# Patient Record
Sex: Male | Born: 1935 | Race: White | Hispanic: No | Marital: Married | Smoking: Never smoker
Health system: Southern US, Community
[De-identification: ages and names within clinical notes are randomized; demographics above are authoritative.]

## PROBLEM LIST (undated history)

## (undated) ENCOUNTER — Emergency Department (HOSPITAL_COMMUNITY): Admission: EM | Payer: Medicare (Managed Care) | Source: Home / Self Care

## (undated) DIAGNOSIS — F0390 Unspecified dementia without behavioral disturbance: Secondary | ICD-10-CM

## (undated) DIAGNOSIS — A4902 Methicillin resistant Staphylococcus aureus infection, unspecified site: Secondary | ICD-10-CM

## (undated) HISTORY — DX: Unspecified dementia, unspecified severity, without behavioral disturbance, psychotic disturbance, mood disturbance, and anxiety: F03.90

## (undated) HISTORY — DX: Methicillin resistant Staphylococcus aureus infection, unspecified site: A49.02

## (undated) HISTORY — PX: OTHER SURGICAL HISTORY: SHX169

---

## 2016-02-26 ENCOUNTER — Emergency Department (HOSPITAL_COMMUNITY)
Admission: EM | Admit: 2016-02-26 | Discharge: 2016-02-26 | Disposition: A | Discharge status: Home / Self Care | Payer: Medicare Other | Attending: Emergency Medicine | Admitting: Emergency Medicine

## 2016-02-26 ENCOUNTER — Encounter (HOSPITAL_COMMUNITY): Payer: Self-pay | Admitting: Emergency Medicine

## 2016-02-26 ENCOUNTER — Emergency Department (HOSPITAL_COMMUNITY): Payer: Medicare Other

## 2016-02-26 DIAGNOSIS — R410 Disorientation, unspecified: Secondary | ICD-10-CM | POA: Diagnosis present

## 2016-02-26 DIAGNOSIS — F0391 Unspecified dementia with behavioral disturbance: Secondary | ICD-10-CM | POA: Insufficient documentation

## 2016-02-26 DIAGNOSIS — R4689 Other symptoms and signs involving appearance and behavior: Secondary | ICD-10-CM

## 2016-02-26 LAB — URINALYSIS, ROUTINE W REFLEX MICROSCOPIC
BILIRUBIN URINE: NEGATIVE
GLUCOSE, UA: NEGATIVE mg/dL
Hgb urine dipstick: NEGATIVE
KETONES UR: NEGATIVE mg/dL
NITRITE: NEGATIVE
PROTEIN: NEGATIVE mg/dL
Specific Gravity, Urine: 1.009 (ref 1.005–1.030)
pH: 7.5 (ref 5.0–8.0)

## 2016-02-26 LAB — URINE MICROSCOPIC-ADD ON

## 2016-02-26 NOTE — ED Notes (Signed)
Patient family members is waiting to speak with Case Worker.

## 2016-02-26 NOTE — Discharge Instructions (Signed)

## 2016-02-26 NOTE — ED Notes (Addendum)
Patient refused blood work and chest x-ray.    MD is aware

## 2016-02-26 NOTE — ED Notes (Signed)
Patient left without discharge papers.  He left with family members

## 2016-02-26 NOTE — Progress Notes (Signed)
CSW was notified by EDP that daughter is interested in placement.  CSW spoke with daughter on the phone. CSW informed her that [facility]. However, CSW will provide resources for daughter to assist with placing patient from community. CSW educated daughter over the phone and explained to her that [facility] is considered outpatient and that a list of facilities will be provided alone with their contact information and [facility] information.  Daughter expressed understanding. She states that [facility] and she will pick pt up. CSW placed community information in the chart.  Trish MageBrittney Azarian Starace, LCSWA [phone] ED CSW 02/26/2016 4:53 PM

## 2016-02-26 NOTE — ED Notes (Signed)
Pt sts " I absolutely, positively refuse to give my blood.  I just had blood done last week.  I don't know why he wants my blood."  Pt insist he is in good health and also sts he will soon "walk out of here, right out the front door soon."  RN notified.

## 2016-02-26 NOTE — ED Notes (Signed)
Daughter:  Claudean SeveranceBeth Ann Ellen  [phone]  Wife:  Alfonse RasCheryl Ann Kimberley [phone]

## 2016-02-26 NOTE — ED Notes (Signed)
Call was placed to wife and daughter for them to return call to us.  Need more medical information
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
## 2016-02-26 NOTE — ED Notes (Signed)
Per EMs patient comes from home where he lives with daughter for being more confused today than his baseline.  Patient has dementia. Patient was more aggressive with her today and would like him evaluated.

## 2016-02-26 NOTE — Progress Notes (Addendum)
Pt with dementia noted Pt informs CM he does not have a pcp Pt states he is from [place] No other family present in pt room with him Pt able to tell CM he has daughters, "beth" and Elnita MaxwellCheryl" but does not know last names  ED RN does not have location of where EMS picked pt up from  Left a list of medicare providers in pt belonging bag in room with pt for zip code [phone]
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
## 2016-02-27 LAB — URINE CULTURE: Culture: NO GROWTH

## 2016-04-16 ENCOUNTER — Encounter: Payer: Self-pay | Admitting: Neurology

## 2016-04-16 ENCOUNTER — Ambulatory Visit (INDEPENDENT_AMBULATORY_CARE_PROVIDER_SITE_OTHER): Payer: Medicare Other | Admitting: Neurology

## 2016-04-16 VITALS — BP 144/82 | HR 72 | Ht 70.0 in | Wt 171.0 lb

## 2016-04-16 DIAGNOSIS — F03918 Unspecified dementia, unspecified severity, with other behavioral disturbance: Secondary | ICD-10-CM | POA: Insufficient documentation

## 2016-04-16 DIAGNOSIS — R410 Disorientation, unspecified: Secondary | ICD-10-CM

## 2016-04-16 DIAGNOSIS — R251 Tremor, unspecified: Secondary | ICD-10-CM

## 2016-04-16 DIAGNOSIS — W19XXXA Unspecified fall, initial encounter: Secondary | ICD-10-CM

## 2016-04-16 DIAGNOSIS — E538 Deficiency of other specified B group vitamins: Secondary | ICD-10-CM

## 2016-04-16 DIAGNOSIS — F05 Delirium due to known physiological condition: Secondary | ICD-10-CM | POA: Diagnosis not present

## 2016-04-16 DIAGNOSIS — R269 Unspecified abnormalities of gait and mobility: Secondary | ICD-10-CM | POA: Diagnosis not present

## 2016-04-16 DIAGNOSIS — F0391 Unspecified dementia with behavioral disturbance: Secondary | ICD-10-CM

## 2016-04-16 DIAGNOSIS — R270 Ataxia, unspecified: Secondary | ICD-10-CM

## 2016-04-16 MED ORDER — DONEPEZIL HCL 10 MG PO TABS: 10.0000 mg | ORAL_TABLET | Freq: Every day | ORAL | 12 refills | Status: AC
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
## 2016-04-16 NOTE — Patient Instructions (Addendum)
Remember to drink plenty of fluid, eat healthy meals and do not skip any meals. Try to eat protein with a every meal and eat a healthy snack such as fruit or nuts in between meals. Try to keep a regular sleep-wake schedule and try to exercise daily, particularly in the form of walking, 20-30 minutes a day, if you can.   As far as your medications are concerned, I would like to suggest: Aricept. Start with 1/2 a tablet then increase to a whole tablet in 2 weeks if no side effects.   As far as diagnostic testing: MRI brain, labs  I would like to see you back in 4-6 months, sooner if we need to. Please call us with any interim questions, concerns, problems, updates or refill requests.   Our phone number is [phone]. We also have an after hours call service for urgent matters and there is a physician on-call for urgent questions. For any emergencies you know to call 911 or go to the nearest emergency room  Donepezil tablets What is this medicine? DONEPEZIL (doe NEP e zil) is used to treat mild to moderate dementia caused by Alzheimer's disease. This medicine may be used for other purposes; ask your health care provider or pharmacist if you have questions. What should I tell my health care provider before I take this medicine? They need to know if you have any of these conditions: -asthma or other lung disease -difficulty passing urine -head injury -heart disease -history of irregular heartbeat -liver disease -seizures (convulsions) -stomach or intestinal disease, ulcers or stomach bleeding -an unusual or allergic reaction to donepezil, other medicines, foods, dyes, or preservatives -pregnant or trying to get pregnant -breast-feeding How should I use this medicine? Take this medicine by mouth with a glass of water. Follow the directions on the prescription label. You may take this medicine with or without food. Take this medicine at regular intervals. This medicine is usually taken before  bedtime. Do not take it more often than directed. Continue to take your medicine even if you feel better. Do not stop taking except on your doctor's advice. If you are taking the 23 mg donepezil tablet, swallow it whole; do not cut, crush, or chew it. Talk to your pediatrician regarding the use of this medicine in children. Special care may be needed. Overdosage: If you think you have taken too much of this medicine contact a poison control center or emergency room at once. NOTE: This medicine is only for you. Do not share this medicine with others. What if I miss a dose? If you miss a dose, take it as soon as you can. If it is almost time for your next dose, take only that dose, do not take double or extra doses. What may interact with this medicine? Do not take this medicine with any of the following medications: -certain medicines for fungal infections like itraconazole, fluconazole, posaconazole, and voriconazole -cisapride -dextromethorphan; quinidine -dofetilide -dronedarone -pimozide -quinidine -thioridazine -ziprasidone This medicine may also interact with the following medications: -antihistamines for allergy, cough and cold -atropine -bethanechol -carbamazepine -certain medicines for bladder problems like oxybutynin, tolterodine -certain medicines for Parkinson's disease like benztropine, trihexyphenidyl -certain medicines for stomach problems like dicyclomine, hyoscyamine -certain medicines for travel sickness like scopolamine -dexamethasone -ipratropium -NSAIDs, medicines for pain and inflammation, like ibuprofen or naproxen -other medicines for Alzheimer's disease -other medicines that prolong the QT interval (cause an abnormal heart rhythm) -phenobarbital -phenytoin -rifampin, rifabutin or rifapentine This list may not describe all  possible interactions. Give your health care provider a list of all the medicines, herbs, non-prescription drugs, or dietary supplements  you use. Also tell them if you smoke, drink alcohol, or use illegal drugs. Some items may interact with your medicine. What should I watch for while using this medicine? Visit your doctor or health care professional for regular checks on your progress. Check with your doctor or health care professional if your symptoms do not get better or if they get worse. You may get drowsy or dizzy. Do not drive, use machinery, or do anything that needs mental alertness until you know how this drug affects you. What side effects may I notice from receiving this medicine? Side effects that you should report to your doctor or health care professional as soon as possible: -allergic reactions like skin rash, itching or hives, swelling of the face, lips, or tongue -changes in vision -feeling faint or lightheaded, falls -problems with balance -redness, blistering, peeling or loosening of the skin, including inside the mouth -slow heartbeat, or palpitations -stomach pain -unusual bleeding or bruising, red or purple spots on the skin -vomiting -weight loss Side effects that usually do not require medical attention (report to your doctor or health care professional if they continue or are bothersome): -diarrhea, especially when starting treatment -headache -indigestion or heartburn -loss of appetite -muscle cramps -nausea This list may not describe all possible side effects. Call your doctor for medical advice about side effects. You may report side effects to FDA at 1-800-FDA-1088. Where should I keep my medicine? Keep out of reach of children. Store at room temperature between 15 and 30 degrees C (59 and 86 degrees F). Throw away any unused medicine after the expiration date. NOTE: This sheet is a summary. It may not cover all possible information. If you have questions about this medicine, talk to your doctor, pharmacist, or health care provider.    2016, Elsevier/Gold Standard. (2014-03-17 07:51:52)

## 2016-04-18 ENCOUNTER — Telehealth: Payer: Self-pay | Admitting: *Deleted

## 2016-04-18 LAB — COMPREHENSIVE METABOLIC PANEL
ALBUMIN: 4.2 g/dL (ref 3.5–4.7)
ALT: 21 IU/L (ref 0–44)
AST: 25 IU/L (ref 0–40)
Albumin/Globulin Ratio: 1.8 (ref 1.2–2.2)
Alkaline Phosphatase: 77 IU/L (ref 39–117)
BUN / CREAT RATIO: 13 (ref 10–24)
BUN: 16 mg/dL (ref 8–27)
Bilirubin Total: 1 mg/dL (ref 0.0–1.2)
CO2: 23 mmol/L (ref 18–29)
CREATININE: 1.22 mg/dL (ref 0.76–1.27)
Calcium: 9.6 mg/dL (ref 8.6–10.2)
Chloride: 103 mmol/L (ref 96–106)
GFR calc non Af Amer: 54 mL/min/{1.73_m2} — ABNORMAL LOW (ref >59)
GFR, EST AFRICAN AMERICAN: 63 mL/min/{1.73_m2} (ref >59)
GLUCOSE: 97 mg/dL (ref 65–99)
Globulin, Total: 2.3 g/dL (ref 1.5–4.5)
Potassium: 5.4 mmol/L — ABNORMAL HIGH (ref 3.5–5.2)
Sodium: 141 mmol/L (ref 134–144)
TOTAL PROTEIN: 6.5 g/dL (ref 6.0–8.5)

## 2016-04-18 LAB — B12 AND FOLATE PANEL
Folate: 17.6 ng/mL (ref >3.0)
VITAMIN B 12: 351 pg/mL (ref 211–946)

## 2016-04-18 LAB — CBC
HEMOGLOBIN: 14.3 g/dL (ref 12.6–17.7)
Hematocrit: 41 % (ref 37.5–51.0)
MCH: 32.4 pg (ref 26.6–33.0)
MCHC: 34.9 g/dL (ref 31.5–35.7)
MCV: 93 fL (ref 79–97)
Platelets: 232 10*3/uL (ref 150–379)
RBC: 4.41 x10E6/uL (ref 4.14–5.80)
RDW: 14 % (ref 12.3–15.4)
WBC: 5.8 10*3/uL (ref 3.4–10.8)

## 2016-04-18 LAB — THYROID PANEL WITH TSH
Free Thyroxine Index: 2.1 (ref 1.2–4.9)
T3 UPTAKE RATIO: 28 % (ref 24–39)
T4 TOTAL: 7.6 ug/dL (ref 4.5–12.0)
TSH: 1.75 u[IU]/mL (ref 0.450–4.500)

## 2016-04-18 LAB — RPR: RPR Ser Ql: NONREACTIVE

## 2016-04-18 NOTE — Telephone Encounter (Signed)
-----   Message from Antonia B Ahern, MD sent at 04/18/2016  6:07 PM EDT ----- Labs unremarkable thanks 

## 2016-04-18 NOTE — Telephone Encounter (Signed)
LVM for wife, Elnita MaxwellCheryl about unremarkable labs per Dr Lucia GaskinsAhern. Gave [facility] phone number if she has further questions.

## 2016-04-26 ENCOUNTER — Emergency Department (HOSPITAL_COMMUNITY): Payer: Medicare Other

## 2016-04-26 ENCOUNTER — Emergency Department (HOSPITAL_COMMUNITY)
Admission: EM | Admit: 2016-04-26 | Discharge: 2016-04-26 | Disposition: A | Discharge status: Home / Self Care | Payer: Medicare Other | Attending: Emergency Medicine | Admitting: Emergency Medicine

## 2016-04-26 ENCOUNTER — Encounter (HOSPITAL_COMMUNITY): Payer: Self-pay | Admitting: Emergency Medicine

## 2016-04-26 DIAGNOSIS — F0390 Unspecified dementia without behavioral disturbance: Secondary | ICD-10-CM | POA: Insufficient documentation

## 2016-04-26 DIAGNOSIS — S4991XA Unspecified injury of right shoulder and upper arm, initial encounter: Secondary | ICD-10-CM | POA: Diagnosis present

## 2016-04-26 DIAGNOSIS — Y999 Unspecified external cause status: Secondary | ICD-10-CM | POA: Insufficient documentation

## 2016-04-26 DIAGNOSIS — Y92410 Unspecified street and highway as the place of occurrence of the external cause: Secondary | ICD-10-CM | POA: Diagnosis not present

## 2016-04-26 DIAGNOSIS — W19XXXA Unspecified fall, initial encounter: Secondary | ICD-10-CM

## 2016-04-26 DIAGNOSIS — Y9301 Activity, walking, marching and hiking: Secondary | ICD-10-CM | POA: Diagnosis not present

## 2016-04-26 DIAGNOSIS — S43004A Unspecified dislocation of right shoulder joint, initial encounter: Secondary | ICD-10-CM

## 2016-04-26 DIAGNOSIS — S43031A Inferior subluxation of right humerus, initial encounter: Secondary | ICD-10-CM | POA: Diagnosis not present

## 2016-04-26 DIAGNOSIS — W0110XA Fall on same level from slipping, tripping and stumbling with subsequent striking against unspecified object, initial encounter: Secondary | ICD-10-CM | POA: Diagnosis not present

## 2016-04-26 LAB — CBC WITH DIFFERENTIAL/PLATELET
BASOS PCT: 1 %
Basophils Absolute: 0 10*3/uL (ref 0.0–0.1)
EOS ABS: 0.2 10*3/uL (ref 0.0–0.7)
Eosinophils Relative: 4 %
HCT: 38.5 % — ABNORMAL LOW (ref 39.0–52.0)
HEMOGLOBIN: 13.5 g/dL (ref 13.0–17.0)
LYMPHS PCT: 34 %
Lymphs Abs: 2.2 10*3/uL (ref 0.7–4.0)
MCH: 32.4 pg (ref 26.0–34.0)
MCHC: 35.1 g/dL (ref 30.0–36.0)
MCV: 92.3 fL (ref 78.0–100.0)
Monocytes Absolute: 0.6 10*3/uL (ref 0.1–1.0)
Monocytes Relative: 10 %
NEUTROS ABS: 3.3 10*3/uL (ref 1.7–7.7)
Neutrophils Relative %: 51 %
PLATELETS: 230 10*3/uL (ref 150–400)
RBC: 4.17 MIL/uL — ABNORMAL LOW (ref 4.22–5.81)
RDW: 13.1 % (ref 11.5–15.5)
WBC: 6.4 10*3/uL (ref 4.0–10.5)

## 2016-04-26 LAB — COMPREHENSIVE METABOLIC PANEL
ALBUMIN: 3.6 g/dL (ref 3.5–5.0)
ALT: 27 U/L (ref 17–63)
ANION GAP: 8 (ref 5–15)
AST: 41 U/L (ref 15–41)
Alkaline Phosphatase: 65 U/L (ref 38–126)
BUN: 14 mg/dL (ref 6–20)
CALCIUM: 9 mg/dL (ref 8.9–10.3)
CHLORIDE: 111 mmol/L (ref 101–111)
CO2: 22 mmol/L (ref 22–32)
Creatinine, Ser: 1.31 mg/dL — ABNORMAL HIGH (ref 0.61–1.24)
GFR calc non Af Amer: 49 mL/min — ABNORMAL LOW (ref >60)
GFR, EST AFRICAN AMERICAN: 56 mL/min — AB (ref >60)
GLUCOSE: 133 mg/dL — AB (ref 65–99)
POTASSIUM: 3.8 mmol/L (ref 3.5–5.1)
SODIUM: 141 mmol/L (ref 135–145)
Total Bilirubin: 2 mg/dL — ABNORMAL HIGH (ref 0.3–1.2)
Total Protein: 5.7 g/dL — ABNORMAL LOW (ref 6.5–8.1)

## 2016-04-26 LAB — I-STAT TROPONIN, ED: Troponin i, poc: 0 ng/mL (ref 0.00–0.08)

## 2016-04-26 LAB — ETHANOL

## 2016-04-26 MED ORDER — ONDANSETRON HCL 4 MG/2ML IJ SOLN
4.0000 mg | Freq: Once | INTRAMUSCULAR | Status: AC
Start: 2016-04-26 — End: 2016-04-26
  Administered 2016-04-26: 4 mg via INTRAVENOUS
  Filled 2016-04-26: qty 2

## 2016-04-26 MED ORDER — HYDROMORPHONE HCL 1 MG/ML IJ SOLN
1.0000 mg | Freq: Once | INTRAMUSCULAR | Status: AC
  Administered 2016-04-26: 1 mg via INTRAVENOUS
  Filled 2016-04-26: qty 1

## 2016-04-26 MED ORDER — HYDROCODONE-ACETAMINOPHEN 5-325 MG PO TABS: 1.0000 | ORAL_TABLET | ORAL | 0 refills | Status: DC | PRN

## 2016-04-26 MED ORDER — PROPOFOL 10 MG/ML IV BOLUS
100.0000 mg | Freq: Once | INTRAVENOUS | Status: AC
  Administered 2016-04-26: 50 mg via INTRAVENOUS
  Filled 2016-04-26: qty 20

## 2016-04-26 MED ORDER — PROPOFOL 10 MG/ML IV BOLUS
INTRAVENOUS | Status: AC | PRN
  Administered 2016-04-26: 50 mg via INTRAVENOUS

## 2016-04-26 MED ORDER — DIAZEPAM 5 MG/ML IJ SOLN
5.0000 mg | Freq: Once | INTRAMUSCULAR | Status: DC
Start: 2016-04-26 — End: 2016-04-26
  Filled 2016-04-26: qty 2

## 2016-04-26 MED ORDER — LIDOCAINE HCL 2 % IJ SOLN
10.0000 mL | Freq: Once | INTRAMUSCULAR | Status: DC
  Filled 2016-04-26: qty 20

## 2016-04-26 NOTE — ED Triage Notes (Signed)
Pt fell outside while walking to a neighbors house per pt. Pt arrives with deformity of upper right arm and pain. Pt has sensation intact to right hand with limited movement of fingers. Doppler pulse present in right radial.

## 2016-04-26 NOTE — ED Notes (Signed)
Family at bedside leaving and would like to be contacted for any updates Elnita MaxwellCheryl [phone]

## 2016-04-26 NOTE — Discharge Instructions (Signed)
Take motrin for pain.   Apply ice to area.   Take vicodin for severe pain.   Use sling at all times for a week until you see ortho.  Call Dr. Warren DanesXu's office for appointment.   Return to ER if you have severe pain, another dislocation.

## 2016-04-26 NOTE — ED Provider Notes (Addendum)
[facility]-EMERGENCY DEPT Provider Note   CSN: [id number] Arrival date & time: 04/26/16  1211     History   Chief Complaint Chief Complaint  Patient presents with  . Fall  . Shoulder Injury    HPI Rochelle Nephew is a 82 y.o. male hx of dementia here with fall. Patient states that he was walking outside and tripped over the curb and hit the right arm and his head. He states that he Did hit his head and is complaining of right upper arm pain and forearm pain. Denies passing out. Denies any chest pain or abdominal pain. States it is otherwise healthy and only has a history of dementia. Denies any alcohol or drug use.   The history is provided by the patient.    Past Medical History:  Diagnosis Date  . Dementia   . MRSA infection     Patient Active Problem List   Diagnosis Date Noted  . Dementia with behavioral disturbance 04/16/2016    Past Surgical History:  Procedure Laterality Date  . MRSa     2006/2007  . OTHER SURGICAL HISTORY     Dicverticulitis       Home Medications    Prior to Admission medications   Medication Sig Start Date End Date Taking? Authorizing Provider  donepezil (ARICEPT) 10 MG tablet Take 1 tablet (10 mg total) by mouth at bedtime. 04/16/16   Anson Fret, MD  levocetirizine (XYZAL) 5 MG tablet Take 5 mg by mouth daily. 04/08/16   Historical Provider, MD  triamcinolone cream (KENALOG) 0.1 % Apply 1 application topically daily. 03/23/16   Historical Provider, MD    Family History Family History  Problem Relation Age of Onset  . Glaucoma Mother     Social History Social History  Substance Use Topics  . Smoking status: Never Smoker  . Smokeless tobacco: Never Used  . Alcohol use No     Allergies   Review of patient's allergies indicates no known allergies.   Review of Systems Review of Systems  Musculoskeletal:       R upper arm pain   Neurological: Positive for headaches.  All other systems reviewed and are negative.    Physical  Exam Updated Vital Signs BP 155/90   Pulse 78   Resp 18   Ht 5\' 10"  (1.778 m)   Wt 171 lb (77.6 kg)   SpO2 97%   BMI 24.54 kg/m   Physical Exam  Constitutional:  Uncomfortable   HENT:  Head: Normocephalic.  Eyes: EOM are normal. Pupils are equal, round, and reactive to light.  Neck: Normal range of motion. Neck supple.  Towel roll in place   Cardiovascular: Normal rate, regular rhythm and normal heart sounds.   Pulmonary/Chest: Effort normal and breath sounds normal. No respiratory distress. He has no wheezes. He has no rales.  Abdominal: Soft. Bowel sounds are normal.  Musculoskeletal:  R proximal humerus deformity, mild  R forearm tenderness, no obvious deformity. Able to hand grasp. 2+ pulses.   Neurological: He is alert.  Skin: Skin is warm.  Psychiatric: He has a normal mood and affect.  Nursing note and vitals reviewed.    ED Treatments / Results  Labs (all labs ordered are listed, but only abnormal results are displayed) Labs Reviewed  CBC WITH DIFFERENTIAL/PLATELET - Abnormal; Notable for the following:       Result Value   RBC 4.17 (*)    HCT 38.5 (*)    All other components within normal  limits  COMPREHENSIVE METABOLIC PANEL - Abnormal; Notable for the following:    Glucose, Bld 133 (*)    Creatinine, Ser 1.31 (*)    Total Protein 5.7 (*)    Total Bilirubin 2.0 (*)    GFR calc non Af Amer 49 (*)    GFR calc Af Amer 56 (*)    All other components within normal limits  ETHANOL  I-STAT TROPOININ, ED    EKG  EKG Interpretation  Date/Time:  Friday April 26 2016 12:15:33 EDT Ventricular Rate:  65 PR Interval:    QRS Duration: 115 QT Interval:  437 QTC Calculation: 455 R Axis:   46 Text Interpretation:  Sinus rhythm Prolonged PR interval Nonspecific intraventricular conduction delay Low voltage, precordial leads Borderline T abnormalities, anterior leads No previous ECGs available Confirmed by Candia Kingsbury  MD, Jla Reynolds ([id number]) on 04/26/2016 1:38:33 PM        Radiology Dg Chest 1 View  Result Date: 04/26/2016 CLINICAL DATA:  Fall, right humerus pain EXAM: CHEST 1 VIEW COMPARISON:  None. FINDINGS: Cardiomediastinal silhouette is unremarkable. No infiltrate or pulmonary edema. No pneumothorax. There is anterior subluxation of right humerus from glenohumeral joint. IMPRESSION: No infiltrate or pulmonary edema. Anterior subluxation of right humeral head. Electronically Signed   By: Natasha MeadLiviu  Pop M.D.   On: 04/26/2016 15:35   Dg Pelvis 1-2 Views  Result Date: 04/26/2016 CLINICAL DATA:  Fall. EXAM: PELVIS - 1-2 VIEW COMPARISON:  None. FINDINGS: There is no evidence of pelvic fracture or diastasis. No pelvic bone lesions are seen. Bilateral hip and sacroiliac joints appear normal. IMPRESSION: Normal pelvis. Electronically Signed   By: Lupita RaiderJames  Green Jr, M.D.   On: 04/26/2016 14:57   Dg Shoulder 1v Right  Result Date: 04/26/2016 CLINICAL DATA:  Status post reduction EXAM: RIGHT SHOULDER - 1 VIEW COMPARISON:  Film from earlier in the same day FINDINGS: The previously seen dislocation has been reduced. Degenerative changes of the acromioclavicular joint are noted. No definitive fracture is seen. IMPRESSION: Status post reduction of previous dislocation. Electronically Signed   By: Alcide CleverMark  Lukens M.D.   On: 04/26/2016 15:41   Dg Forearm Right  Result Date: 04/26/2016 CLINICAL DATA:  Pt c/o proximal right humerus pain and generalized right forearm pain after falling outside of his neighbor's house today; pt does not remember how he fell. No hx of prior injuries to the humerus and forearm. Best obtainable im.*comment was truncated* EXAM: RIGHT FOREARM - 2 VIEW COMPARISON:  None. FINDINGS: Ten No fracture of the radius or ulna. The elbow joint and the wrist joint appear normal on two views. IMPRESSION: No fracture or dislocation. Electronically Signed   By: Genevive BiStewart  Edmunds M.D.   On: 04/26/2016 14:56   Ct Head Wo Contrast  Result Date: 04/26/2016 CLINICAL DATA:  Fall  found down in road patient does not know anything keeps asking same questions over. Pt moved during c-spine exam, repeated images.; EXAM: CT HEAD WITHOUT CONTRAST CT CERVICAL SPINE WITHOUT CONTRAST TECHNIQUE: Multidetector CT imaging of the head and cervical spine was performed following the standard protocol without intravenous contrast. Multiplanar CT image reconstructions of the cervical spine were also generated. COMPARISON:  None. FINDINGS: CT HEAD FINDINGS Brain: No acute intracranial hemorrhage. No focal mass lesion. No CT evidence of acute infarction. No midline shift or mass effect. No hydrocephalus. Basilar cisterns are patent. There are periventricular and subcortical white matter hypodensities. Generalized cortical atrophy. Vascular: Vascular calcification of the circle Willis vessels. Skull: No fracture Sinuses/Orbits: Paranasal sinuses  and mastoid air cells are clear. Orbits are clear. Other: No extracranial trauma CT CERVICAL SPINE FINDINGS Alignment: Normal alignment of the cervical vertebral bodies. Skull base and vertebrae: No loss of vertebral body height or disc height. Normal facet articulation. Normal craniocervical junction. Soft tissues and spinal canal: No epidural or paraspinal hematoma. Disc levels: Disc osteophytic disease at C5 through C7 with endplate spurring. Upper chest: Not imaged Other: None IMPRESSION: 1. No intracranial trauma. 2. Atrophy white matter microvascular disease. 3. No cervical spine fracture. 4. Mild disc osteophytic disease. Electronically Signed   By: Genevive Bi M.D.   On: 04/26/2016 14:33   Ct Cervical Spine Wo Contrast  Result Date: 04/26/2016 CLINICAL DATA:  Fall found down in road patient does not know anything keeps asking same questions over. Pt moved during c-spine exam, repeated images.; EXAM: CT HEAD WITHOUT CONTRAST CT CERVICAL SPINE WITHOUT CONTRAST TECHNIQUE: Multidetector CT imaging of the head and cervical spine was performed following the  standard protocol without intravenous contrast. Multiplanar CT image reconstructions of the cervical spine were also generated. COMPARISON:  None. FINDINGS: CT HEAD FINDINGS Brain: No acute intracranial hemorrhage. No focal mass lesion. No CT evidence of acute infarction. No midline shift or mass effect. No hydrocephalus. Basilar cisterns are patent. There are periventricular and subcortical white matter hypodensities. Generalized cortical atrophy. Vascular: Vascular calcification of the circle Willis vessels. Skull: No fracture Sinuses/Orbits: Paranasal sinuses and mastoid air cells are clear. Orbits are clear. Other: No extracranial trauma CT CERVICAL SPINE FINDINGS Alignment: Normal alignment of the cervical vertebral bodies. Skull base and vertebrae: No loss of vertebral body height or disc height. Normal facet articulation. Normal craniocervical junction. Soft tissues and spinal canal: No epidural or paraspinal hematoma. Disc levels: Disc osteophytic disease at C5 through C7 with endplate spurring. Upper chest: Not imaged Other: None IMPRESSION: 1. No intracranial trauma. 2. Atrophy white matter microvascular disease. 3. No cervical spine fracture. 4. Mild disc osteophytic disease. Electronically Signed   By: Genevive Bi M.D.   On: 04/26/2016 14:33   Dg Humerus Right  Result Date: 04/26/2016 CLINICAL DATA:  Proximal right humerus pain.  Fall. EXAM: RIGHT HUMERUS - 2+ VIEW COMPARISON:  None. FINDINGS: There is been dislocation of the glenohumeral joint. The humeral head is inferior to the glenoid. Based on the projections provided is difficult to ascertain whether not this is an anterior posterior dislocation. A transscapular Y view may be helpful to determine location of the humeral head with respect to the glenoid. IMPRESSION: 1. Inferior dislocation of the glenohumeral joint. Electronically Signed   By: Signa Kell M.D.   On: 04/26/2016 14:57    Procedures Reduction of dislocation Date/Time:  04/26/2016 3:42 PM Performed by: Charlynne Pander Authorized by: Charlynne Pander  Consent: Verbal consent not obtained. Risks and benefits: risks, benefits and alternatives were discussed Consent given by: patient Patient understanding: patient does not state understanding of the procedure being performed Patient consent: the patient's understanding of the procedure does not match consent given Procedure consent: procedure consent does not match procedure scheduled Relevant documents: relevant documents not present or verified Test results: test results not available Required items: required blood products, implants, devices, and special equipment available Patient identity confirmed: verbally with patient Time out: Immediately prior to procedure a "time out" was called to verify the correct patient, procedure, equipment, support staff and site/side marked as required. Local anesthesia used: no  Anesthesia: Local anesthesia used: no  Sedation: Patient sedated: yes Sedatives: propofol  Vitals: Vital signs were monitored during sedation. Patient tolerance: Patient tolerated the procedure well with no immediate complications Comments: Traction and counter traction performed. No complications. Confirmed by xrays.     (including critical care time)  Medications Ordered in ED Medications  lidocaine (XYLOCAINE) 2 % (with pres) injection 200 mg (200 mg Other Not Given 04/26/16 1543)  HYDROmorphone (DILAUDID) injection 1 mg (1 mg Intravenous Given 04/26/16 1220)  ondansetron (ZOFRAN) injection 4 mg (4 mg Intravenous Given 04/26/16 1331)  propofol (DIPRIVAN) 10 mg/mL bolus/IV push 100 mg (50 mg Intravenous Given by Other 04/26/16 1542)  ondansetron (ZOFRAN) injection 4 mg (4 mg Intravenous Given 04/26/16 1521)  propofol (DIPRIVAN) 10 mg/mL bolus/IV push (50 mg Intravenous Given 04/26/16 1528)     Initial Impression / Assessment and Plan / ED Course  I have reviewed the triage vital signs and the  nursing notes.  Pertinent labs & imaging results that were available during my care of the patient were reviewed by me and considered in my medical decision making (see chart for details).  Clinical Course    Arcadio Cope is a 82 y.o. male here with fall. No syncope. Will get labs, CT head/neck, xrays.   3:50 PM  Xray showed R shoulder inferior dislocation. I reduced R shoulder under conscious sedation. Xray showed successful reduction. Shoulder immobilizer placed. Will give ortho follow up. Awake alert now. Able to hand grasp, neurovascular intact. States that has no place to go and he is visiting. Case management to give him list of shelters.    Final Clinical Impressions(s) / ED Diagnoses   Final diagnoses:  Fall  Shoulder dislocation, right, initial encounter    New Prescriptions New Prescriptions   No medications on file     Charlynne Pander, MD 04/26/16 1552    Charlynne Pander, MD 04/26/16 1558

## 2016-04-26 NOTE — ED Notes (Addendum)
Pt belongings (pants and wallet) given to daughter.

## 2016-04-26 NOTE — Progress Notes (Signed)
Orthopedic Tech Progress Note Patient Details:  Jacob Rich 04/13/1931 [id number]  Ortho Devices Type of Ortho Device: Shoulder immobilizer Ortho Device/Splint Location: RUE Ortho Device/Splint Interventions: Ordered, Application   Jacob Rich, Jacob Rich 04/26/2016, 3:41 PM

## 2016-05-03 ENCOUNTER — Emergency Department (HOSPITAL_COMMUNITY)
Admission: EM | Admit: 2016-05-03 | Discharge: 2016-05-03 | Disposition: A | Discharge status: Home / Self Care | Payer: Medicare Other | Attending: Emergency Medicine | Admitting: Emergency Medicine

## 2016-05-03 ENCOUNTER — Emergency Department (HOSPITAL_COMMUNITY): Payer: Medicare Other

## 2016-05-03 ENCOUNTER — Encounter (HOSPITAL_COMMUNITY): Payer: Self-pay | Admitting: Emergency Medicine

## 2016-05-03 DIAGNOSIS — N390 Urinary tract infection, site not specified: Secondary | ICD-10-CM | POA: Insufficient documentation

## 2016-05-03 DIAGNOSIS — F0390 Unspecified dementia without behavioral disturbance: Secondary | ICD-10-CM | POA: Diagnosis not present

## 2016-05-03 DIAGNOSIS — Z791 Long term (current) use of non-steroidal anti-inflammatories (NSAID): Secondary | ICD-10-CM | POA: Diagnosis not present

## 2016-05-03 DIAGNOSIS — R8290 Unspecified abnormal findings in urine: Secondary | ICD-10-CM | POA: Diagnosis present

## 2016-05-03 LAB — URINALYSIS, ROUTINE W REFLEX MICROSCOPIC
Glucose, UA: NEGATIVE mg/dL
Hgb urine dipstick: NEGATIVE
Ketones, ur: 15 mg/dL — AB
NITRITE: NEGATIVE
Protein, ur: NEGATIVE mg/dL
SPECIFIC GRAVITY, URINE: 1.026 (ref 1.005–1.030)
pH: 6 (ref 5.0–8.0)

## 2016-05-03 LAB — COMPREHENSIVE METABOLIC PANEL
ALBUMIN: 3.9 g/dL (ref 3.5–5.0)
ALK PHOS: 61 U/L (ref 38–126)
ALT: 31 U/L (ref 17–63)
ANION GAP: 7 (ref 5–15)
AST: 35 U/L (ref 15–41)
BUN: 25 mg/dL — AB (ref 6–20)
CALCIUM: 9.3 mg/dL (ref 8.9–10.3)
CO2: 25 mmol/L (ref 22–32)
CREATININE: 1.42 mg/dL — AB (ref 0.61–1.24)
Chloride: 109 mmol/L (ref 101–111)
GFR calc non Af Amer: 44 mL/min — ABNORMAL LOW (ref >60)
GFR, EST AFRICAN AMERICAN: 51 mL/min — AB (ref >60)
GLUCOSE: 145 mg/dL — AB (ref 65–99)
Potassium: 4.1 mmol/L (ref 3.5–5.1)
SODIUM: 141 mmol/L (ref 135–145)
TOTAL PROTEIN: 6.6 g/dL (ref 6.5–8.1)
Total Bilirubin: 2.2 mg/dL — ABNORMAL HIGH (ref 0.3–1.2)

## 2016-05-03 LAB — CBC WITH DIFFERENTIAL/PLATELET
Basophils Absolute: 0 10*3/uL (ref 0.0–0.1)
Basophils Relative: 0 %
EOS ABS: 0.2 10*3/uL (ref 0.0–0.7)
Eosinophils Relative: 3 %
HCT: 37.9 % — ABNORMAL LOW (ref 39.0–52.0)
HEMOGLOBIN: 13.4 g/dL (ref 13.0–17.0)
LYMPHS ABS: 1.2 10*3/uL (ref 0.7–4.0)
Lymphocytes Relative: 18 %
MCH: 33.1 pg (ref 26.0–34.0)
MCHC: 35.4 g/dL (ref 30.0–36.0)
MCV: 93.6 fL (ref 78.0–100.0)
Monocytes Absolute: 0.5 10*3/uL (ref 0.1–1.0)
Monocytes Relative: 8 %
NEUTROS PCT: 71 %
Neutro Abs: 4.5 10*3/uL (ref 1.7–7.7)
Platelets: 273 10*3/uL (ref 150–400)
RBC: 4.05 MIL/uL — AB (ref 4.22–5.81)
RDW: 12.9 % (ref 11.5–15.5)
WBC: 6.3 10*3/uL (ref 4.0–10.5)

## 2016-05-03 LAB — URINE MICROSCOPIC-ADD ON: RBC / HPF: NONE SEEN RBC/hpf (ref 0–5)

## 2016-05-03 MED ORDER — CEPHALEXIN 500 MG PO CAPS: 500.0000 mg | ORAL_CAPSULE | Freq: Three times a day (TID) | ORAL | 0 refills | Status: AC

## 2016-05-03 NOTE — ED Notes (Signed)
Cannot reach family member to assess reason for visit.

## 2016-05-03 NOTE — ED Notes (Signed)
MD spoke with family.  New orders given.

## 2016-05-03 NOTE — ED Notes (Signed)
Bed: [id number] Expected date:  Expected time:  Means of arrival:  Comments: Dark urine

## 2016-05-03 NOTE — ED Notes (Signed)
Pt reminded of need for urine. Pt states " I only pee twice a day and I already done that for the day."

## 2016-05-03 NOTE — ED Triage Notes (Signed)
Pt's wife called [facility] reporting dark urine since yesterday.  Pt has hx of UTIs and dementia.  Pt A&O at baseline.  Pt's wife gave him Vicodin 5/325 mg 1hr before [facility] arrived.  Pt denies N/V, pain or burning with urination, frequency, retention, or abdominal/back/pelvic pain.  Pt reports that he is worried about pain in his right hip which he isn't sure whether it was damaged 'during the accident along with my shoulder'.  Pt's wife reports that he fell and dislocated his shoulder a few weeks ago.  Pt's right arm is in a sling.

## 2016-05-03 NOTE — ED Notes (Signed)
Will obtain VS when pt ride arrives

## 2016-05-03 NOTE — ED Notes (Signed)
Pt given sandwhich 

## 2016-05-03 NOTE — ED Provider Notes (Signed)
[facility]-EMERGENCY DEPT Provider Note   CSN: [id number] Arrival date & time: 05/03/16  1317     History   Chief Complaint Chief Complaint  Patient presents with  . Dysuria    HPI Jacob Rich is a 82 y.o. male.  HPI   Patient reports not sure why he is here.  Denies pain.  No pain with urination. Reports "I'm healthy". Denies abdominal pain, chest pain, shortness of breath. Reports "i'm in perfect condition!"  Wife brought him in for concern for dark urine.   1 week ago fell and dislocated shoulder Not walking for last few days, has been since yesterday having hip pain (has been bothering him off and on for years) in bed until 1PM, laying in bed, not wanting to walk at home.  Urine appeared dark. Looked pale at the time.  Wife concerned that he has UTI  Past Medical History:  Diagnosis Date  . Dementia   . MRSA infection     Patient Active Problem List   Diagnosis Date Noted  . Dementia with behavioral disturbance 04/16/2016    Past Surgical History:  Procedure Laterality Date  . MRSa     2006/2007  . OTHER SURGICAL HISTORY     Dicverticulitis       Home Medications    Prior to Admission medications   Medication Sig Start Date End Date Taking? Authorizing Provider  cephALEXin (KEFLEX) 500 MG capsule Take 1 capsule (500 mg total) by mouth 3 (three) times daily. 05/03/16 05/10/16  Alvira MondayErin Gaelyn Tukes, MD  donepezil (ARICEPT) 10 MG tablet Take 1 tablet (10 mg total) by mouth at bedtime. 04/16/16   Anson FretAntonia B Ahern, MD  HYDROcodone-acetaminophen (NORCO/VICODIN) 5-325 MG tablet Take 1 tablet by mouth every 4 (four) hours as needed. 04/26/16   Charlynne Panderavid Hsienta Yao, MD  levocetirizine (XYZAL) 5 MG tablet Take 5 mg by mouth daily. 04/08/16   Historical Provider, MD  triamcinolone cream (KENALOG) 0.1 % Apply 1 application topically daily. 03/23/16   Historical Provider, MD    Family History Family History  Problem Relation Age of Onset  . Glaucoma Mother     Social  History Social History  Substance Use Topics  . Smoking status: Never Smoker  . Smokeless tobacco: Never Used  . Alcohol use No     Allergies   Review of patient's allergies indicates no known allergies.   Review of Systems Review of Systems  Unable to perform ROS: Dementia  Constitutional: Positive for fatigue (per wife). Negative for fever.  HENT: Negative for sore throat.   Eyes: Negative for visual disturbance.  Respiratory: Negative for shortness of breath.   Cardiovascular: Negative for chest pain.  Gastrointestinal: Negative for abdominal pain.  Genitourinary: Negative for difficulty urinating. Hematuria: dark urine per wife.  Musculoskeletal: Positive for arthralgias (per wife). Negative for neck stiffness.  Skin: Negative for rash.  Neurological: Negative for syncope and headaches.     Physical Exam Updated Vital Signs BP 129/63 (BP Location: Left Arm)   Pulse (!) 51   Temp 97.6 F (36.4 C) (Oral)   Resp 14   Ht 5\' 10"  (1.778 m)   Wt 171 lb (77.6 kg)   SpO2 100%   BMI 24.54 kg/m   Physical Exam  Constitutional: He appears well-developed and well-nourished. No distress.  talkative  HENT:  Head: Normocephalic and atraumatic.  Eyes: Conjunctivae and EOM are normal.  Neck: Normal range of motion.  Cardiovascular: Normal rate, regular rhythm, normal heart sounds and intact distal  pulses.  Exam reveals no gallop and no friction rub.   No murmur heard. Pulmonary/Chest: Effort normal and breath sounds normal. No respiratory distress. He has no wheezes. He has no rales.  Abdominal: Soft. He exhibits no distension. There is no tenderness. There is no guarding.  Musculoskeletal: He exhibits no edema.  Neurological: He is alert.  Knows name, Reports in Like an office building, not sure where, reports year is the 13th.   Skin: Skin is warm and dry. He is not diaphoretic.  Nursing note and vitals reviewed.    ED Treatments / Results  Labs (all labs ordered  are listed, but only abnormal results are displayed) Labs Reviewed  URINALYSIS, ROUTINE W REFLEX MICROSCOPIC (NOT AT [facility]) - Abnormal; Notable for the following:       Result Value   Color, Urine AMBER (*)    Bilirubin Urine SMALL (*)    Ketones, ur 15 (*)    Leukocytes, UA MODERATE (*)    All other components within normal limits  CBC WITH DIFFERENTIAL/PLATELET - Abnormal; Notable for the following:    RBC 4.05 (*)    HCT 37.9 (*)    All other components within normal limits  COMPREHENSIVE METABOLIC PANEL - Abnormal; Notable for the following:    Glucose, Bld 145 (*)    BUN 25 (*)    Creatinine, Ser 1.42 (*)    Total Bilirubin 2.2 (*)    GFR calc non Af Amer 44 (*)    GFR calc Af Amer 51 (*)    All other components within normal limits  URINE MICROSCOPIC-ADD ON - Abnormal; Notable for the following:    Squamous Epithelial / LPF 0-5 (*)    Bacteria, UA RARE (*)    Casts HYALINE CASTS (*)    All other components within normal limits    EKG  EKG Interpretation None       Radiology Dg Hip Unilat W Or Wo Pelvis 2-3 Views Right  Result Date: 05/03/2016 CLINICAL DATA:  Right hip pain EXAM: DG HIP (WITH OR WITHOUT PELVIS) 2-3V RIGHT COMPARISON:  None. FINDINGS: There is no evidence of hip fracture or dislocation. There is no evidence of arthropathy or other focal bone abnormality. Mild osteoarthritis of the sacroiliac joints. IMPRESSION: Negative. Electronically Signed   By: Elige Ko   On: 05/03/2016 16:18    Procedures Procedures (including critical care time)  Medications Ordered in ED Medications - No data to display   Initial Impression / Assessment and Plan / ED Course  I have reviewed the triage vital signs and the nursing notes.  Pertinent labs & imaging results that were available during my care of the patient were reviewed by me and considered in my medical decision making (see chart for details).  Clinical Course   82year-old male  With a history of  dementiapresents with concern fordark urine.. Wife reports that he is been fatigued at home.In the emergency department,patient appears have good and energy, is standing up and walking around the room, and have no complaints.  Her wife, patient complained of right hip pain at home, and x-raywas obtained which showed no evidence of fracture. Labs were obtained given wife description of fatigue at homeand showed no acute findings. Urinalysis does show concern for possible UTI.Will treat with keflex as an outpatient. Patient discharged in stable condition with understanding of reasons to return.   Final Clinical Impressions(s) / ED Diagnoses   Final diagnoses:  UTI (lower urinary tract infection)  New Prescriptions Discharge Medication List as of 05/03/2016  6:22 PM    START taking these medications   Details  cephALEXin (KEFLEX) 500 MG capsule Take 1 capsule (500 mg total) by mouth 3 (three) times daily., Starting Fri 05/03/2016, Until Fri 05/10/2016, Print         Alvira Monday, MD 05/04/16 1154

## 2016-05-03 NOTE — ED Notes (Signed)
Spoke with "wife".  Notified pt is d/c and needs ride home.

## 2016-05-03 NOTE — ED Notes (Signed)
Tried to obtain VS he stated " why, I'm not a pt."

## 2016-05-03 NOTE — ED Notes (Signed)
Pt given urinal and made aware of need for urine specimen 

## 2016-05-08 ENCOUNTER — Other Ambulatory Visit: Payer: Medicare Other

## 2016-05-15 ENCOUNTER — Other Ambulatory Visit: Payer: Medicare Other

## 2016-05-29 ENCOUNTER — Other Ambulatory Visit: Payer: Medicare Other

## 2016-06-06 ENCOUNTER — Ambulatory Visit (INDEPENDENT_AMBULATORY_CARE_PROVIDER_SITE_OTHER): Payer: Medicare Other | Admitting: Orthopaedic Surgery

## 2016-06-10 ENCOUNTER — Ambulatory Visit (INDEPENDENT_AMBULATORY_CARE_PROVIDER_SITE_OTHER): Payer: Medicare Other | Admitting: Orthopaedic Surgery

## 2016-06-10 DIAGNOSIS — S4291XD Fracture of right shoulder girdle, part unspecified, subsequent encounter for fracture with routine healing: Secondary | ICD-10-CM

## 2016-06-10 DIAGNOSIS — S4290XD Fracture of unspecified shoulder girdle, part unspecified, subsequent encounter for fracture with routine healing: Secondary | ICD-10-CM | POA: Insufficient documentation

## 2016-06-10 NOTE — Progress Notes (Signed)
   Office Visit Note   Patient: Jacob Rich           Date of Birth: 10/30/1930           MRN: [id number] Visit Date: 06/10/2016              Requested by: No referring provider defined for this encounter. PCP: No PCP Per Patient   Assessment & Plan: Visit Diagnoses:  1. Traumatic closed displaced fracture of right shoulder with anterior dislocation with routine healing, subsequent encounter     Plan:  - continue PT for 6 weeks - needs 3 in 1 commode - will try to treat nonop - consider MRI if not significantly better  Follow-Up Instructions: Return in about 6 weeks (around 07/22/2016) for recheck shoulder.   Orders:  Orders Placed This Encounter  Procedures  . DME Bedside commode   No orders of the defined types were placed in this encounter.     Procedures: No procedures performed   Clinical Data: No additional findings.   Subjective: Chief Complaint  Patient presents with  . Right Shoulder - Follow-up    3 wk f/u for right shoulder dislocation.  Doing PT twice a week.  Doing better per wife.  Pain is 5/10, does not radiate.    Review of Systems   Objective: Vital Signs: There were no vitals taken for this visit.  Physical Exam  Right Shoulder Exam   Comments:  Able to actively abduct 20 degrees but severely limited by pain.        Specialty Comments:  No specialty comments available.  Imaging: No results found.   PMFS History: Patient Active Problem List   Diagnosis Date Noted  . Traumatic closed displaced fracture of shoulder with anterior dislocation with routine healing 06/10/2016  . Dementia with behavioral disturbance 04/16/2016   Past Medical History:  Diagnosis Date  . Dementia   . MRSA infection     Family History  Problem Relation Age of Onset  . Glaucoma Mother     Past Surgical History:  Procedure Laterality Date  . MRSa     2006/2007  . OTHER SURGICAL HISTORY     Dicverticulitis   Social History    Occupational History  . Retired    Social History Main Topics  . Smoking status: Never Smoker  . Smokeless tobacco: Never Used  . Alcohol use No  . Drug use: No  . Sexual activity: Not on file

## 2016-06-21 ENCOUNTER — Telehealth: Payer: Self-pay | Admitting: Neurology

## 2016-06-21 NOTE — Telephone Encounter (Signed)
Kara Meadmma, she can request my last office note in it I wrote " Today's history and physical demonstrated very substantial and measurable cognitive losses consistent with dementia. Based on the substantial degree of impairment it is clear that he does not have the capacity to make informed and appropriate decisions on his healthcare and finances. I do recommend that he lives in a structured setting and has 24-hour care. It is also clear that patient does not comprehend the degree of cognitive losses or the risks that he has been in with falls and self-neglect within the home. On this basis I recommend that a formal guardian of patient's person and financial affairs be put in place,  someone who can continue to look out for the best interests of patient who is suffering from substantial cognitive impairment due to dementia. I had a long discussion with wife. We discussed placement into memory units."   Alternatively we can put this into a separate letter for her. Ask her and she really wants a letter please state he is a patient of mine and copy the above thanks.

## 2016-06-21 NOTE — Telephone Encounter (Signed)
Pt's wife called request letter that states the pt can not make decisions on his own. She is working on [employer].

## 2016-06-21 NOTE — Telephone Encounter (Signed)
Dr Lucia GaskinsAhern- you last saw patient 04/16/16. Are you ok with writing this for them?

## 2016-06-21 NOTE — Telephone Encounter (Signed)
LVM for pt wife. Advised she can request AA, MD last OV note which is very detailed. Will have to sign record release form in office to obtain copy. Advised we are open Monday-Thursday 8-5 and Friday's 8-12pm.

## 2016-06-25 NOTE — Telephone Encounter (Signed)
Called and LVM for wife. Wanted to makes sure she got my message from last week. Gave [facility] phone number. Asked her to call back.

## 2016-06-27 NOTE — Telephone Encounter (Signed)
Called wife back. Advised she cannot write letter. She can pick up office visit note as previously mentioned which has everything they requested. She has to have him sign a record release form to obtain copy. She picked up copy today and stated he had shoulder surgery. Advised to have him sign as best as he can. Use other hand if needed. He has to sign since he is the patient. She will bring back next week and get copy of office note. I advised this is our office policy to obtain records. She verbalized understanding.

## 2016-06-27 NOTE — Telephone Encounter (Signed)
Pt's wife called to advise she would rather have a letter than just the last OV. Please mail to her home address ( I checked the current address, it is correct).

## 2016-07-09 ENCOUNTER — Ambulatory Visit (INDEPENDENT_AMBULATORY_CARE_PROVIDER_SITE_OTHER): Payer: Medicare Other | Admitting: Orthopaedic Surgery

## 2016-07-09 ENCOUNTER — Encounter (INDEPENDENT_AMBULATORY_CARE_PROVIDER_SITE_OTHER): Payer: Self-pay | Admitting: Orthopaedic Surgery

## 2016-07-09 DIAGNOSIS — S4291XD Fracture of right shoulder girdle, part unspecified, subsequent encounter for fracture with routine healing: Secondary | ICD-10-CM | POA: Diagnosis not present

## 2016-07-09 NOTE — Progress Notes (Signed)
   Office Visit Note   Patient: Jacob Rich           Date of Birth: 05/23/1931           MRN: [id number] Visit Date: 07/09/2016              Requested by: No referring provider defined for this encounter. PCP: No PCP Per Patient   Assessment & Plan: Visit Diagnoses: No diagnosis found.  Plan: I discussed with him today that Mr. Biller's shoulder likely has a rotator cuff tear given his age and the shoulder dislocation however he is not a surgical candidate given his severe dementia and low likelihood that the surgery would be successful. I recommend continue physical therapy to help compensate for his rotator cuff tears. I will see him back as needed. Questions encouraged and answered today.  Follow-Up Instructions: Return if symptoms worsen or fail to improve.   Orders:  No orders of the defined types were placed in this encounter.  No orders of the defined types were placed in this encounter.     Procedures: No procedures performed   Clinical Data: No additional findings.   Subjective: Chief Complaint  Patient presents with  . Right Shoulder - Pain    Patient follows up today for his shoulder dislocation and pain. He continues to have pain. Patient does have severe dementia. He is here today with his wife who is providing the details. He has trouble elevating his arm above 35.    Review of Systems   Objective: Vital Signs: There were no vitals taken for this visit.  Physical Exam  Ortho Exam exam of the right shoulder shows a rotator cuff deficient shoulder. However his strength and active range of motion have improved since the previous visit.   Specialty Comments:  No specialty comments available.  Imaging: No results found.   PMFS History: Patient Active Problem List   Diagnosis Date Noted  . Traumatic closed displaced fracture of shoulder with anterior dislocation with routine healing 06/10/2016  . Dementia with behavioral disturbance 04/16/2016     Past Medical History:  Diagnosis Date  . Dementia   . MRSA infection     Family History  Problem Relation Age of Onset  . Glaucoma Mother     Past Surgical History:  Procedure Laterality Date  . MRSa     2006/2007  . OTHER SURGICAL HISTORY     Dicverticulitis   Social History   Occupational History  . Retired    Social History Main Topics  . Smoking status: Never Smoker  . Smokeless tobacco: Never Used  . Alcohol use No  . Drug use: No  . Sexual activity: Not on file

## 2016-07-10 ENCOUNTER — Telehealth (INDEPENDENT_AMBULATORY_CARE_PROVIDER_SITE_OTHER): Payer: Self-pay

## 2016-07-10 NOTE — Telephone Encounter (Signed)
[facility] He needed  Dictation stating for patient to continue physical therapy. Faxed to [facility] to [phone]

## 2016-07-22 ENCOUNTER — Ambulatory Visit (INDEPENDENT_AMBULATORY_CARE_PROVIDER_SITE_OTHER): Payer: Medicare Other | Admitting: Orthopaedic Surgery

## 2016-09-17 ENCOUNTER — Ambulatory Visit: Payer: Medicare Other | Admitting: Adult Health

## 2017-06-17 ENCOUNTER — Encounter (HOSPITAL_COMMUNITY): Payer: Self-pay

## 2017-06-17 ENCOUNTER — Observation Stay (HOSPITAL_COMMUNITY)
Admission: EM | Admit: 2017-06-17 | Discharge: 2017-06-17 | Disposition: A | Discharge status: Home / Self Care | Payer: Medicare (Managed Care) | Attending: Internal Medicine | Admitting: Internal Medicine

## 2017-06-17 DIAGNOSIS — F0391 Unspecified dementia with behavioral disturbance: Secondary | ICD-10-CM | POA: Insufficient documentation

## 2017-06-17 DIAGNOSIS — F03918 Unspecified dementia, unspecified severity, with other behavioral disturbance: Secondary | ICD-10-CM | POA: Diagnosis present

## 2017-06-17 DIAGNOSIS — Z79899 Other long term (current) drug therapy: Secondary | ICD-10-CM | POA: Insufficient documentation

## 2017-06-17 DIAGNOSIS — F0281 Dementia in other diseases classified elsewhere with behavioral disturbance: Secondary | ICD-10-CM | POA: Diagnosis not present

## 2017-06-17 DIAGNOSIS — N390 Urinary tract infection, site not specified: Principal | ICD-10-CM | POA: Insufficient documentation

## 2017-06-17 DIAGNOSIS — G309 Alzheimer's disease, unspecified: Secondary | ICD-10-CM | POA: Diagnosis not present

## 2017-06-17 DIAGNOSIS — Z8614 Personal history of Methicillin resistant Staphylococcus aureus infection: Secondary | ICD-10-CM | POA: Insufficient documentation

## 2017-06-17 LAB — COMPREHENSIVE METABOLIC PANEL
ALBUMIN: 3.9 g/dL (ref 3.5–5.0)
ALK PHOS: 85 U/L (ref 38–126)
ALT: 17 U/L (ref 17–63)
ANION GAP: 8 (ref 5–15)
AST: 25 U/L (ref 15–41)
BUN: 18 mg/dL (ref 6–20)
CHLORIDE: 107 mmol/L (ref 101–111)
CO2: 23 mmol/L (ref 22–32)
Calcium: 9 mg/dL (ref 8.9–10.3)
Creatinine, Ser: 1.31 mg/dL — ABNORMAL HIGH (ref 0.61–1.24)
GFR calc Af Amer: 56 mL/min — ABNORMAL LOW (ref >60)
GFR calc non Af Amer: 48 mL/min — ABNORMAL LOW (ref >60)
Glucose, Bld: 112 mg/dL — ABNORMAL HIGH (ref 65–99)
POTASSIUM: 3.7 mmol/L (ref 3.5–5.1)
SODIUM: 138 mmol/L (ref 135–145)
Total Bilirubin: 1.6 mg/dL — ABNORMAL HIGH (ref 0.3–1.2)
Total Protein: 6.7 g/dL (ref 6.5–8.1)

## 2017-06-17 LAB — URINALYSIS, ROUTINE W REFLEX MICROSCOPIC
BILIRUBIN URINE: NEGATIVE
Glucose, UA: NEGATIVE mg/dL
KETONES UR: NEGATIVE mg/dL
Nitrite: NEGATIVE
PROTEIN: NEGATIVE mg/dL
SPECIFIC GRAVITY, URINE: 1.008 (ref 1.005–1.030)
SQUAMOUS EPITHELIAL / LPF: NONE SEEN
pH: 7 (ref 5.0–8.0)

## 2017-06-17 LAB — CBC WITH DIFFERENTIAL/PLATELET
Basophils Absolute: 0 10*3/uL (ref 0.0–0.1)
Basophils Relative: 0 %
EOS ABS: 0.1 10*3/uL (ref 0.0–0.7)
Eosinophils Relative: 1 %
HEMATOCRIT: 38 % — AB (ref 39.0–52.0)
HEMOGLOBIN: 13.4 g/dL (ref 13.0–17.0)
LYMPHS ABS: 1.1 10*3/uL (ref 0.7–4.0)
LYMPHS PCT: 9 %
MCH: 32.9 pg (ref 26.0–34.0)
MCHC: 35.3 g/dL (ref 30.0–36.0)
MCV: 93.4 fL (ref 78.0–100.0)
MONOS PCT: 6 %
Monocytes Absolute: 0.8 10*3/uL (ref 0.1–1.0)
NEUTROS ABS: 10.6 10*3/uL — AB (ref 1.7–7.7)
NEUTROS PCT: 84 %
PLATELETS: 202 10*3/uL (ref 150–400)
RBC: 4.07 MIL/uL — ABNORMAL LOW (ref 4.22–5.81)
RDW: 12.6 % (ref 11.5–15.5)
WBC: 12.6 10*3/uL — AB (ref 4.0–10.5)

## 2017-06-17 LAB — PROTIME-INR
INR: 1
Prothrombin Time: 13.1 seconds (ref 11.4–15.2)

## 2017-06-17 LAB — I-STAT CG4 LACTIC ACID, ED: Lactic Acid, Venous: 1.15 mmol/L (ref 0.5–1.9)

## 2017-06-17 MED ORDER — ONDANSETRON 4 MG PO TBDP: 4.0000 mg | ORAL_TABLET | Freq: Three times a day (TID) | ORAL | 0 refills | Status: DC | PRN

## 2017-06-17 MED ORDER — SODIUM CHLORIDE 0.9 % IV BOLUS (SEPSIS)
1000.0000 mL | Freq: Once | INTRAVENOUS | Status: AC
  Administered 2017-06-17: 1000 mL via INTRAVENOUS

## 2017-06-17 MED ORDER — CEPHALEXIN 500 MG PO CAPS: 500.0000 mg | ORAL_CAPSULE | Freq: Four times a day (QID) | ORAL | 0 refills | Status: AC

## 2017-06-17 MED ORDER — ACETAMINOPHEN 325 MG PO TABS
650.0000 mg | ORAL_TABLET | Freq: Once | ORAL | Status: AC
  Administered 2017-06-17: 650 mg via ORAL
  Filled 2017-06-17: qty 2

## 2017-06-17 MED ORDER — CEFTRIAXONE SODIUM 1 G IJ SOLR
1.0000 g | Freq: Once | INTRAMUSCULAR | Status: AC
  Administered 2017-06-17: 1 g via INTRAVENOUS
  Filled 2017-06-17: qty 10

## 2017-06-17 NOTE — Consult Note (Signed)
Consult Note   Jacob Rich ZOX:[id number] DOB: September 16, 1930 DOA: 06/17/2017  PCP: Parke Simmers Consultants:  [facility] Patient coming from:  Home - lives with wife and daughter; Jackey Loge: Wife, [phone]  Chief Complaint: urinary frequency  HPI: Jacob Rich is a 83 y.o. male with medical history significant of dementia presenting with urinary incontinence and frequency.  His wife reports that he gets an annual UTI and has not had one in 13 months.  This AM, he went outside and was unable to walk back inside.  His wife is concerned that it was getting worse as the day went on.  He could barely walk in the house.  She told him to go to bed and then she called the ambulance.  Not complaining of symptoms.  No more confusion than usual.  +urinary incontinence.   ED Course: Confusion, urinary incontinence.  Leukocytosis with fever to 103 by EMS (none here).    Review of Systems:  Unable to assess  Ambulatory Status:  Ambulates without assistance  Past Medical History:  Diagnosis Date  . Dementia   . MRSA infection     Past Surgical History:  Procedure Laterality Date  . MRSa     2006/2007  . OTHER SURGICAL HISTORY     Dicverticulitis    Social History   Social History  . Marital status: Married    Spouse name: [name]  . Number of children: 4  . Years of education: 12   Occupational History  . Retired    Social History Main Topics  . Smoking status: Never Smoker  . Smokeless tobacco: Never Used  . Alcohol use Yes     Comment: rare  . Drug use: No  . Sexual activity: Not on file   Other Topics Concern  . Not on file   Social History Narrative   Lives with wife and daughter   Caffeine use: Drinks 2-3 glasses tea per day    No Known Allergies  Family History  Problem Relation Age of Onset  . Glaucoma Mother     Prior to Admission medications   Medication Sig Start Date End Date Taking? Authorizing Provider  donepezil (ARICEPT) 10 MG tablet Take 1 tablet  (10 mg total) by mouth at bedtime. 04/16/16   Anson Fret, MD  HYDROcodone-acetaminophen (NORCO/VICODIN) 5-325 MG tablet Take 1 tablet by mouth every 4 (four) hours as needed. Patient not taking: Reported on 07/09/2016 04/26/16   Charlynne Pander, MD  levocetirizine (XYZAL) 5 MG tablet Take 5 mg by mouth daily. 04/08/16   [provider]  memantine (NAMENDA) 5 MG tablet Take 5 mg by mouth daily. 05/21/17   [provider]  triamcinolone cream (KENALOG) 0.1 % Apply 1 application topically daily. 03/23/16   [provider]    Physical Exam: Vitals:   06/17/17 1812 06/17/17 1905 06/17/17 2010 06/17/17 2223  BP: 134/63 136/84 121/67 (!) 150/63  Pulse: 66 71 77 80  Resp: 18 18 18 16   Temp: 100 F (37.8 C) 97.8 F (36.6 C) 98.5 F (36.9 C) 97.9 F (36.6 C)  TempSrc: Rectal Oral Oral Oral  SpO2: 95% 100% 100% 100%     General: Appears calm and comfortable and is NAD; he is clearly demented and is agitated by pulse ox Eyes:  PERRL, EOMI, normal lids, iris ENT:  grossly normal hearing, lips & tongue, mmm Neck:  no LAD, masses or thyromegaly Cardiovascular:  RRR, no m/r/g. No LE edema.  Respiratory:   CTA bilaterally  with no wheezes/rales/rhonchi.  Normal respiratory effort. Abdomen:  soft, NT, ND, NABS Skin:  no rash or induration seen on limited exam Musculoskeletal:  grossly normal tone BUE/BLE, good ROM, no bony abnormality Psychiatric:  grossly normal mood and affect, speech fluent and appropriate but with dementia, AOx2 Neurologic:  CN 2-12 grossly intact, moves all extremities in coordinated fashion, sensation intact    Radiological Exams on Admission: No results found.  EKG: not done   Labs on Admission: I have personally reviewed the available labs and imaging studies at the time of the admission.  Pertinent labs:   Glucose 112 BUN 18/Creatinine 1.31/GFR 48 - improved Lactate 1.15 WBC 12.6 UA: small Hgb, large LE, few bacteria, TNTC  WBC   Assessment/Plan Principal Problem:   UTI (urinary tract infection) Active Problems:   Dementia with behavioral disturbance   UTI -Patient presenting with possible AMS, although difficult to tell with his dementia -Urinary symptoms and positive UA, likely UTI -He does not currently have 2+ SIRS criteria and his lactate is normal, so he does not appear to have sepsis at this time -He is able to take PO antibiotics and it seems reasonable to at least try with outpatient management first  Dementia -Patient is on Aricept and Namenda -Patients with dementia do not tend to do as well while hospitalized due to agitation and confusion -If the patient does not require hospitalization, it would be more prudent to at least try outpatient therapy first -Additionally, the patient's wife reports that they cannot afford out of pocket costs for SNF care and so placement is not a consideration at this time  Based on overall mild severity of illness and other concerns as listed above, patient is considered more appropriate for outpatient treatment at this time.  He wife was in agreement with this plan.    Jonah BlueJennifer Analiza Cowger MD [facility]  If note is complete, please contact covering daytime or nighttime physician. [email or web address] Password [email or web address]  06/18/2017, 12:24 AM

## 2017-06-17 NOTE — ED Provider Notes (Signed)
[facility]-EMERGENCY DEPT Provider Note   CSN: [id number] Arrival date & time: 06/17/17  1737     History   Chief Complaint Chief Complaint  Patient presents with  . Urinary Frequency    HPI Jacob Rich is a 83 y.o. male.  HPI   83 year old male sent here by his wife for evaluation of fever and altered mental status.  Patient has history of severe dementia and is unable to provide history.  He denies complaints at this time.  According to report from EMS, the patient has been increasingly confused throughout the day.  He was found with a small amount of urine next to him by his wife, confused.  He is not normally incontinent.  Patient was found to have a fever of 103 and was given 1 g of Tylenol by EMS.  On my assessment, the patient is without complaints.  Level 5 caveat invoked as remainder of history, ROS, and physical exam limited due to patient's dementia.   Past Medical History:  Diagnosis Date  . Dementia   . MRSA infection     Patient Active Problem List   Diagnosis Date Noted  . UTI (urinary tract infection) 06/17/2017  . Traumatic closed displaced fracture of shoulder with anterior dislocation with routine healing 06/10/2016  . Dementia with behavioral disturbance 04/16/2016    Past Surgical History:  Procedure Laterality Date  . MRSa     2006/2007  . OTHER SURGICAL HISTORY     Dicverticulitis       Home Medications    Prior to Admission medications   Medication Sig Start Date End Date Taking? Authorizing Provider  cephALEXin (KEFLEX) 500 MG capsule Take 1 capsule (500 mg total) by mouth 4 (four) times daily. 06/17/17 06/27/17  Shaune Pollack, MD  donepezil (ARICEPT) 10 MG tablet Take 1 tablet (10 mg total) by mouth at bedtime. 04/16/16   Anson Fret, MD  HYDROcodone-acetaminophen (NORCO/VICODIN) 5-325 MG tablet Take 1 tablet by mouth every 4 (four) hours as needed. Patient not taking: Reported on 07/09/2016 04/26/16   Charlynne Pander, MD  levocetirizine (XYZAL) 5 MG tablet Take 5 mg by mouth daily. 04/08/16   [provider]  memantine (NAMENDA) 5 MG tablet Take 5 mg by mouth daily. 05/21/17   [provider]  ondansetron (ZOFRAN ODT) 4 MG disintegrating tablet Take 1 tablet (4 mg total) by mouth every 8 (eight) hours as needed for nausea or vomiting. 06/17/17   Shaune Pollack, MD  triamcinolone cream (KENALOG) 0.1 % Apply 1 application topically daily. 03/23/16   [provider]    Family History Family History  Problem Relation Age of Onset  . Glaucoma Mother     Social History Social History  Substance Use Topics  . Smoking status: Never Smoker  . Smokeless tobacco: Never Used  . Alcohol use Yes     Comment: rare     Allergies   Patient has no known allergies.   Review of Systems Review of Systems  Unable to perform ROS: Dementia     Physical Exam Updated Vital Signs BP (!) 150/63 (BP Location: Left Arm)   Pulse 80   Temp 97.9 F (36.6 C) (Oral)   Resp 16   SpO2 100%   Physical Exam  Constitutional: He appears well-developed and well-nourished. No distress.  HENT:  Head: Normocephalic and atraumatic.  Eyes: Conjunctivae are normal.  Neck: Neck supple.  Cardiovascular: Normal rate, regular rhythm and normal heart sounds.  Exam reveals no friction rub.   No murmur heard. Pulmonary/Chest: Effort normal and breath sounds normal. No respiratory distress. He has no wheezes. He has no rales.  Abdominal: Soft. Bowel sounds are normal. There is no tenderness. There is no guarding.  No abd TTP. No CVAT bilaterally  Musculoskeletal: He exhibits no edema.  Neurological: He is alert. He exhibits normal muscle tone.  Oriented to person only.  Moves all extremities with 5 out of 5 strength.  Endorses normal sensation to light touch in bilateral upper and lower extremities.  Skin: Skin is warm. Capillary refill takes less than 2 seconds.  Psychiatric: He has a  normal mood and affect.  Nursing note and vitals reviewed.    ED Treatments / Results  Labs (all labs ordered are listed, but only abnormal results are displayed) Labs Reviewed  URINALYSIS, ROUTINE W REFLEX MICROSCOPIC - Abnormal; Notable for the following:       Result Value   Hgb urine dipstick SMALL (*)    Leukocytes, UA LARGE (*)    Bacteria, UA FEW (*)    All other components within normal limits  COMPREHENSIVE METABOLIC PANEL - Abnormal; Notable for the following:    Glucose, Bld 112 (*)    Creatinine, Ser 1.31 (*)    Total Bilirubin 1.6 (*)    GFR calc non Af Amer 48 (*)    GFR calc Af Amer 56 (*)    All other components within normal limits  CBC WITH DIFFERENTIAL/PLATELET - Abnormal; Notable for the following:    WBC 12.6 (*)    RBC 4.07 (*)    HCT 38.0 (*)    Neutro Abs 10.6 (*)    All other components within normal limits  CULTURE, BLOOD (ROUTINE X 2)  CULTURE, BLOOD (ROUTINE X 2)  URINE CULTURE  PROTIME-INR  I-STAT CG4 LACTIC ACID, ED    EKG  EKG Interpretation None       Radiology No results found.  Procedures Procedures (including critical care time)  Medications Ordered in ED Medications  sodium chloride 0.9 % bolus 1,000 mL (0 mLs Intravenous Stopped 06/17/17 2008)  cefTRIAXone (ROCEPHIN) 1 g in dextrose 5 % 50 mL IVPB (0 g Intravenous Stopped 06/17/17 1955)  acetaminophen (TYLENOL) tablet 650 mg (650 mg Oral Given 06/17/17 2123)     Initial Impression / Assessment and Plan / ED Course  I have reviewed the triage vital signs and the nursing notes.  Pertinent labs & imaging results that were available during my care of the patient were reviewed by me and considered in my medical decision making (see chart for details).     83 yo M here with confusion, urinary incontinence in setting of likely UTI. Lives with wife at home. Labs show leukocytosis, pt with fever to 103 by EMS (100F after 1g tylenol here). Concern for febrile UTI. Pt given  fluids, Rocephin, and will consult Hospitalist for admission.  Discussed with Dr. Ophelia CharterYates, who has evaluated pt. Based on shared decision making with pt and pt's wife, myself, and Dr. Ophelia CharterYates, pt will be managed as outpt. Keflex prescribed and pt given Rocephin. Given his markedly well appearance, stable vitals, and o/w reassuring period of obs in ED, feel this is reasonable.  Final Clinical Impressions(s) / ED Diagnoses   Final diagnoses:  Lower urinary tract infectious disease    New Prescriptions Discharge Medication List as of 06/17/2017 10:23 PM    START taking these medications   Details  cephALEXin (KEFLEX) 500  MG capsule Take 1 capsule (500 mg total) by mouth 4 (four) times daily., Starting Tue 06/17/2017, Until Fri 06/27/2017, Print    ondansetron (ZOFRAN ODT) 4 MG disintegrating tablet Take 1 tablet (4 mg total) by mouth every 8 (eight) hours as needed for nausea or vomiting., Starting Tue 06/17/2017, Print         Shaune Pollack, MD 06/18/17 [phone]

## 2017-06-17 NOTE — ED Notes (Signed)
Pt refused in and out cath. He states "I'm not sick, I can pee."

## 2017-06-17 NOTE — ED Triage Notes (Addendum)
Pt BIB [facility] from home. Per EMS, his wife reports that this morning he started acting differently and she noticed a puddle of urine by his bed. He has been experiencing increased incontinence all day. She states that he usually gets a UTI about once a year and this is his typical presentation. Pt had a fever of 103 with EMS and was given 1000mg  of tylenol en route. Pt has dementia at baseline and is currently denying any symptoms or pain.

## 2017-06-17 NOTE — ED Notes (Signed)
Wife Jacob Rich [phone]

## 2017-06-17 NOTE — Discharge Instructions (Signed)
Based on discussion with Dr. Ophelia CharterYates, it is reasonable to treat your infection at home.  MAKE SURE YOU TAKE THE ANTIBIOTICS AS PRESCRIBED, FOR THE FULL COURSE  Drink plenty of fluids  If you develop recurrent fever, worsening confusion, nausea/vomiting, return to the ER immediately

## 2017-06-20 LAB — URINE CULTURE

## 2017-06-21 ENCOUNTER — Telehealth: Payer: Self-pay | Admitting: Emergency Medicine

## 2017-06-21 ENCOUNTER — Telehealth (HOSPITAL_BASED_OUTPATIENT_CLINIC_OR_DEPARTMENT_OTHER): Payer: Self-pay | Admitting: Emergency Medicine

## 2017-06-21 ENCOUNTER — Telehealth (HOSPITAL_BASED_OUTPATIENT_CLINIC_OR_DEPARTMENT_OTHER): Payer: Self-pay

## 2017-06-21 LAB — BLOOD CULTURE ID PANEL (REFLEXED)
ACINETOBACTER BAUMANNII: NOT DETECTED
CANDIDA ALBICANS: NOT DETECTED
CANDIDA KRUSEI: NOT DETECTED
CANDIDA PARAPSILOSIS: NOT DETECTED
CANDIDA TROPICALIS: NOT DETECTED
Candida glabrata: NOT DETECTED
ENTEROBACTER CLOACAE COMPLEX: NOT DETECTED
ENTEROCOCCUS SPECIES: NOT DETECTED
Enterobacteriaceae species: NOT DETECTED
Escherichia coli: NOT DETECTED
HAEMOPHILUS INFLUENZAE: NOT DETECTED
KLEBSIELLA OXYTOCA: NOT DETECTED
KLEBSIELLA PNEUMONIAE: NOT DETECTED
LISTERIA MONOCYTOGENES: NOT DETECTED
Neisseria meningitidis: NOT DETECTED
Proteus species: NOT DETECTED
Pseudomonas aeruginosa: NOT DETECTED
STREPTOCOCCUS PYOGENES: NOT DETECTED
STREPTOCOCCUS SPECIES: NOT DETECTED
Serratia marcescens: NOT DETECTED
Staphylococcus aureus (BCID): NOT DETECTED
Staphylococcus species: NOT DETECTED
Streptococcus agalactiae: NOT DETECTED
Streptococcus pneumoniae: NOT DETECTED

## 2017-06-21 NOTE — Telephone Encounter (Signed)
Post ED Visit - Positive Culture Follow-up: Successful Patient Follow-Up  Culture assessed and recommendations reviewed by: []  Enzo BiNathan Batchelder, Pharm.D. []  Celedonio MiyamotoJeremy Frens, Pharm.D., BCPS AQ-ID [x]  Garvin FilaMike Maccia, Pharm.D., BCPS []  Georgina PillionElizabeth Martin, Pharm.D., BCPS []  JenningsMinh Pham, VermontPharm.D., BCPS, AAHIVP []  Estella HuskMichelle Turner, Pharm.D., BCPS, AAHIVP []  Lysle Pearlachel Rumbarger, PharmD, BCPS []  Casilda Carlsaylor Stone, PharmD, BCPS []  Pollyann SamplesAndy Johnston, PharmD, BCPS  Positive urine culture  []  Patient discharged without antimicrobial prescription and treatment is now indicated [x]  Organism is resistant to prescribed ED discharge antimicrobial []  Patient with positive blood cultures  Changes discussed with ED provider: Claudia DesanctisEmily Shrosbee PA New antibiotic prescription stop keflex, start fosfomycin 3 grams po q 48 hours x 3 doses  Attempting to contact patient    Berle MullMiller, Lyrika Souders 06/21/2017, 12:57 PM

## 2017-06-21 NOTE — Progress Notes (Signed)
ED Antimicrobial Stewardship Positive Culture Follow Up   Jacob Rich is an 83 y.o. male who presented to [facility] on 06/17/2017 with a chief complaint of  Chief Complaint  Patient presents with  . Urinary Frequency    Recent Results (from the past 720 hour(s))  Urine culture     Status: Abnormal   Collection Time: 06/17/17  6:41 PM  Result Value Ref Range Status   Specimen Description URINE, RANDOM  Final   Special Requests NONE  Final   Culture (A)  Final    30,000 COLONIES/mL KLEBSIELLA PNEUMONIAE Confirmed Extended Spectrum Beta-Lactamase Producer (ESBL).  In bloodstream infections from ESBL organisms, carbapenems are preferred over piperacillin/tazobactam. They are shown to have a lower risk of mortality. Performed at [facility], [address] [place], [place] [address]    Report Status 06/20/2017 FINAL  Final   Organism ID, Bacteria KLEBSIELLA PNEUMONIAE (A)  Final      Susceptibility   Klebsiella pneumoniae - MIC*    AMPICILLIN >=32 RESISTANT Resistant     CEFAZOLIN >=64 RESISTANT Resistant     CEFTRIAXONE >=64 RESISTANT Resistant     CIPROFLOXACIN >=4 RESISTANT Resistant     GENTAMICIN 4 SENSITIVE Sensitive     IMIPENEM 0.5 SENSITIVE Sensitive     NITROFURANTOIN >=512 RESISTANT Resistant     TRIMETH/SULFA >=320 RESISTANT Resistant     AMPICILLIN/SULBACTAM >=32 RESISTANT Resistant     PIP/TAZO >=128 RESISTANT Resistant     Extended ESBL POSITIVE Resistant     * 30,000 COLONIES/mL KLEBSIELLA PNEUMONIAE  Culture, blood (Routine x 2)     Status: None (Preliminary result)   Collection Time: 06/17/17  6:44 PM  Result Value Ref Range Status   Specimen Description BLOOD RIGHT ANTECUBITAL  Final   Special Requests   Final    BOTTLES DRAWN AEROBIC AND ANAEROBIC Blood Culture adequate volume   Culture  Setup Time   Final    GRAM POSITIVE RODS AEROBIC BOTTLE ONLY CRITICAL RESULT CALLED TO, READ BACK BY AND VERIFIED WITH: Lemar Livings AT 6045 06/21/17 BY L  BENFIELD Performed at [facility], [address], [place], [place] [address]    Culture GRAM POSITIVE RODS  Final   Report Status PENDING  Incomplete  Blood Culture ID Panel (Reflexed)     Status: None   Collection Time: 06/17/17  6:44 PM  Result Value Ref Range Status   Enterococcus species NOT DETECTED NOT DETECTED Final   Listeria monocytogenes NOT DETECTED NOT DETECTED Final   Staphylococcus species NOT DETECTED NOT DETECTED Final   Staphylococcus aureus NOT DETECTED NOT DETECTED Final   Streptococcus species NOT DETECTED NOT DETECTED Final   Streptococcus agalactiae NOT DETECTED NOT DETECTED Final   Streptococcus pneumoniae NOT DETECTED NOT DETECTED Final   Streptococcus pyogenes NOT DETECTED NOT DETECTED Final   Acinetobacter baumannii NOT DETECTED NOT DETECTED Final   Enterobacteriaceae species NOT DETECTED NOT DETECTED Final   Enterobacter cloacae complex NOT DETECTED NOT DETECTED Final   Escherichia coli NOT DETECTED NOT DETECTED Final   Klebsiella oxytoca NOT DETECTED NOT DETECTED Final   Klebsiella pneumoniae NOT DETECTED NOT DETECTED Final   Proteus species NOT DETECTED NOT DETECTED Final   Serratia marcescens NOT DETECTED NOT DETECTED Final   Haemophilus influenzae NOT DETECTED NOT DETECTED Final   Neisseria meningitidis NOT DETECTED NOT DETECTED Final   Pseudomonas aeruginosa NOT DETECTED NOT DETECTED Final   Candida albicans NOT DETECTED NOT DETECTED Final   Candida  glabrata NOT DETECTED NOT DETECTED Final   Candida krusei NOT DETECTED NOT DETECTED Final   Candida parapsilosis NOT DETECTED NOT DETECTED Final   Candida tropicalis NOT DETECTED NOT DETECTED Final    Comment: Performed at [facility], [address] [place], [place] [address]  Culture, blood (Routine x 2)     Status: None (Preliminary result)   Collection Time: 06/17/17  7:00 PM  Result Value Ref Range Status   Specimen Description BLOOD LEFT HAND  Final   Special Requests IN PEDIATRIC  BOTTLE Blood Culture adequate volume  Final   Culture   Final    NO GROWTH 3 DAYS Performed at [facility], [address] [place], [place] [address]    Report Status PENDING  Incomplete    [x]  Treated with cephalexin, organism resistant to prescribed antimicrobial []  Patient discharged originally without antimicrobial agent and treatment is now indicated  New antibiotic prescription: fosfomycin 3 g q48 hrs x 3 doses  ED Provider: Shirlyn GoltzEmily Shrosbree, PA Thea Gist  Manveer Gomes A Reis Goga 06/21/2017, 9:47 AM Infectious Diseases Pharmacist Phone# [phone]

## 2017-06-21 NOTE — Telephone Encounter (Signed)
Spoke w/pts wife regarding need for addl tx due to resistance of previously prescribed abx, Keflex.  New Rx for Fosfomycin 3 grams po every 48 hrs x 3 doses called and in and left on VM @ [facility] on [address] [phone]

## 2017-06-22 LAB — CULTURE, BLOOD (ROUTINE X 2)
CULTURE: NO GROWTH
SPECIAL REQUESTS: ADEQUATE

## 2017-06-23 ENCOUNTER — Telehealth: Payer: Self-pay | Admitting: Emergency Medicine

## 2017-07-14 ENCOUNTER — Telehealth: Payer: Self-pay | Admitting: Emergency Medicine

## 2017-07-14 NOTE — Telephone Encounter (Signed)
Lost to followup 

## 2017-07-21 LAB — MISC LABCORP TEST (SEND OUT): Labcorp test code: 183402

## 2017-07-22 LAB — CULTURE, BLOOD (ROUTINE X 2): Special Requests: ADEQUATE

## 2017-08-13 LAB — MISC LABCORP TEST (SEND OUT): LABCORP TEST CODE: 183402

## 2017-11-03 ENCOUNTER — Encounter (HOSPITAL_COMMUNITY): Payer: Self-pay

## 2017-11-03 ENCOUNTER — Other Ambulatory Visit: Payer: Self-pay

## 2017-11-03 ENCOUNTER — Emergency Department (HOSPITAL_COMMUNITY): Payer: Medicare (Managed Care)

## 2017-11-03 ENCOUNTER — Emergency Department (HOSPITAL_COMMUNITY)
Admission: EM | Admit: 2017-11-03 | Discharge: 2017-11-04 | Disposition: A | Discharge status: Home / Self Care | Payer: Medicare (Managed Care) | Attending: Physician Assistant | Admitting: Physician Assistant

## 2017-11-03 DIAGNOSIS — F0390 Unspecified dementia without behavioral disturbance: Secondary | ICD-10-CM | POA: Diagnosis not present

## 2017-11-03 DIAGNOSIS — M545 Low back pain, unspecified: Secondary | ICD-10-CM

## 2017-11-03 DIAGNOSIS — Z79899 Other long term (current) drug therapy: Secondary | ICD-10-CM | POA: Insufficient documentation

## 2017-11-03 MED ORDER — DICLOFENAC SODIUM 50 MG PO TBEC: 50.0000 mg | DELAYED_RELEASE_TABLET | Freq: Two times a day (BID) | ORAL | 0 refills | Status: DC

## 2017-11-03 MED ORDER — HYDROCODONE-ACETAMINOPHEN 5-325 MG PO TABS: 1.0000 | ORAL_TABLET | Freq: Four times a day (QID) | ORAL | 0 refills | Status: DC | PRN

## 2017-11-03 MED ORDER — OXYCODONE-ACETAMINOPHEN 5-325 MG PO TABS
1.0000 | ORAL_TABLET | Freq: Once | ORAL | Status: AC
  Administered 2017-11-03: 1 via ORAL
  Filled 2017-11-03: qty 1

## 2017-11-03 MED ORDER — NAPROXEN 500 MG PO TABS
500.0000 mg | ORAL_TABLET | Freq: Once | ORAL | Status: AC
  Administered 2017-11-03: 500 mg via ORAL
  Filled 2017-11-03: qty 1

## 2017-11-03 MED ORDER — LIDOCAINE 5 % EX PTCH: 1.0000 | MEDICATED_PATCH | CUTANEOUS | 0 refills | Status: DC

## 2017-11-03 MED ORDER — LIDOCAINE 5 % EX PTCH
1.0000 | MEDICATED_PATCH | CUTANEOUS | Status: DC
  Administered 2017-11-03: 1 via TRANSDERMAL
  Filled 2017-11-03: qty 1

## 2017-11-03 NOTE — Discharge Instructions (Signed)
Take diclofenac as prescribed and use a Lidoderm patch for pain management.  You may supplement this with Norco if the pain is severe.  Be cautious as Norco may make you drowsy and impair your judgment.  We recommend follow-up with your primary care doctor in 1 week to discuss your symptoms.  Return to the emergency department if symptoms persist or worsen.

## 2017-11-03 NOTE — ED Provider Notes (Signed)
[facility]-EMERGENCY DEPT Provider Note   CSN: [id number] Arrival date & time: 11/03/17  1840     History   Chief Complaint Chief Complaint  Patient presents with  . Back Pain    HPI Jacob Rich is a 84 y.o. male.   Level 5 caveat secondary to dementia  84 year old male presents to the emergency department for evaluation of 1 week of low back pain.  Pain has progressively worsened and wife notes that it was difficult for the patient to get off the couch earlier today.  Wife is unclear of whether the patient had any injury or falls precipitating his symptoms.  He is often at home by himself when she is at work.  He denies any extremity numbness or paresthesias as well as bowel or bladder incontinence.  No recent fevers.  He is ambulatory without assistance at baseline.      Past Medical History:  Diagnosis Date  . Dementia   . MRSA infection     Patient Active Problem List   Diagnosis Date Noted  . UTI (urinary tract infection) 06/17/2017  . Traumatic closed displaced fracture of shoulder with anterior dislocation with routine healing 06/10/2016  . Dementia with behavioral disturbance 04/16/2016    Past Surgical History:  Procedure Laterality Date  . MRSa     2006/2007  . OTHER SURGICAL HISTORY     Dicverticulitis       Home Medications    Prior to Admission medications   Medication Sig Start Date End Date Taking? Authorizing Provider  donepezil (ARICEPT) 10 MG tablet Take 1 tablet (10 mg total) by mouth at bedtime. Patient taking differently: Take 10 mg by mouth daily.  04/16/16  Yes Anson Fret, MD  diclofenac (VOLTAREN) 50 MG EC tablet Take 1 tablet (50 mg total) by mouth 2 (two) times daily. 11/03/17   Antony Madura, PA-C  HYDROcodone-acetaminophen (NORCO/VICODIN) 5-325 MG tablet Take 1 tablet by mouth every 6 (six) hours as needed for severe pain. 11/03/17   Antony Madura, PA-C  lidocaine (LIDODERM) 5 % Place 1 patch onto the skin  daily. Remove & Discard patch within 12 hours or as directed by MD 11/03/17   Antony Madura, PA-C  ondansetron (ZOFRAN ODT) 4 MG disintegrating tablet Take 1 tablet (4 mg total) by mouth every 8 (eight) hours as needed for nausea or vomiting. Patient not taking: Reported on 11/03/2017 06/17/17   Shaune Pollack, MD    Family History Family History  Problem Relation Age of Onset  . Glaucoma Mother     Social History Social History   Tobacco Use  . Smoking status: Never Smoker  . Smokeless tobacco: Never Used  Substance Use Topics  . Alcohol use: Yes    Comment: rare  . Drug use: No     Allergies   Patient has no known allergies.   Review of Systems Review of Systems  Unable to perform ROS: Dementia     Physical Exam Updated Vital Signs BP (!) 145/67 (BP Location: Left Arm)   Pulse 61   Temp 98 F (36.7 C) (Oral)   Resp 17   Ht 5\' 9"  (1.753 m)   Wt 72.1 kg (159 lb)   SpO2 100%   BMI 23.48 kg/m   Physical Exam  Constitutional: He appears well-developed and well-nourished. No distress.  Nontoxic appearing and in no acute distress  HENT:  Head: Normocephalic and atraumatic.  Eyes: Conjunctivae and EOM are normal. No scleral icterus.  Neck:  Normal range of motion.  Cardiovascular: Normal rate, regular rhythm and intact distal pulses.  Pulmonary/Chest: Effort normal. No respiratory distress.  Respirations even and unlabored  Musculoskeletal: Normal range of motion.  TTP at midline of L2/3 without bony deformity, step-offs, crepitus.  Neurological: He is alert. He exhibits normal muscle tone. Coordination normal.  Weightbearing without assistance.  Negative straight leg raise and cross straight leg raise.  Sensation to light touch intact in bilateral lower extremities.  Skin: Skin is warm and dry. No rash noted. He is not diaphoretic. No erythema. No pallor.  Psychiatric: He has a normal mood and affect. His behavior is normal.  Nursing note and vitals  reviewed.    ED Treatments / Results  Labs (all labs ordered are listed, but only abnormal results are displayed) Labs Reviewed - No data to display  EKG  EKG Interpretation None       Radiology Dg Lumbar Spine Complete  Result Date: 11/03/2017 CLINICAL DATA:  Progressive lumbosacral back pain for the past 4 days. EXAM: LUMBAR SPINE - COMPLETE 4+ VIEW COMPARISON:  None. FINDINGS: Straightening of normal lordosis. No listhesis. Vertebral body heights are preserved. Diffuse disc space narrowing and endplate spurring throughout, most prominent at L1-L2 and L5-S1. Multilevel facet arthropathy. No acute fracture. No bony destructive change. There is atherosclerosis of the abdominal aorta. IMPRESSION: Diffuse degenerative disc disease and facet arthropathy. No acute abnormality. Electronically Signed   By: Rubye OaksMelanie  Ehinger M.D.   On: 11/03/2017 23:19    Procedures Procedures (including critical care time)  Medications Ordered in ED Medications  naproxen (NAPROSYN) tablet 500 mg (500 mg Oral Given 11/03/17 2251)  oxyCODONE-acetaminophen (PERCOCET/ROXICET) 5-325 MG per tablet 1 tablet (1 tablet Oral Given 11/03/17 2251)     Initial Impression / Assessment and Plan / ED Course  I have reviewed the triage vital signs and the nursing notes.  Pertinent labs & imaging results that were available during my care of the patient were reviewed by me and considered in my medical decision making (see chart for details).     Patient with back pain x 1 week.  Hx of dementia with no known falls or trauma.  Neurovascularly intact on assessment.  Able to weight bear and ambulate without assistance, though movement has been causing him more discomfort lately.  No loss of bowel or bladder control.  No concern for cauda equina.  No fevers, hx of cancer.  X-rays obtained which show degenerative changes without evidence of acute or subacute fracture.  Plan for continued outpatient management with  anti-inflammatories and Lidoderm patch.  Patient given a short course of Norco for additional pain control as needed.  Return precautions discussed with wife and provided. Patient discharged in stable condition.  Family with no unaddressed concerns.   Final Clinical Impressions(s) / ED Diagnoses   Final diagnoses:  Acute midline low back pain without sciatica    ED Discharge Orders        Ordered    diclofenac (VOLTAREN) 50 MG EC tablet  2 times daily     11/03/17 2348    HYDROcodone-acetaminophen (NORCO/VICODIN) 5-325 MG tablet  Every 6 hours PRN     11/03/17 2348    lidocaine (LIDODERM) 5 %  Every 24 hours     11/03/17 2348       Antony MaduraHumes, Josafat Enrico, PA-C 11/04/17 0636    Abelino DerrickMackuen, Courteney Lyn, MD 11/09/17 [phone]

## 2017-11-03 NOTE — ED Triage Notes (Addendum)
Patient brought in by wife with complaints of lower back pain that has progressively gotten worse, starting Thursday approximately. The patient has history of dementia and is a poor historian. Patient's wife is unsure of any injury or falls- but states the patient stays home by himself while she goes to work. Patient reports he is unable to stand due to too much pain in his lower back.

## 2019-08-28 ENCOUNTER — Other Ambulatory Visit: Payer: Self-pay

## 2019-08-28 ENCOUNTER — Emergency Department (HOSPITAL_COMMUNITY): Payer: Medicare Other

## 2019-08-28 ENCOUNTER — Emergency Department (HOSPITAL_COMMUNITY)
Admission: EM | Admit: 2019-08-28 | Discharge: 2019-08-28 | Disposition: A | Discharge status: Home / Self Care | Payer: Medicare Other | Attending: Emergency Medicine | Admitting: Emergency Medicine

## 2019-08-28 ENCOUNTER — Encounter (HOSPITAL_COMMUNITY): Payer: Self-pay | Admitting: Emergency Medicine

## 2019-08-28 DIAGNOSIS — Y929 Unspecified place or not applicable: Secondary | ICD-10-CM | POA: Insufficient documentation

## 2019-08-28 DIAGNOSIS — F0390 Unspecified dementia without behavioral disturbance: Secondary | ICD-10-CM | POA: Insufficient documentation

## 2019-08-28 DIAGNOSIS — Y939 Activity, unspecified: Secondary | ICD-10-CM | POA: Diagnosis not present

## 2019-08-28 DIAGNOSIS — W010XXA Fall on same level from slipping, tripping and stumbling without subsequent striking against object, initial encounter: Secondary | ICD-10-CM | POA: Insufficient documentation

## 2019-08-28 DIAGNOSIS — S2241XA Multiple fractures of ribs, right side, initial encounter for closed fracture: Secondary | ICD-10-CM | POA: Diagnosis not present

## 2019-08-28 DIAGNOSIS — Y999 Unspecified external cause status: Secondary | ICD-10-CM | POA: Diagnosis not present

## 2019-08-28 DIAGNOSIS — S299XXA Unspecified injury of thorax, initial encounter: Secondary | ICD-10-CM | POA: Diagnosis present

## 2019-08-28 MED ORDER — LIDOCAINE 5 % EX PTCH: 1.0000 | MEDICATED_PATCH | CUTANEOUS | 0 refills | Status: DC

## 2019-08-28 MED ORDER — OXYCODONE HCL 5 MG PO TABS: 5.0000 mg | ORAL_TABLET | ORAL | 0 refills | Status: DC | PRN

## 2019-08-28 NOTE — ED Provider Notes (Signed)
[facility] Emergency Department Provider Note MRN:  [id number]  Arrival date & time: 08/28/19     Chief Complaint   Flank Pain, Chest Pain, and Fall   History of Present Illness   Jacob Rich is a 86 y.o. year-old male with a history of dementia presenting to the ED with chief complaint of fall.  Patient fell 2 days ago.  [address] because his sneaker caught on the carpet.  Fell onto his right side.  No head trauma, no loss of consciousness, no blood thinners, no vomiting since the fall.  No neck or back pain.  Denies chest pain, denies abdominal pain, endorsing right lateral rib pain.  Pain is moderate, worse with palpation, constant.  Denies fever or cough, no other complaints.  History largely obtained from patient's spouse.   Review of Systems  A complete 10 system review of systems was obtained and all systems are negative except as noted in the HPI and PMH.   Patient's Health History    Past Medical History:  Diagnosis Date  . Dementia ([place])   . MRSA infection     Past Surgical History:  Procedure Laterality Date  . MRSa     2006/2007  . OTHER SURGICAL HISTORY     Dicverticulitis    Family History  Problem Relation Age of Onset  . Glaucoma Mother     Social History   Socioeconomic History  . Marital status: Married    Spouse name: Malachy Mood  . Number of children: 4  . Years of education: 39  . Highest education level: Not on file  Occupational History  . Occupation: Retired  Tobacco Use  . Smoking status: Never Smoker  . Smokeless tobacco: Never Used  Substance and Sexual Activity  . Alcohol use: Yes    Comment: rare  . Drug use: No  . Sexual activity: Not on file  Other Topics Concern  . Not on file  Social History Narrative   Lives with wife and daughter   Caffeine use: Drinks 2-3 glasses tea per day   Social Determinants of Health   Financial Resource Strain:   . Difficulty of Paying Living Expenses: Not on file  Food  Insecurity:   . Worried About Charity fundraiser in the Last Year: Not on file  . Ran Out of Food in the Last Year: Not on file  Transportation Needs:   . Lack of Transportation (Medical): Not on file  . Lack of Transportation (Non-Medical): Not on file  Physical Activity:   . Days of Exercise per Week: Not on file  . Minutes of Exercise per Session: Not on file  Stress:   . Feeling of Stress : Not on file  Social Connections:   . Frequency of Communication with Friends and Family: Not on file  . Frequency of Social Gatherings with Friends and Family: Not on file  . Attends Religious Services: Not on file  . Active Member of Clubs or Organizations: Not on file  . Attends Archivist Meetings: Not on file  . Marital Status: Not on file  Intimate Partner Violence:   . Fear of Current or Ex-Partner: Not on file  . Emotionally Abused: Not on file  . Physically Abused: Not on file  . Sexually Abused: Not on file     Physical Exam  Vital Signs and Nursing Notes reviewed Vitals:   08/28/19 1301  BP: (!) 160/71  Pulse: (!) 58  Temp: 97.8 F (36.6 C)  SpO2: 97%    CONSTITUTIONAL: Well-appearing, NAD NEURO:  Alert and oriented x 3, no focal deficits EYES:  eyes equal and reactive ENT/NECK:  no LAD, no JVD CARDIO: Regular rate, well-perfused, normal S1 and S2, focal tenderness palpation to the right lateral ribs PULM:  CTAB no wheezing or rhonchi GI/GU:  normal bowel sounds, non-distended, non-tender MSK/SPINE:  No gross deformities, no edema SKIN:  no rash, atraumatic PSYCH:  Appropriate speech and behavior  Diagnostic and Interventional Summary    EKG Interpretation  Date/Time:    Ventricular Rate:    PR Interval:    QRS Duration:   QT Interval:    QTC Calculation:   R Axis:     Text Interpretation:        Labs Reviewed - No data to display  DG Ribs Unilateral W/Chest Right  Final Result      Medications - No data to display   Procedures  /   Critical Care Procedures  ED Course and Medical Decision Making  I have reviewed the triage vital signs, the nursing notes, and pertinent available records from the EMR.  Pertinent labs & imaging results that were available during my care of the patient were reviewed by me and considered in my medical decision making (see below for details).     X-ray confirms rib fractures without pneumothorax, question of hematoma formation however patient has good breath sounds throughout the lungs, including the bases, doubt significant hemothorax, doubt liver injury especially given that patient has withstood the test of time given that the injury occurred 2 days ago.  No lightheadedness, normal vital signs, appropriate for discharge with pain control.    Elmer Sow. Pilar Plate, MD [facility] Emergency Medicine [facility] [email or web address]  Final Clinical Impressions(s) / ED Diagnoses     ICD-10-CM   1. Closed fracture of multiple ribs of right side, initial encounter  S22.41XA     ED Discharge Orders         Ordered    lidocaine (LIDODERM) 5 %  Every 24 hours     08/28/19 2011    oxyCODONE (ROXICODONE) 5 MG immediate release tablet  Every 4 hours PRN     08/28/19 2011           Discharge Instructions Discussed with and Provided to Patient:     Discharge Instructions     You were evaluated in the Emergency Department and after careful evaluation, we did not find any emergent condition requiring admission or further testing in the hospital.  Your exam/testing today was overall reassuring.  Your x-ray showed 2 broken ribs.  This will take several weeks to heal.  As discussed, it is very important to take deep breaths throughout the day to prevent pneumonia.  We recommend using Tylenol and Motrin throughout the day for pain.  We also recommend using the Lidoderm patches on the most painful site.  For pain keeping you from sleeping at night, you can use the oxycodone medication  but please use caution as this medication can cause worsened confusion in the elderly.  Please return to the Emergency Department if you experience any worsening of your condition.  We encourage you to follow up with a primary care provider.  Thank you for allowing [place] to be a part of your care.        Sabas Sous, MD 08/28/19 2016

## 2019-08-28 NOTE — Discharge Instructions (Addendum)
You were evaluated in the Emergency Department and after careful evaluation, we did not find any emergent condition requiring admission or further testing in the hospital.  Your exam/testing today was overall reassuring.  Your x-ray showed 2 broken ribs.  This will take several weeks to heal.  As discussed, it is very important to take deep breaths throughout the day to prevent pneumonia.  We recommend using Tylenol and Motrin throughout the day for pain.  We also recommend using the Lidoderm patches on the most painful site.  For pain keeping you from sleeping at night, you can use the oxycodone medication but please use caution as this medication can cause worsened confusion in the elderly.  Please return to the Emergency Department if you experience any worsening of your condition.  We encourage you to follow up with a primary care provider.  Thank you for allowing [place] to be a part of your care.

## 2019-08-28 NOTE — ED Triage Notes (Signed)
Pt fell on Thursday. C/o right rib/flank pains. No bruising noted in triage.

## 2019-09-08 ENCOUNTER — Emergency Department (HOSPITAL_COMMUNITY)
Admission: EM | Admit: 2019-09-08 | Discharge: 2019-09-08 | Disposition: A | Discharge status: Home / Self Care | Payer: Medicare Other | Attending: Emergency Medicine | Admitting: Emergency Medicine

## 2019-09-08 ENCOUNTER — Other Ambulatory Visit: Payer: Self-pay

## 2019-09-08 ENCOUNTER — Emergency Department (HOSPITAL_COMMUNITY): Payer: Medicare Other

## 2019-09-08 DIAGNOSIS — F0390 Unspecified dementia without behavioral disturbance: Secondary | ICD-10-CM | POA: Insufficient documentation

## 2019-09-08 DIAGNOSIS — R410 Disorientation, unspecified: Secondary | ICD-10-CM | POA: Diagnosis not present

## 2019-09-08 LAB — CBC
HCT: 36.6 % — ABNORMAL LOW (ref 39.0–52.0)
Hemoglobin: 12.7 g/dL — ABNORMAL LOW (ref 13.0–17.0)
MCH: 33.4 pg (ref 26.0–34.0)
MCHC: 34.7 g/dL (ref 30.0–36.0)
MCV: 96.3 fL (ref 80.0–100.0)
Platelets: 228 10*3/uL (ref 150–400)
RBC: 3.8 MIL/uL — ABNORMAL LOW (ref 4.22–5.81)
RDW: 12 % (ref 11.5–15.5)
WBC: 6.9 10*3/uL (ref 4.0–10.5)
nRBC: 0 % (ref 0.0–0.2)

## 2019-09-08 LAB — URINALYSIS, ROUTINE W REFLEX MICROSCOPIC
Bacteria, UA: NONE SEEN
Bilirubin Urine: NEGATIVE
Glucose, UA: NEGATIVE mg/dL
Hgb urine dipstick: NEGATIVE
Ketones, ur: NEGATIVE mg/dL
Nitrite: NEGATIVE
Protein, ur: NEGATIVE mg/dL
Specific Gravity, Urine: 1.008 (ref 1.005–1.030)
pH: 7 (ref 5.0–8.0)

## 2019-09-08 LAB — COMPREHENSIVE METABOLIC PANEL
ALT: 50 U/L — ABNORMAL HIGH (ref 0–44)
AST: 43 U/L — ABNORMAL HIGH (ref 15–41)
Albumin: 4 g/dL (ref 3.5–5.0)
Alkaline Phosphatase: 68 U/L (ref 38–126)
Anion gap: 9 (ref 5–15)
BUN: 20 mg/dL (ref 8–23)
CO2: 22 mmol/L (ref 22–32)
Calcium: 9 mg/dL (ref 8.9–10.3)
Chloride: 108 mmol/L (ref 98–111)
Creatinine, Ser: 1.18 mg/dL (ref 0.61–1.24)
GFR calc Af Amer: >60 mL/min (ref >60)
GFR calc non Af Amer: 56 mL/min — ABNORMAL LOW (ref >60)
Glucose, Bld: 106 mg/dL — ABNORMAL HIGH (ref 70–99)
Potassium: 4.2 mmol/L (ref 3.5–5.1)
Sodium: 139 mmol/L (ref 135–145)
Total Bilirubin: 0.8 mg/dL (ref 0.3–1.2)
Total Protein: 6.6 g/dL (ref 6.5–8.1)

## 2019-09-08 NOTE — Discharge Instructions (Addendum)
You were evaluated in the Emergency Department and after careful evaluation, we did not find any emergent condition requiring admission or further testing in the hospital. ° °Your exam/testing today was overall reassuring. ° °Please return to the Emergency Department if you experience any worsening of your condition.  We encourage you to follow up with a primary care provider.  Thank you for allowing us to be a part of your care. ° °

## 2019-09-08 NOTE — ED Provider Notes (Signed)
[facility]ENCY DEPT [facility] Emergency Department Provider Note MRN:  [id number]  Arrival date & time: 09/08/19     Chief Complaint   Confusion History of Present Illness   Jacob Rich is a 86 y.o. year-old male with a history of dementia presenting to the ED with chief complaint of confusion.  Increased confusion noticed by wife today.  Found on the floor this morning, wife did not hear her fall, has an abrasion to his nose.  Denies any other pain.  Had a fall a few weeks ago during which she broke 2 ribs.  Denies cough, no fever.  Wife thinks it is a UTI.  Saw a woman on the couch today that was not really there.  I was unable to obtain an accurate HPI, PMH, or ROS due to the patient's dementia.  Level 5 caveat.  Review of Systems  Positive for fall, confusion.  Patient's Health History    Past Medical History:  Diagnosis Date  . Dementia (HCC)   . MRSA infection     Past Surgical History:  Procedure Laterality Date  . MRSa     2006/2007  . OTHER SURGICAL HISTORY     Dicverticulitis    Family History  Problem Relation Age of Onset  . Glaucoma Mother     Social History   Socioeconomic History  . Marital status: Married    Spouse name: Jacob Rich  . Number of children: 4  . Years of education: 47  . Highest education level: Not on file  Occupational History  . Occupation: Retired  Tobacco Use  . Smoking status: Never Smoker  . Smokeless tobacco: Never Used  Substance and Sexual Activity  . Alcohol use: Yes    Comment: rare  . Drug use: No  . Sexual activity: Not on file  Other Topics Concern  . Not on file  Social History Narrative   Lives with wife and daughter   Caffeine use: Drinks 2-3 glasses tea per day   Social Determinants of Health   Financial Resource Strain:   . Difficulty of Paying Living Expenses: Not on file  Food Insecurity:   . Worried About Programme researcher, broadcasting/film/video in the Last Year: Not on file  . Ran Out of Food in the Last Year: Not  on file  Transportation Needs:   . Lack of Transportation (Medical): Not on file  . Lack of Transportation (Non-Medical): Not on file  Physical Activity:   . Days of Exercise per Week: Not on file  . Minutes of Exercise per Session: Not on file  Stress:   . Feeling of Stress : Not on file  Social Connections:   . Frequency of Communication with Friends and Family: Not on file  . Frequency of Social Gatherings with Friends and Family: Not on file  . Attends Religious Services: Not on file  . Active Member of Clubs or Organizations: Not on file  . Attends Banker Meetings: Not on file  . Marital Status: Not on file  Intimate Partner Violence:   . Fear of Current or Ex-Partner: Not on file  . Emotionally Abused: Not on file  . Physically Abused: Not on file  . Sexually Abused: Not on file     Physical Exam   Vitals:   09/08/19 2200 09/08/19 2230  BP: (!) 157/73 (!) 157/76  Pulse: 71 70  Resp: 19 16  Temp:    SpO2: 97% 98%    CONSTITUTIONAL: Well-appearing, NAD NEURO: Awake, alert,  oriented to name, moving all extremities EYES:  eyes equal and reactive ENT/NECK:  no LAD, no JVD CARDIO: Regular rate, well-perfused, normal S1 and S2 PULM:  CTAB no wheezing or rhonchi GI/GU:  normal bowel sounds, non-distended, non-tender MSK/SPINE:  No gross deformities, no edema SKIN:  no rash, atraumatic PSYCH:  Appropriate speech and behavior  *Additional and/or pertinent findings included in MDM below  Diagnostic and Interventional Summary    EKG Interpretation  Date/Time:    Ventricular Rate:    PR Interval:    QRS Duration:   QT Interval:    QTC Calculation:   R Axis:     Text Interpretation:        Labs Reviewed  CBC - Abnormal; Notable for the following components:      Result Value   RBC 3.80 (*)    Hemoglobin 12.7 (*)    HCT 36.6 (*)    All other components within normal limits  COMPREHENSIVE METABOLIC PANEL - Abnormal; Notable for the following  components:   Glucose, Bld 106 (*)    AST 43 (*)    ALT 50 (*)    GFR calc non Af Amer 56 (*)    All other components within normal limits  URINALYSIS, ROUTINE W REFLEX MICROSCOPIC - Abnormal; Notable for the following components:   Color, Urine STRAW (*)    Leukocytes,Ua TRACE (*)    All other components within normal limits    DG Chest Port 1 View  Final Result    CT HEAD WO CONTRAST  Final Result      Medications - No data to display   Procedures  /  Critical Care Procedures  ED Course and Medical Decision Making  I have reviewed the triage vital signs, the nursing notes, and pertinent available records from the EMR.  Pertinent labs & imaging results that were available during my care of the patient were reviewed by me and considered in my medical decision making (see below for details).     Considering subdural hematoma, metabolic disarray, UTI, pneumonia, work-up pending.  Patient seems to be at or near his baseline.  I personally manage the patient during his recent fall and he seems to be of similar mental status.  With a negative work-up would be appropriate for discharge.  Work-up is largely unremarkable.  Minimally elevated LFTs but no right upper quadrant tenderness, no fever.  Suspect patient's confusion due to underlying dementia.  Patient's wife still comfortable caring for the patient, will return if patient becomes worse and is unable to be cared for at home.  Barth Kirks. Sedonia Small, MD [place] [email or web address]  Final Clinical Impressions(s) / ED Diagnoses     ICD-10-CM   1. Confusion  R41.0     ED Discharge Orders    None       Discharge Instructions Discussed with and Provided to Patient:     Discharge Instructions     You were evaluated in the Emergency Department and after careful evaluation, we did not find any emergent condition requiring admission or further testing in the hospital.  Your  exam/testing today was overall reassuring.  Please return to the Emergency Department if you experience any worsening of your condition.  We encourage you to follow up with a primary care provider.  Thank you for allowing [place] to be a part of your care.        Maudie Flakes, MD 09/08/19 2246

## 2019-09-08 NOTE — ED Triage Notes (Signed)
Per EMS: Pt is coming from home with c/o alerted mental status. Patients wife states he has early dementia and appears to be a lot more confused than normal. Pt has no neuro deficits and his speech is normal. Patient was ambulatory with EMS. Pt is AxOx1, oriented to name. Pt states he is currently in [place] and just got out of the [employer] and refers to his wife as his girlfriend. Hx of UTI's.   VITALS: BP 134/82 HR 85 SPO2 98% CBG 134

## 2019-09-08 NOTE — ED Notes (Signed)
Family at bedside. 

## 2020-01-04 ENCOUNTER — Emergency Department (HOSPITAL_COMMUNITY): Payer: Medicare Other

## 2020-01-04 ENCOUNTER — Inpatient Hospital Stay (HOSPITAL_COMMUNITY)
Admission: EM | Admit: 2020-01-04 | Discharge: 2020-01-06 | DRG: 690 | Disposition: A | Discharge status: Home / Self Care | Payer: Medicare Other | Attending: Internal Medicine | Admitting: Internal Medicine

## 2020-01-04 ENCOUNTER — Ambulatory Visit (INDEPENDENT_AMBULATORY_CARE_PROVIDER_SITE_OTHER)
Admission: EM | Admit: 2020-01-04 | Discharge: 2020-01-04 | Disposition: A | Discharge status: Home / Self Care | Payer: Medicare Other | Source: Home / Self Care

## 2020-01-04 ENCOUNTER — Encounter (HOSPITAL_COMMUNITY): Payer: Self-pay

## 2020-01-04 ENCOUNTER — Other Ambulatory Visit: Payer: Self-pay

## 2020-01-04 DIAGNOSIS — R17 Unspecified jaundice: Secondary | ICD-10-CM

## 2020-01-04 DIAGNOSIS — F0391 Unspecified dementia with behavioral disturbance: Secondary | ICD-10-CM

## 2020-01-04 DIAGNOSIS — Z79899 Other long term (current) drug therapy: Secondary | ICD-10-CM

## 2020-01-04 DIAGNOSIS — N3001 Acute cystitis with hematuria: Principal | ICD-10-CM

## 2020-01-04 DIAGNOSIS — R509 Fever, unspecified: Secondary | ICD-10-CM | POA: Diagnosis not present

## 2020-01-04 DIAGNOSIS — R4182 Altered mental status, unspecified: Secondary | ICD-10-CM | POA: Diagnosis present

## 2020-01-04 DIAGNOSIS — Z87440 Personal history of urinary (tract) infections: Secondary | ICD-10-CM | POA: Diagnosis not present

## 2020-01-04 DIAGNOSIS — Z20822 Contact with and (suspected) exposure to covid-19: Secondary | ICD-10-CM | POA: Diagnosis present

## 2020-01-04 DIAGNOSIS — N390 Urinary tract infection, site not specified: Secondary | ICD-10-CM | POA: Diagnosis present

## 2020-01-04 DIAGNOSIS — Z1624 Resistance to multiple antibiotics: Secondary | ICD-10-CM | POA: Diagnosis present

## 2020-01-04 DIAGNOSIS — Z83511 Family history of glaucoma: Secondary | ICD-10-CM

## 2020-01-04 DIAGNOSIS — Z8614 Personal history of Methicillin resistant Staphylococcus aureus infection: Secondary | ICD-10-CM

## 2020-01-04 DIAGNOSIS — F0390 Unspecified dementia without behavioral disturbance: Secondary | ICD-10-CM | POA: Diagnosis present

## 2020-01-04 LAB — CBC WITH DIFFERENTIAL/PLATELET
Abs Immature Granulocytes: 0.06 10*3/uL (ref 0.00–0.07)
Basophils Absolute: 0 10*3/uL (ref 0.0–0.1)
Basophils Relative: 0 %
Eosinophils Absolute: 0.1 10*3/uL (ref 0.0–0.5)
Eosinophils Relative: 1 %
HCT: 39.7 % (ref 39.0–52.0)
Hemoglobin: 13.3 g/dL (ref 13.0–17.0)
Immature Granulocytes: 1 %
Lymphocytes Relative: 5 %
Lymphs Abs: 0.6 10*3/uL — ABNORMAL LOW (ref 0.7–4.0)
MCH: 32.8 pg (ref 26.0–34.0)
MCHC: 33.5 g/dL (ref 30.0–36.0)
MCV: 97.8 fL (ref 80.0–100.0)
Monocytes Absolute: 0.9 10*3/uL (ref 0.1–1.0)
Monocytes Relative: 7 %
Neutro Abs: 11.6 10*3/uL — ABNORMAL HIGH (ref 1.7–7.7)
Neutrophils Relative %: 86 %
Platelets: 201 10*3/uL (ref 150–400)
RBC: 4.06 MIL/uL — ABNORMAL LOW (ref 4.22–5.81)
RDW: 12.2 % (ref 11.5–15.5)
WBC: 13.3 10*3/uL — ABNORMAL HIGH (ref 4.0–10.5)
nRBC: 0 % (ref 0.0–0.2)

## 2020-01-04 LAB — URINALYSIS, ROUTINE W REFLEX MICROSCOPIC
Bacteria, UA: NONE SEEN
Bilirubin Urine: NEGATIVE
Glucose, UA: NEGATIVE mg/dL
Ketones, ur: 5 mg/dL — AB
Nitrite: NEGATIVE
Protein, ur: 30 mg/dL — AB
Specific Gravity, Urine: 1.023 (ref 1.005–1.030)
WBC, UA: >50 WBC/hpf — ABNORMAL HIGH (ref 0–5)
pH: 5 (ref 5.0–8.0)

## 2020-01-04 LAB — COMPREHENSIVE METABOLIC PANEL
ALT: 23 U/L (ref 0–44)
AST: 27 U/L (ref 15–41)
Albumin: 4 g/dL (ref 3.5–5.0)
Alkaline Phosphatase: 90 U/L (ref 38–126)
Anion gap: 9 (ref 5–15)
BUN: 20 mg/dL (ref 8–23)
CO2: 24 mmol/L (ref 22–32)
Calcium: 9 mg/dL (ref 8.9–10.3)
Chloride: 105 mmol/L (ref 98–111)
Creatinine, Ser: 1.29 mg/dL — ABNORMAL HIGH (ref 0.61–1.24)
GFR calc Af Amer: 57 mL/min — ABNORMAL LOW (ref >60)
GFR calc non Af Amer: 50 mL/min — ABNORMAL LOW (ref >60)
Glucose, Bld: 118 mg/dL — ABNORMAL HIGH (ref 70–99)
Potassium: 4.3 mmol/L (ref 3.5–5.1)
Sodium: 138 mmol/L (ref 135–145)
Total Bilirubin: 1.6 mg/dL — ABNORMAL HIGH (ref 0.3–1.2)
Total Protein: 6.5 g/dL (ref 6.5–8.1)

## 2020-01-04 LAB — LACTIC ACID, PLASMA: Lactic Acid, Venous: 1.6 mmol/L (ref 0.5–1.9)

## 2020-01-04 MED ORDER — SODIUM CHLORIDE 0.9 % IV BOLUS
1000.0000 mL | Freq: Once | INTRAVENOUS | Status: AC
  Administered 2020-01-04: 1000 mL via INTRAVENOUS

## 2020-01-04 MED ORDER — SODIUM CHLORIDE 0.9% FLUSH
3.0000 mL | Freq: Once | INTRAVENOUS | Status: AC
  Administered 2020-01-04: 3 mL via INTRAVENOUS

## 2020-01-04 NOTE — ED Notes (Signed)
1 set of blood cultures drawn in triage.

## 2020-01-04 NOTE — ED Provider Notes (Signed)
[facility]    CSN: [id number] Arrival date & time: 01/04/20  1156      History   Chief Complaint Chief Complaint  Patient presents with  . Urinary Tract Infection    HPI Jacob Rich is a 86 y.o. male with history of recurrent UTI, dementia presenting with his wife for evaluation of UTI.  Wife provides history: States patient gets UTIs annually.  States that he had a fever this morning which prompted today's visit.  Has noticed increased urinary urgency and frequency.  No hematuria, change in bowel, pain reported.   Past Medical History:  Diagnosis Date  . Dementia (HCC)   . MRSA infection     Patient Active Problem List   Diagnosis Date Noted  . UTI (urinary tract infection) 06/17/2017  . Traumatic closed displaced fracture of shoulder with anterior dislocation with routine healing 06/10/2016  . Dementia with behavioral disturbance (HCC) 04/16/2016    Past Surgical History:  Procedure Laterality Date  . MRSa     2006/2007  . OTHER SURGICAL HISTORY     Dicverticulitis       Home Medications    Prior to Admission medications   Medication Sig Start Date End Date Taking? Authorizing Provider  acetaminophen (TYLENOL) 325 MG tablet Take 650 mg by mouth every 6 (six) hours as needed for mild pain.    [provider]  donepezil (ARICEPT) 10 MG tablet Take 1 tablet (10 mg total) by mouth at bedtime. Patient taking differently: Take 10 mg by mouth daily.  04/16/16   Anson Fret, MD  folic acid (FOLVITE) 1 MG tablet Take 1 mg by mouth daily. 08/18/19   [provider]  lidocaine (LIDODERM) 5 % Place 1 patch onto the skin daily. Remove & Discard patch within 12 hours or as directed by MD 08/28/19   Sabas Sous, MD    Family History Family History  Problem Relation Age of Onset  . Glaucoma Mother     Social History Social History   Tobacco Use  . Smoking status: Never Smoker  . Smokeless tobacco: Never Used  Substance Use  Topics  . Alcohol use: Not Currently    Comment: rare  . Drug use: No     Allergies   Patient has no known allergies.   Review of Systems As per HPI   Physical Exam Triage Vital Signs ED Triage Vitals  Enc Vitals Group     BP 01/04/20 1217 124/70     Pulse Rate 01/04/20 1217 85     Resp 01/04/20 1217 20     Temp 01/04/20 1217 100.2 F (37.9 C)     Temp Source 01/04/20 1217 Oral     SpO2 01/04/20 1217 97 %     Weight --      Height --      Head Circumference --      Peak Flow --      Pain Score 01/04/20 1223 0     Pain Loc --      Pain Edu? --      Excl. in GC? --    No data found.  Updated Vital Signs BP 124/70 (BP Location: Right Arm)   Pulse 85   Temp 100.2 F (37.9 C) (Oral)   Resp 20   SpO2 97%   Visual Acuity Right Eye Distance:   Left Eye Distance:   Bilateral Distance:    Right Eye Near:   Left Eye Near:  Bilateral Near:     Physical Exam Constitutional:      General: He is not in acute distress.    Appearance: He is not toxic-appearing.  HENT:     Head: Normocephalic and atraumatic.  Eyes:     General: No scleral icterus.    Pupils: Pupils are equal, round, and reactive to light.  Cardiovascular:     Rate and Rhythm: Normal rate.  Pulmonary:     Effort: Pulmonary effort is normal. No respiratory distress.     Breath sounds: No wheezing.  Abdominal:     Palpations: Abdomen is soft.     Tenderness: There is no abdominal tenderness.  Musculoskeletal:     Right lower leg: No edema.     Left lower leg: No edema.  Skin:    Coloration: Skin is not jaundiced or pale.  Neurological:     Mental Status: He is alert.     Comments: Pleasantly demented      UC Treatments / Results  Labs (all labs ordered are listed, but only abnormal results are displayed) Labs Reviewed - No data to display  EKG   Radiology No results found.  Procedures Procedures (including critical care time)  Medications Ordered in UC Medications - No  data to display  Initial Impression / Assessment and Plan / UC Course  I have reviewed the triage vital signs and the nursing notes.  Pertinent labs & imaging results that were available during my care of the patient were reviewed by me and considered in my medical decision making (see chart for details).     Patient with elevated temperature, though nontoxic.  No systemic symptoms reported, though history is limited second to patient's dementia.  Patient and wife spent greater than 1 hour in department attempting to produce urine: Unable to do so.  Extensive chart review showing patient used to be followed by urology for UTI.  Last appointment was in 2019-recommended urine culture prior to treatment should he have UTI symptoms.  Last urine culture on file from 06/17/2017: Klebsiella pneumonia that was resistant to all antibiotics except imipenem and gentamicin.  Discussed previous culture results with wife and voiced concern that patient is at increased risk for K pneumonia UTI that is refractory to outpatient/oral antibiotics.  Wife electing to go to ER for further evaluation.  Return precautions discussed, wife verbalized understanding and is agreeable to plan. Final Clinical Impressions(s) / UC Diagnoses   Final diagnoses:  Fever, unspecified fever cause  History of UTI  Dementia with behavioral disturbance, unspecified dementia type [facility])   Discharge Instructions   None    ED Prescriptions    None     PDMP not reviewed this encounter.   Hall[place], [place], [place] 01/04/20 1612

## 2020-01-04 NOTE — ED Triage Notes (Signed)
Pt arrives to ED from UC w/ c/o UTI. Sent here for IV ABX. Hx dementia. Pt endorses fever at UC, 98.3 F in triage.

## 2020-01-04 NOTE — ED Triage Notes (Signed)
Per wife pt has hx of UTI every year. States he is having urinary urgencies and unable to make it to the bathroom on time. Denies pain on urination.

## 2020-01-05 ENCOUNTER — Inpatient Hospital Stay (HOSPITAL_COMMUNITY): Payer: Medicare Other

## 2020-01-05 DIAGNOSIS — R17 Unspecified jaundice: Secondary | ICD-10-CM | POA: Diagnosis present

## 2020-01-05 DIAGNOSIS — Z1624 Resistance to multiple antibiotics: Secondary | ICD-10-CM | POA: Diagnosis present

## 2020-01-05 DIAGNOSIS — N390 Urinary tract infection, site not specified: Secondary | ICD-10-CM

## 2020-01-05 DIAGNOSIS — Z20822 Contact with and (suspected) exposure to covid-19: Secondary | ICD-10-CM | POA: Diagnosis present

## 2020-01-05 DIAGNOSIS — F0390 Unspecified dementia without behavioral disturbance: Secondary | ICD-10-CM | POA: Diagnosis present

## 2020-01-05 DIAGNOSIS — Z8614 Personal history of Methicillin resistant Staphylococcus aureus infection: Secondary | ICD-10-CM | POA: Diagnosis not present

## 2020-01-05 DIAGNOSIS — N3001 Acute cystitis with hematuria: Secondary | ICD-10-CM | POA: Diagnosis present

## 2020-01-05 DIAGNOSIS — Z83511 Family history of glaucoma: Secondary | ICD-10-CM | POA: Diagnosis not present

## 2020-01-05 DIAGNOSIS — Z79899 Other long term (current) drug therapy: Secondary | ICD-10-CM | POA: Diagnosis not present

## 2020-01-05 DIAGNOSIS — R4182 Altered mental status, unspecified: Secondary | ICD-10-CM | POA: Diagnosis present

## 2020-01-05 DIAGNOSIS — Z87440 Personal history of urinary (tract) infections: Secondary | ICD-10-CM | POA: Diagnosis not present

## 2020-01-05 DIAGNOSIS — F0391 Unspecified dementia with behavioral disturbance: Secondary | ICD-10-CM | POA: Diagnosis not present

## 2020-01-05 LAB — URINE CULTURE

## 2020-01-05 LAB — SARS CORONAVIRUS 2 BY RT PCR (HOSPITAL ORDER, PERFORMED IN ~~LOC~~ HOSPITAL LAB): SARS Coronavirus 2: NEGATIVE

## 2020-01-05 LAB — PROCALCITONIN: Procalcitonin: <0.1 ng/mL

## 2020-01-05 MED ORDER — DONEPEZIL HCL 10 MG PO TABS
10.0000 mg | ORAL_TABLET | Freq: Every day | ORAL | Status: DC
  Administered 2020-01-05 – 2020-01-06 (×2): 10 mg via ORAL
  Filled 2020-01-05 (×2): qty 1

## 2020-01-05 MED ORDER — ENOXAPARIN SODIUM 40 MG/0.4ML ~~LOC~~ SOLN
40.0000 mg | SUBCUTANEOUS | Status: DC
  Administered 2020-01-06: 40 mg via SUBCUTANEOUS
  Filled 2020-01-05: qty 0.4

## 2020-01-05 MED ORDER — ACETAMINOPHEN 650 MG RE SUPP: 650.0000 mg | Freq: Four times a day (QID) | RECTAL | Status: DC | PRN

## 2020-01-05 MED ORDER — FOLIC ACID 1 MG PO TABS
1.0000 mg | ORAL_TABLET | Freq: Every day | ORAL | Status: DC
  Administered 2020-01-05 – 2020-01-06 (×2): 1 mg via ORAL
  Filled 2020-01-05 (×2): qty 1

## 2020-01-05 MED ORDER — ACETAMINOPHEN 325 MG PO TABS: 650.0000 mg | ORAL_TABLET | Freq: Four times a day (QID) | ORAL | Status: DC | PRN

## 2020-01-05 MED ORDER — SODIUM CHLORIDE 0.9 % IV SOLN
500.0000 mg | Freq: Two times a day (BID) | INTRAVENOUS | Status: DC
  Administered 2020-01-05: 500 mg via INTRAVENOUS
  Filled 2020-01-05 (×4): qty 0.5

## 2020-01-05 MED ORDER — SODIUM CHLORIDE 0.9 % IV SOLN
1.0000 g | Freq: Two times a day (BID) | INTRAVENOUS | Status: DC
  Administered 2020-01-05 – 2020-01-06 (×2): 1 g via INTRAVENOUS
  Filled 2020-01-05 (×3): qty 1

## 2020-01-05 NOTE — Plan of Care (Signed)

## 2020-01-05 NOTE — H&P (Signed)
History and Physical    Jacob Rich ERX:[id number] DOB: 1931/08/18 DOA: 01/04/2020  PCP: Lucianne Lei, MD Patient coming from: Home  Chief Complaint: Concern for UTI  HPI: Jacob Rich is a 86 y.o. male with medical history significant of dementia, recurrent UTI presenting to the ED with his wife due to concern for UTI.  No history could be obtained from the patient given his dementia.  He is not sure why he is here.  Wife reported to ED provider that patient gets UTIs annually.  He had a fever this morning.  He has had urinary frequency and urgency.  No hematuria, change in bowel habits, or pain reported.  No cough or congestion reported.  ED Course: Febrile with temperature 100.2 F, remainder of vital signs stable.  Labs showing mild leukocytosis with WBC count 13.3.  Lactic acid normal.  Creatinine 1.2, at baseline.  T bili 1.6, remainder of LFTs normal.  UA with large amount of leukocytes and greater than 50 WBCs.  Urine culture pending.  Chest x-ray showing questionable subtle peripheral groundglass opacities in the left greater than right thoraces as may be seen with atypical or viral process.  Otherwise negative.  SARS-CoV-2 PCR test negative.  Patient received 1 L normal saline bolus.  Review of Systems:  All systems reviewed and apart from history of presenting illness, are negative.  Past Medical History:  Diagnosis Date  . Dementia ([place])   . MRSA infection     Past Surgical History:  Procedure Laterality Date  . MRSa     2006/2007  . OTHER SURGICAL HISTORY     Dicverticulitis     reports that he has never smoked. He has never used smokeless tobacco. He reports previous alcohol use. He reports that he does not use drugs.  No Known Allergies  Family History  Problem Relation Age of Onset  . Glaucoma Mother     Prior to Admission medications   Medication Sig Start Date End Date Taking? Authorizing Provider  acetaminophen (TYLENOL) 325 MG tablet Take 650 mg by  mouth every 6 (six) hours as needed for mild pain.    [provider]  donepezil (ARICEPT) 10 MG tablet Take 1 tablet (10 mg total) by mouth at bedtime. Patient taking differently: Take 10 mg by mouth daily.  04/16/16   Melvenia Beam, MD  folic acid (FOLVITE) 1 MG tablet Take 1 mg by mouth daily. 08/18/19   [provider]  lidocaine (LIDODERM) 5 % Place 1 patch onto the skin daily. Remove & Discard patch within 12 hours or as directed by MD 08/28/19   Maudie Flakes, MD    Physical Exam: Vitals:   01/04/20 1919 01/04/20 2221 01/04/20 2226 01/04/20 2245  BP: 126/89  128/60 127/62  Pulse: 93  80 77  Resp: 18  18   Temp:  100.2 F (37.9 C) 98.9 F (37.2 C)   TempSrc:  Oral Oral   SpO2: 100%  98% 98%  Weight:      Height:        Physical Exam  Constitutional: No distress.  HENT:  Head: Normocephalic.  Eyes: Right eye exhibits no discharge. Left eye exhibits no discharge.  Cardiovascular: Normal rate, regular rhythm and intact distal pulses.  Pulmonary/Chest: Effort normal. No respiratory distress. He has no wheezes. He has no rales.  Abdominal: Soft. Bowel sounds are normal. He exhibits no distension. There is no abdominal tenderness. There is no guarding.  Musculoskeletal:  General: No edema.     Cervical back: Neck supple.  Neurological:  Awake and alert Oriented to self only  Skin: Skin is warm and dry. He is not diaphoretic.    Labs on Admission: I have personally reviewed following labs and imaging studies  CBC: Recent Labs  Lab 01/04/20 1412  WBC 13.3*  NEUTROABS 11.6*  HGB 13.3  HCT 39.7  MCV 97.8  PLT 201   Basic Metabolic Panel: Recent Labs  Lab 01/04/20 1412  NA 138  K 4.3  CL 105  CO2 24  GLUCOSE 118*  BUN 20  CREATININE 1.29*  CALCIUM 9.0   GFR: Estimated Creatinine Clearance: 38.8 mL/min (A) (by C-G formula based on SCr of 1.29 mg/dL (H)). Liver Function Tests: Recent Labs  Lab 01/04/20 1412  AST 27  ALT 23    ALKPHOS 90  BILITOT 1.6*  PROT 6.5  ALBUMIN 4.0   No results for input(s): LIPASE, AMYLASE in the last 168 hours. No results for input(s): AMMONIA in the last 168 hours. Coagulation Profile: No results for input(s): INR, PROTIME in the last 168 hours. Cardiac Enzymes: No results for input(s): CKTOTAL, CKMB, CKMBINDEX, TROPONINI in the last 168 hours. BNP (last 3 results) No results for input(s): PROBNP in the last 8760 hours. HbA1C: No results for input(s): HGBA1C in the last 72 hours. CBG: No results for input(s): GLUCAP in the last 168 hours. Lipid Profile: No results for input(s): CHOL, HDL, LDLCALC, TRIG, CHOLHDL, LDLDIRECT in the last 72 hours. Thyroid Function Tests: No results for input(s): TSH, T4TOTAL, FREET4, T3FREE, THYROIDAB in the last 72 hours. Anemia Panel: No results for input(s): VITAMINB12, FOLATE, FERRITIN, TIBC, IRON, RETICCTPCT in the last 72 hours. Urine analysis:    Component Value Date/Time   COLORURINE YELLOW 01/04/2020 2245   APPEARANCEUR CLOUDY (A) 01/04/2020 2245   LABSPEC 1.023 01/04/2020 2245   PHURINE 5.0 01/04/2020 2245   GLUCOSEU NEGATIVE 01/04/2020 2245   HGBUR MODERATE (A) 01/04/2020 2245   BILIRUBINUR NEGATIVE 01/04/2020 2245   KETONESUR 5 (A) 01/04/2020 2245   PROTEINUR 30 (A) 01/04/2020 2245   NITRITE NEGATIVE 01/04/2020 2245   LEUKOCYTESUR LARGE (A) 01/04/2020 2245    Radiological Exams on Admission: DG Chest Portable 1 View  Result Date: 01/05/2020 CLINICAL DATA:  Fever EXAM: PORTABLE CHEST 1 VIEW COMPARISON:  09/08/2019 FINDINGS: Low lung volumes. Questionable peripheral ground-glass opacities within the left greater than right lungs. Stable cardiomediastinal silhouette. No pneumothorax. IMPRESSION: Questionable subtle peripheral ground-glass opacities in the left greater than right thoraces as may be seen with atypical or viral process. Otherwise negative. Electronically Signed   By: Jasmine Pang M.D.   On: 01/05/2020 00:28     Assessment/Plan Principal Problem:   Complicated UTI (urinary tract infection) Active Problems:   Serum total bilirubin elevated   Dementia (HCC)   Complicated UTI: Febrile with temperature 100.2 F and labs showing mild leukocytosis with WBC count 13.3.  Lactic acid normal.  No signs of sepsis. UA with large amount of leukocytes and greater than 50 WBCs.  Prior urine culture growing multidrug-resistant Klebsiella sensitive to Carbapenems. -Start meropenem given fever and leukocytosis.  Urine culture pending.  Tylenol as needed for fevers.  Elevated T bili: Bili 1.6, remainder of LFTs normal.  Abdominal exam benign. -Right upper quadrant ultrasound  ?PNA: Chest x-ray showing questionable subtle peripheral groundglass opacities in the left greater than right thoraces as may be seen with atypical or viral process.  Pneumonia less likely as SARS-CoV-2 PCR  test negative and wife did not report any respiratory symptoms.  No tachypnea, hypoxia, or signs of respiratory distress. -Continue antibiotic as above.  Check procalcitonin level.  Dementia: Resume Aricept after pharmacy med rec is done.  DVT prophylaxis: Lovenox Code Status: Full code Family Communication: No family available at this time. Disposition Plan: Status is: Inpatient  Remains inpatient appropriate because:IV treatments appropriate due to intensity of illness or inability to take PO   Dispo: The patient is from: Home              Anticipated d/c is to: Home              Anticipated d/c date is: 3 days              Patient currently is not medically stable to d/c.  The medical decision making on this patient was of high complexity and the patient is at high risk for clinical deterioration, therefore this is a level 3 visit.  John Giovanni MD [facility]  If 7PM-7AM, please contact night-coverage [email or web address]  01/05/2020, 2:07 AM

## 2020-01-05 NOTE — Progress Notes (Signed)
Pharmacy Antibiotic Note  Jacob Rich is a 86 y.o. male admitted on 01/04/2020 with UTI.  Pharmacy has been consulted for meropenem dosing. Pt with Tmax 100.2 and WBC is elevated at 13.3. SCr is also slightly elevated. Pt with history of ESBL UTI.   Plan: Change meropenem to 1gm IV Q12H F/u renal fxn, C&S, clinical status   Height: 5\' 10"  (177.8 cm) Weight: 76.7 kg (169 lb 1.5 oz) IBW/kg (Calculated) : 73  Temp (24hrs), Avg:99.3 F (37.4 C), Min:98.3 F (36.8 C), Max:100.2 F (37.9 C)  Recent Labs  Lab 01/04/20 1412  WBC 13.3*  CREATININE 1.29*  LATICACIDVEN 1.6    Estimated Creatinine Clearance: 41.7 mL/min (A) (by C-G formula based on SCr of 1.29 mg/dL (H)).    No Known Allergies  Antimicrobials this admission: Mero 5/19>>  Microbiology results: Pending  Thank you for allowing pharmacy to be a part of this patient's care.  Jacob Rich, 01/06/20 01/05/2020 9:58 AM

## 2020-01-05 NOTE — ED Provider Notes (Signed)
[facility] EMERGENCY DEPARTMENT Provider Note   CSN: [id number] Arrival date & time: 01/04/20  1348     History Chief Complaint  Patient presents with  . Urinary Tract Infection    Srihan Brutus is a 86 y.o. male.  HPI  Patient is a 85 year old male with history of dementia presented today with urinary incontinence.  Per wife at bedside patient has been peeing frequently which happens when he has urinary tract infections which occurs frequently for him.  Wife at bedside and provides majority of history as patient is pleasantly demented.  Patient has had increased frequency and urgency.  No hematuria no changes in bowel movements.  Wife does state that patient has complained of urinary symptoms however patient is currently denying any issues and is not entirely certain why he is in the ED.  Wife denies any coughing, congestion.   Level 4 caveat secondary to altered mental status/dementia     Past Medical History:  Diagnosis Date  . Dementia (HCC)   . MRSA infection     Patient Active Problem List   Diagnosis Date Noted  . Complicated UTI (urinary tract infection) 01/05/2020  . UTI (urinary tract infection) 06/17/2017  . Traumatic closed displaced fracture of shoulder with anterior dislocation with routine healing 06/10/2016  . Dementia with behavioral disturbance (HCC) 04/16/2016    Past Surgical History:  Procedure Laterality Date  . MRSa     2006/2007  . OTHER SURGICAL HISTORY     Dicverticulitis       Family History  Problem Relation Age of Onset  . Glaucoma Mother     Social History   Tobacco Use  . Smoking status: Never Smoker  . Smokeless tobacco: Never Used  Substance Use Topics  . Alcohol use: Not Currently    Comment: rare  . Drug use: No    Home Medications Prior to Admission medications   Medication Sig Start Date End Date Taking? Authorizing Provider  acetaminophen (TYLENOL) 325 MG tablet Take 650 mg by mouth every 6  (six) hours as needed for mild pain.    [provider]  donepezil (ARICEPT) 10 MG tablet Take 1 tablet (10 mg total) by mouth at bedtime. Patient taking differently: Take 10 mg by mouth daily.  04/16/16   Anson Fret, MD  folic acid (FOLVITE) 1 MG tablet Take 1 mg by mouth daily. 08/18/19   [provider]  lidocaine (LIDODERM) 5 % Place 1 patch onto the skin daily. Remove & Discard patch within 12 hours or as directed by MD 08/28/19   Sabas Sous, MD    Allergies    Patient has no known allergies.  Review of Systems   Review of Systems  Unable to perform ROS: Dementia    Physical Exam Updated Vital Signs BP 127/62   Pulse 77   Temp 98.9 F (37.2 C) (Oral)   Resp 18   Ht 5\' 9"  (1.753 m)   Wt 68 kg   SpO2 98%   BMI 22.15 kg/m   Physical Exam Vitals and nursing note reviewed.  Constitutional:      General: He is not in acute distress.    Comments: Patient is pleasantly demented 86 year old male in no acute distress.  Patient is covered in urine.  After changing patient he urinated immediately during exam  HENT:     Head: Normocephalic and atraumatic.     Nose: Nose normal.     Mouth/Throat:  Mouth: Mucous membranes are dry.  Eyes:     General: No scleral icterus. Cardiovascular:     Rate and Rhythm: Normal rate and regular rhythm.     Pulses: Normal pulses.     Heart sounds: Normal heart sounds.  Pulmonary:     Effort: Pulmonary effort is normal. No respiratory distress.     Breath sounds: No wheezing.  Abdominal:     Palpations: Abdomen is soft.     Tenderness: There is abdominal tenderness. There is no right CVA tenderness, left CVA tenderness, guarding or rebound.     Comments: Mild suprapubic tenderness to palpation.  No guarding, rebound, no CVA tenderness.  No other tenderness to palpation of the abdomen.  Genitourinary:    Penis: Normal.      Testes: Normal.  Musculoskeletal:     Cervical back: Normal range of motion.     Right  lower leg: No edema.     Left lower leg: No edema.  Skin:    General: Skin is warm and dry.     Capillary Refill: Capillary refill takes less than 2 seconds.  Neurological:     Mental Status: He is alert. Mental status is at baseline.  Psychiatric:        Mood and Affect: Mood normal.        Behavior: Behavior normal.     ED Results / Procedures / Treatments   Labs (all labs ordered are listed, but only abnormal results are displayed) Labs Reviewed  COMPREHENSIVE METABOLIC PANEL - Abnormal; Notable for the following components:      Result Value   Glucose, Bld 118 (*)    Creatinine, Ser 1.29 (*)    Total Bilirubin 1.6 (*)    GFR calc non Af Amer 50 (*)    GFR calc Af Amer 57 (*)    All other components within normal limits  CBC WITH DIFFERENTIAL/PLATELET - Abnormal; Notable for the following components:   WBC 13.3 (*)    RBC 4.06 (*)    Neutro Abs 11.6 (*)    Lymphs Abs 0.6 (*)    All other components within normal limits  URINALYSIS, ROUTINE W REFLEX MICROSCOPIC - Abnormal; Notable for the following components:   APPearance CLOUDY (*)    Hgb urine dipstick MODERATE (*)    Ketones, ur 5 (*)    Protein, ur 30 (*)    Leukocytes,Ua LARGE (*)    WBC, UA >50 (*)    All other components within normal limits  URINE CULTURE  SARS CORONAVIRUS 2 BY RT PCR (HOSPITAL ORDER, [place] [facility])  LACTIC ACID, PLASMA    EKG None  Radiology No results found.  Procedures Procedures (including critical care time)  Medications Ordered in ED Medications  sodium chloride flush (NS) 0.9 % injection 3 mL (3 mLs Intravenous Given 01/04/20 2220)  sodium chloride 0.9 % bolus 1,000 mL (1,000 mLs Intravenous New Bag/Given 01/04/20 2209)    ED Course  I have reviewed the triage vital signs and the nursing notes.  Pertinent labs & imaging results that were available during my care of the patient were reviewed by me and considered in my medical decision making (see  chart for details).  Patient is a 86 year old male with a history of complicated UTIs with pan resistant Klebsiella pneumonia UTI several years ago with culture which required meropenem administration.  I am unable to take a history from the patient as he is demented however his wife  states that he has been urinating everywhere which is similar to how he was presenting when he had his last UTI.  Apparently patient was to be evaluated by urology in the past however he refused to be seen by them.  Patient noted to have mildly elevated temperature of 100.2.  He is not tachycardic hypotensive or ill-appearing.  Clinical Course as of Jan 04 25  Tue Jan 04, 2020  [id number] year old male with history of dementia and lives at home with wife.  She brought him in for frequent urination.  History of complicated UTIs.  Had a low-grade fever and has an elevated white count here.  Review of prior urine culture a few years ago showed Klebsiella that was resistant to most everything other than ertapenem.   [MB]    Clinical Course User Index [MB] Terrilee Files, MD   CMP without any acute abnormality.  Creatinine is at baseline.  No electrolyte abnormalities.  Leukocytosis at 13.3.  This is neutrophil predominant. No anemia.  Urinalysis is cloudy with moderate hemoglobin, ketones, protein and significantly large amount of leukocytes and greater than 50 WBCs.  Although there is no  bacteria not on urinalysis  given patient  history, symptoms, presentation, leukocytosis, fever and  history of complicated UTI  requiring meropenem will consult hospitalist for possible admission.  Chest x-ray evaluated myself.  There is no evidence of change from the last chest x-ray.  Stable cardiopulmonary imaging.  No evidence of infiltrate.  12:26 AM discussed case with Dr. Loney Loh of internal medicine who will admit patient for follow-up on urine culture results.  Patient does not appear septic at this time.  Does have mild  leukocytosis.  I discussed case my attending physician who recommended no antibiotic at this time.  MDM Rules/Calculators/A&P                      Final Clinical Impression(s) / ED Diagnoses Final diagnoses:  Acute cystitis with hematuria    Rx / DC Orders ED Discharge Orders    None       Gailen Shelter, [place] 01/05/20 0027    Terrilee Files, MD 01/05/20 1334

## 2020-01-05 NOTE — Progress Notes (Signed)
[name]  Jacob Rich  ERD:[id number] DOB: [birth year] DOA: 01/04/2020 PCP: Jacob Lei, MD    Brief Narrative:  86 year old with a history of dementia and recurrent UTIs who presented to the ED with his wife who was concerned that he had another UTI after she noted he had a fever with urinary frequency and urgency.  In the ED is found to have an elevated white count of 13.3 with creatinine at his baseline of 1.2.  UA confirmed a large amount leukocytes and greater than 50 white blood cells.  Significant Events: 5/19 admit via ED  Subjective: Pt is seen for a f/u visit.    Assessment & Plan:  Complicated UTI Prior urine culture has identified multidrug-resistant Klebsiella sensitive to Carbapenems  Elevated total bilirubin  Possible pneumonia CXR noted questionable peripheral groundglass opacities in the left greater than right lung fields - no clinical evidence of signif pulm infection at this time   Dementia  DVT prophylaxis: Lovenox Code Status: FULL CODE Family Communication: Spoke with wife at bedside Disposition Plan: Anticipate discharge home 12/20 if remains stable overnight  Consultants:  None  Antimicrobials:  Meropenem 5/18 >  Objective: Blood pressure (!) 132/49, pulse 86, temperature 98.7 F (37.1 C), temperature source Oral, resp. rate 18, height 5\' 10"  (1.778 m), weight 76.7 kg, SpO2 97 %.  Intake/Output Summary (Last 24 hours) at 01/05/2020 1038 Last data filed at 01/05/2020 0552 Gross per 24 hour  Intake 52.56 ml  Output --  Net 52.56 ml   Filed Weights   01/04/20 1403 01/05/20 0310  Weight: 68 kg 76.7 kg    Examination: Pt was seen for a f/u visit.    CBC: Recent Labs  Lab 01/04/20 1412  WBC 13.3*  NEUTROABS 11.6*  HGB 13.3  HCT 39.7  MCV 97.8  PLT 563   Basic Metabolic Panel: Recent Labs  Lab 01/04/20 1412  NA 138  K 4.3  CL 105  CO2 24  GLUCOSE 118*  BUN 20  CREATININE 1.29*  CALCIUM 9.0    GFR: Estimated Creatinine Clearance: 41.7 mL/min (A) (by C-G formula based on SCr of 1.29 mg/dL (H)).  Liver Function Tests: Recent Labs  Lab 01/04/20 1412  AST 27  ALT 23  ALKPHOS 90  BILITOT 1.6*  PROT 6.5  ALBUMIN 4.0     Recent Results (from the past 240 hour(s))  SARS Coronavirus 2 by RT PCR (hospital order, performed in [facility] hospital lab) Nasopharyngeal Nasopharyngeal Swab     Status: None   Collection Time: 01/05/20 12:00 AM   Specimen: Nasopharyngeal Swab  Result Value Ref Range Status   SARS Coronavirus 2 NEGATIVE NEGATIVE Final    Comment: (NOTE) SARS-CoV-2 target nucleic acids are NOT DETECTED. The SARS-CoV-2 RNA is generally detectable in upper and lower respiratory specimens during the acute phase of infection. The lowest concentration of SARS-CoV-2 viral copies this assay can detect is 250 copies / mL. A negative result does not preclude SARS-CoV-2 infection and should not be used as the sole basis for treatment or other patient management decisions.  A negative result may occur with improper specimen collection / handling, submission of specimen other than nasopharyngeal swab, presence of viral mutation(s) within the areas targeted by this assay, and inadequate number of viral copies (<250 copies / mL). A negative result must be combined with clinical observations, patient history, and epidemiological information. Fact Sheet for Patients:   StrictlyIdeas.no Fact Sheet for Healthcare Providers: BankingDealers.co.za This  test is not yet approved or cleared  by the [place] and has been authorized for detection and/or diagnosis of SARS-CoV-2 by FDA under an Emergency Use Authorization (EUA).  This EUA will remain in effect (meaning this test can be used) for the duration of the COVID-19 declaration under Section 564(b)(1) of the Act, 21 U.S.C. section 360bbb-3(b)(1), unless the authorization is  terminated or revoked sooner. Performed at [facility], [address], [place], [place] [address]      Scheduled Meds: . enoxaparin (LOVENOX) injection  40 mg Subcutaneous Q24H   Continuous Infusions: . meropenem (MERREM) IV       LOS: 0 days   Time spent: No Charge  Lonia Blood, MD [facility] Office  [phone] Pager - Text Page per Amion as per below:  On-Call/Text Page:      [email or web address]  If 7PM-7AM, please contact night-coverage [email or web address] 01/05/2020, 10:38 AM

## 2020-01-05 NOTE — Progress Notes (Signed)
Pharmacy Antibiotic Note  Jacob Rich is a 86 y.o. male admitted on 01/04/2020 with UTI.  Pharmacy has been consulted for meropenem dosing.  Plan: Meropenem 500 mg IV q12 hours F/u cultures and clinical course  Height: 5\' 9"  (175.3 cm) Weight: 68 kg (150 lb) IBW/kg (Calculated) : 70.7  Temp (24hrs), Avg:99.4 F (37.4 C), Min:98.3 F (36.8 C), Max:100.2 F (37.9 C)  Recent Labs  Lab 01/04/20 1412  WBC 13.3*  CREATININE 1.29*  LATICACIDVEN 1.6    Estimated Creatinine Clearance: 38.8 mL/min (A) (by C-G formula based on SCr of 1.29 mg/dL (H)).    No Known Allergies   Thank you for allowing pharmacy to be a part of this patient's care.  01/06/20 [place] 01/05/2020 2:16 AM

## 2020-01-06 MED ORDER — ACETAMINOPHEN 325 MG PO TABS: 650.0000 mg | ORAL_TABLET | Freq: Four times a day (QID) | ORAL | Status: DC | PRN

## 2020-01-06 MED ORDER — CEFDINIR 300 MG PO CAPS: 300.0000 mg | ORAL_CAPSULE | Freq: Two times a day (BID) | ORAL | 0 refills | Status: DC

## 2020-01-06 NOTE — Discharge Summary (Signed)
DISCHARGE SUMMARY  Jacob Rich  MR#: [id number]  DOB:Feb 26, 1931  Date of Admission: 01/04/2020 Date of Discharge: 01/06/2020  Attending Physician:Nil Xiong Hennie Duos, MD  Patient's KKX:FGHWE, Jacob Rude, MD  Consults: none  Disposition: d/c home w/ wife   Follow-up Appts: Follow-up Information    Lucianne Lei, MD Follow up in 1 week(s).   Specialty: Family Medicine Contact information: [address] [place] [address] [phone]           Tests Needing Follow-up: -assess for resolution of UTI symptoms   Discharge Diagnoses: Complicated UTI Possible pneumonia - doubt Dementia  Initial presentation: 86 year old with a history of dementia and recurrent UTIs who presented to the ED with his wife who was concerned that he had another UTI after she noted he had a fever with urinary frequency and urgency.  In the ED is found to have an elevated white count of 13.3 with creatinine at his baseline of 1.2.  UA confirmed a large amount leukocytes and greater than 50 white blood cells.  Hospital Course:  Complicated UTI Prior urine culture identified multidrug-resistant Klebsiella sensitive to Carbapenems but at low colony count (?colonization) - pt was tx w/ meropenem during his admit - urine cx this admit not helpful, revealing multiple species - no oral options available to cover possibility of MDR Klebsiella, but pt clinically improved and prolonging hospital stay for IV antibiotics in this case not felt to be warranted - discussed need to return to ED if infectious sx recur w/ wife - place on oral abx to complete ~5 day total tx course and d/c home   Possible pneumonia Admit CXR noted questionable peripheral groundglass opacities in the left greater than right lung fields - no clinical evidence of signif pulm infection during this admit - sats 100% on RA at time of d/c and pt afebrile   Dementia  Allergies as of 01/06/2020   No Known Allergies     Medication List     TAKE these medications   acetaminophen 325 MG tablet Commonly known as: TYLENOL Take 2 tablets (650 mg total) by mouth every 6 (six) hours as needed for mild pain (or Fever >/= 101).   cefdinir 300 MG capsule Commonly known as: OMNICEF Take 1 capsule (300 mg total) by mouth 2 (two) times daily.   donepezil 10 MG tablet Commonly known as: ARICEPT Take 1 tablet (10 mg total) by mouth at bedtime. What changed: when to take this   folic acid 1 MG tablet Commonly known as: FOLVITE Take 1 mg by mouth at bedtime.   VITAMIN B-12 PO Take 1 tablet by mouth daily with lunch.       Day of Discharge BP 130/67 (BP Location: Right Arm)   Pulse 71   Temp 98.4 F (36.9 C) (Axillary)   Resp 14   Ht 5\' 10"  (1.778 m)   Wt 76.7 kg   SpO2 100%   BMI 24.26 kg/m   Physical Exam: General: No acute respiratory distress Lungs: Clear to auscultation bilaterally without wheezes or crackles Cardiovascular: Regular rate and rhythm without murmur gallop or rub normal S1 and S2 Abdomen: Nontender, nondistended, soft, bowel sounds positive, no rebound, no ascites, no appreciable mass Extremities: No significant cyanosis, clubbing, or edema bilateral lower extremities  Basic Metabolic Panel: Recent Labs  Lab 01/04/20 1412  NA 138  K 4.3  CL 105  CO2 24  GLUCOSE 118*  BUN 20  CREATININE 1.29*  CALCIUM 9.0    Liver  Function Tests: Recent Labs  Lab 01/04/20 1412  AST 27  ALT 23  ALKPHOS 90  BILITOT 1.6*  PROT 6.5  ALBUMIN 4.0    CBC: Recent Labs  Lab 01/04/20 1412  WBC 13.3*  NEUTROABS 11.6*  HGB 13.3  HCT 39.7  MCV 97.8  PLT 201     Recent Results (from the past 240 hour(s))  Urine culture     Status: Abnormal   Collection Time: 01/04/20 10:21 PM   Specimen: Urine, Random  Result Value Ref Range Status   Specimen Description URINE, RANDOM  Final   Special Requests   Final    NONE Performed at [facility], [address] [place], [place] [address]     Culture MULTIPLE SPECIES PRESENT, SUGGEST RECOLLECTION (A)  Final   Report Status 01/05/2020 FINAL  Final  SARS Coronavirus 2 by RT PCR (hospital order, performed in [facility] hospital lab) Nasopharyngeal Nasopharyngeal Swab     Status: None   Collection Time: 01/05/20 12:00 AM   Specimen: Nasopharyngeal Swab  Result Value Ref Range Status   SARS Coronavirus 2 NEGATIVE NEGATIVE Final    Comment: (NOTE) SARS-CoV-2 target nucleic acids are NOT DETECTED. The SARS-CoV-2 RNA is generally detectable in upper and lower respiratory specimens during the acute phase of infection. The lowest concentration of SARS-CoV-2 viral copies this assay can detect is 250 copies / mL. A negative result does not preclude SARS-CoV-2 infection and should not be used as the sole basis for treatment or other patient management decisions.  A negative result may occur with improper specimen collection / handling, submission of specimen other than nasopharyngeal swab, presence of viral mutation(s) within the areas targeted by this assay, and inadequate number of viral copies (<250 copies / mL). A negative result must be combined with clinical observations, patient history, and epidemiological information. Fact Sheet for Patients:   BoilerBrush.com.cy Fact Sheet for Healthcare Providers: https://pope.com/ This test is not yet approved or cleared  by the [place] FDA and has been authorized for detection and/or diagnosis of SARS-CoV-2 by FDA under an Emergency Use Authorization (EUA).  This EUA will remain in effect (meaning this test can be used) for the duration of the COVID-19 declaration under Section 564(b)(1) of the Act, 21 U.S.C. section 360bbb-3(b)(1), unless the authorization is terminated or revoked sooner. Performed at [facility], [address] [place], [place] [address]       Time spent in discharge (includes decision making &  examination of pt): 30 minutes  01/06/2020, 11:06 AM   Lonia Blood, MD [facility] Office  [phone]

## 2020-01-06 NOTE — Discharge Instructions (Signed)

## 2020-05-28 ENCOUNTER — Other Ambulatory Visit: Payer: Self-pay

## 2020-05-28 ENCOUNTER — Encounter: Payer: Self-pay | Admitting: Emergency Medicine

## 2020-05-28 ENCOUNTER — Ambulatory Visit
Admission: EM | Admit: 2020-05-28 | Discharge: 2020-05-28 | Disposition: A | Discharge status: Home / Self Care | Payer: Medicare Other | Attending: Emergency Medicine | Admitting: Emergency Medicine

## 2020-05-28 DIAGNOSIS — S0101XA Laceration without foreign body of scalp, initial encounter: Secondary | ICD-10-CM

## 2020-05-28 DIAGNOSIS — W01190A Fall on same level from slipping, tripping and stumbling with subsequent striking against furniture, initial encounter: Secondary | ICD-10-CM | POA: Diagnosis not present

## 2020-05-28 NOTE — ED Triage Notes (Signed)
Pt with trip and fall today at home; laceration noted to top of head; bleeding controlled; pt with dementia per baseline; acting normal per family

## 2020-05-28 NOTE — ED Provider Notes (Signed)
[facility]    CSN: [id number] Arrival date & time: 05/28/20  1544      History   Chief Complaint Chief Complaint  Patient presents with  . Fall  . Head Injury    HPI Jacob Rich is a 86 y.o. male  Presents with his wife for scalp laceration.  Wife writes history: States patient hit head against a bedpost.  Denies fall, loss of consciousness, change in behavior, vomiting.  No anticoagulant/blood thinner use.  Achieved hemostasis PTA with direct pressure.  Past Medical History:  Diagnosis Date  . Dementia (HCC)   . MRSA infection     Patient Active Problem List   Diagnosis Date Noted  . Complicated UTI (urinary tract infection) 01/05/2020  . Dementia (HCC) 01/05/2020  . UTI (urinary tract infection) 06/17/2017  . Traumatic closed displaced fracture of shoulder with anterior dislocation with routine healing 06/10/2016  . Dementia with behavioral disturbance (HCC) 04/16/2016    Past Surgical History:  Procedure Laterality Date  . MRSa     2006/2007  . OTHER SURGICAL HISTORY     Dicverticulitis       Home Medications    Prior to Admission medications   Medication Sig Start Date End Date Taking? Authorizing Provider  acetaminophen (TYLENOL) 325 MG tablet Take 2 tablets (650 mg total) by mouth every 6 (six) hours as needed for mild pain (or Fever >/= 101). 01/06/20   Lonia Blood, MD  cefdinir (OMNICEF) 300 MG capsule Take 1 capsule (300 mg total) by mouth 2 (two) times daily. Patient not taking: Reported on 05/28/2020 01/06/20   Lonia Blood, MD  Cyanocobalamin (VITAMIN B-12 PO) Take 1 tablet by mouth daily with lunch.    [provider]  donepezil (ARICEPT) 10 MG tablet Take 1 tablet (10 mg total) by mouth at bedtime. Patient taking differently: Take 10 mg by mouth daily.  04/16/16   Anson Fret, MD  folic acid (FOLVITE) 1 MG tablet Take 1 mg by mouth at bedtime.  08/18/19   [provider]    Family History  Family History  Problem Relation Age of Onset  . Glaucoma Mother     Social History Social History   Tobacco Use  . Smoking status: Never Smoker  . Smokeless tobacco: Never Used  Substance Use Topics  . Alcohol use: Not Currently    Comment: rare  . Drug use: No     Allergies   Patient has no known allergies.   Review of Systems As per HPI   Physical Exam Triage Vital Signs ED Triage Vitals  Enc Vitals Group     BP      Pulse      Resp      Temp      Temp src      SpO2      Weight      Height      Head Circumference      Peak Flow      Pain Score      Pain Loc      Pain Edu?      Excl. in GC?    No data found.  Updated Vital Signs BP (!) 163/78 (BP Location: Left Arm)   Pulse 70   Temp 97.7 F (36.5 C) (Oral)   Resp 18   SpO2 98%   Visual Acuity Right Eye Distance:   Left Eye Distance:   Bilateral Distance:    Right Eye Near:  Left Eye Near:    Bilateral Near:     Physical Exam Constitutional:      General: He is not in acute distress. HENT:     Head: Normocephalic and atraumatic.  Eyes:     General: No scleral icterus.    Pupils: Pupils are equal, round, and reactive to light.  Cardiovascular:     Rate and Rhythm: Normal rate.  Pulmonary:     Effort: Pulmonary effort is normal. No respiratory distress.     Breath sounds: No wheezing.  Skin:    Coloration: Skin is not jaundiced or pale.     Comments: 3 cm partial-thickness scalpel laceration noted to top of scalp.  No contamination, active bleeding.  Neurological:     Mental Status: He is alert and oriented to person, place, and time.      UC Treatments / Results  Labs (all labs ordered are listed, but only abnormal results are displayed) Labs Reviewed - No data to display  EKG   Radiology No results found.  Procedures Laceration Repair  Date/Time: 05/28/2020 4:19 PM Performed by: Shea Evans, PA-C Authorized by: Shea Evans, PA-C   Consent:     Consent obtained:  Verbal   Consent given by:  Patient and spouse   Risks discussed:  Infection, need for additional repair, pain, poor cosmetic result and poor wound healing   Alternatives discussed:  No treatment and delayed treatment Universal protocol:    Patient identity confirmed:  Verbally with patient Anesthesia (see MAR for exact dosages):    Anesthesia method:  None Laceration details:    Location:  Scalp   Scalp location:  Mid-scalp   Length (cm):  3   Depth (mm):  4 Repair type:    Repair type:  Simple Pre-procedure details:    Preparation:  Patient was prepped and draped in usual sterile fashion Exploration:    Hemostasis achieved with:  Direct pressure   Wound exploration: wound explored through full range of motion     Contaminated: no   Treatment:    Area cleansed with:  Betadine   Amount of cleaning:  Standard   Irrigation solution:  Sterile saline   Irrigation method:  Pressure wash Skin repair:    Repair method:  Staples   Number of staples:  1 Approximation:    Approximation:  Close Post-procedure details:    Dressing:  Open (no dressing)   Patient tolerance of procedure:  Tolerated well, no immediate complications   (including critical care time)  Medications Ordered in UC Medications - No data to display  Initial Impression / Assessment and Plan / UC Course  I have reviewed the triage vital signs and the nursing notes.  Pertinent labs & imaging results that were available during my care of the patient were reviewed by me and considered in my medical decision making (see chart for details).     Single staple placed in office which patient tolerated with some difficulty.  Additional staples declined by both patient and wife.  Laceration well approximated and bleeding is controlled.  Patient and wife refused tetanus.  Reviewed wound care, will return in 10 days for removal.  Return precautions discussed, pt & wife verbalized understanding and are  agreeable to plan. Final Clinical Impressions(s) / UC Diagnoses   Final diagnoses:  Scalp laceration, initial encounter   Discharge Instructions   None    ED Prescriptions    None     PDMP not reviewed this encounter.  Hall-Potvin, [place], [place] 05/28/20 1620

## 2020-06-06 ENCOUNTER — Ambulatory Visit
Admission: RE | Admit: 2020-06-06 | Discharge: 2020-06-06 | Disposition: A | Discharge status: Home / Self Care | Payer: Medicare Other | Source: Ambulatory Visit | Attending: Emergency Medicine | Admitting: Emergency Medicine

## 2020-06-06 ENCOUNTER — Other Ambulatory Visit: Payer: Self-pay

## 2020-06-06 NOTE — ED Triage Notes (Signed)
Pt here for staple removal to top of head; one staple removed wound well healed

## 2020-06-11 ENCOUNTER — Inpatient Hospital Stay (HOSPITAL_COMMUNITY)
Admission: EM | Admit: 2020-06-11 | Discharge: 2020-06-13 | DRG: 391 | Disposition: A | Discharge status: Home Health | Payer: Medicare Other | Attending: Internal Medicine | Admitting: Internal Medicine

## 2020-06-11 ENCOUNTER — Emergency Department (HOSPITAL_COMMUNITY): Payer: Medicare Other

## 2020-06-11 ENCOUNTER — Observation Stay (HOSPITAL_COMMUNITY): Payer: Medicare Other

## 2020-06-11 ENCOUNTER — Encounter (HOSPITAL_COMMUNITY): Payer: Self-pay | Admitting: Emergency Medicine

## 2020-06-11 ENCOUNTER — Other Ambulatory Visit: Payer: Self-pay

## 2020-06-11 DIAGNOSIS — Z87440 Personal history of urinary (tract) infections: Secondary | ICD-10-CM

## 2020-06-11 DIAGNOSIS — F0391 Unspecified dementia with behavioral disturbance: Secondary | ICD-10-CM | POA: Diagnosis present

## 2020-06-11 DIAGNOSIS — R7989 Other specified abnormal findings of blood chemistry: Secondary | ICD-10-CM

## 2020-06-11 DIAGNOSIS — R4182 Altered mental status, unspecified: Secondary | ICD-10-CM

## 2020-06-11 DIAGNOSIS — J209 Acute bronchitis, unspecified: Secondary | ICD-10-CM | POA: Diagnosis present

## 2020-06-11 DIAGNOSIS — K2970 Gastritis, unspecified, without bleeding: Secondary | ICD-10-CM | POA: Diagnosis not present

## 2020-06-11 DIAGNOSIS — R112 Nausea with vomiting, unspecified: Secondary | ICD-10-CM

## 2020-06-11 DIAGNOSIS — F03918 Unspecified dementia, unspecified severity, with other behavioral disturbance: Secondary | ICD-10-CM | POA: Diagnosis present

## 2020-06-11 DIAGNOSIS — K8020 Calculus of gallbladder without cholecystitis without obstruction: Secondary | ICD-10-CM | POA: Diagnosis present

## 2020-06-11 DIAGNOSIS — Z66 Do not resuscitate: Secondary | ICD-10-CM | POA: Diagnosis present

## 2020-06-11 DIAGNOSIS — G9340 Encephalopathy, unspecified: Secondary | ICD-10-CM | POA: Diagnosis present

## 2020-06-11 DIAGNOSIS — R569 Unspecified convulsions: Secondary | ICD-10-CM | POA: Diagnosis present

## 2020-06-11 DIAGNOSIS — Z79899 Other long term (current) drug therapy: Secondary | ICD-10-CM

## 2020-06-11 DIAGNOSIS — G9341 Metabolic encephalopathy: Secondary | ICD-10-CM | POA: Diagnosis present

## 2020-06-11 DIAGNOSIS — Z20822 Contact with and (suspected) exposure to covid-19: Secondary | ICD-10-CM | POA: Diagnosis present

## 2020-06-11 LAB — URINALYSIS, ROUTINE W REFLEX MICROSCOPIC
Bilirubin Urine: NEGATIVE
Glucose, UA: NEGATIVE mg/dL
Hgb urine dipstick: NEGATIVE
Ketones, ur: NEGATIVE mg/dL
Leukocytes,Ua: NEGATIVE
Nitrite: NEGATIVE
Protein, ur: NEGATIVE mg/dL
Specific Gravity, Urine: 1.02 (ref 1.005–1.030)
pH: 6 (ref 5.0–8.0)

## 2020-06-11 LAB — PROCALCITONIN: Procalcitonin: 1.16 ng/mL

## 2020-06-11 LAB — PROTIME-INR
INR: 1.1 (ref 0.8–1.2)
Prothrombin Time: 13.6 seconds (ref 11.4–15.2)

## 2020-06-11 LAB — CBC WITH DIFFERENTIAL/PLATELET
Abs Immature Granulocytes: 0.03 10*3/uL (ref 0.00–0.07)
Basophils Absolute: 0 10*3/uL (ref 0.0–0.1)
Basophils Relative: 0 %
Eosinophils Absolute: 0 10*3/uL (ref 0.0–0.5)
Eosinophils Relative: 0 %
HCT: 41.7 % (ref 39.0–52.0)
Hemoglobin: 14.1 g/dL (ref 13.0–17.0)
Immature Granulocytes: 0 %
Lymphocytes Relative: 4 %
Lymphs Abs: 0.5 10*3/uL — ABNORMAL LOW (ref 0.7–4.0)
MCH: 32.7 pg (ref 26.0–34.0)
MCHC: 33.8 g/dL (ref 30.0–36.0)
MCV: 96.8 fL (ref 80.0–100.0)
Monocytes Absolute: 0.5 10*3/uL (ref 0.1–1.0)
Monocytes Relative: 5 %
Neutro Abs: 10.6 10*3/uL — ABNORMAL HIGH (ref 1.7–7.7)
Neutrophils Relative %: 91 %
Platelets: 204 10*3/uL (ref 150–400)
RBC: 4.31 MIL/uL (ref 4.22–5.81)
RDW: 12.1 % (ref 11.5–15.5)
WBC: 11.6 10*3/uL — ABNORMAL HIGH (ref 4.0–10.5)
nRBC: 0 % (ref 0.0–0.2)

## 2020-06-11 LAB — COMPREHENSIVE METABOLIC PANEL
ALT: 20 U/L (ref 0–44)
AST: 27 U/L (ref 15–41)
Albumin: 4.4 g/dL (ref 3.5–5.0)
Alkaline Phosphatase: 74 U/L (ref 38–126)
Anion gap: 10 (ref 5–15)
BUN: 23 mg/dL (ref 8–23)
CO2: 24 mmol/L (ref 22–32)
Calcium: 9.3 mg/dL (ref 8.9–10.3)
Chloride: 107 mmol/L (ref 98–111)
Creatinine, Ser: 1.34 mg/dL — ABNORMAL HIGH (ref 0.61–1.24)
GFR, Estimated: 51 mL/min — ABNORMAL LOW (ref >60)
Glucose, Bld: 114 mg/dL — ABNORMAL HIGH (ref 70–99)
Potassium: 4.1 mmol/L (ref 3.5–5.1)
Sodium: 141 mmol/L (ref 135–145)
Total Bilirubin: 2.1 mg/dL — ABNORMAL HIGH (ref 0.3–1.2)
Total Protein: 7.1 g/dL (ref 6.5–8.1)

## 2020-06-11 LAB — RESPIRATORY PANEL BY RT PCR (FLU A&B, COVID)
Influenza A by PCR: NEGATIVE
Influenza B by PCR: NEGATIVE
SARS Coronavirus 2 by RT PCR: NEGATIVE

## 2020-06-11 LAB — APTT: aPTT: 34 seconds (ref 24–36)

## 2020-06-11 LAB — LACTIC ACID, PLASMA
Lactic Acid, Venous: 2.1 mmol/L (ref 0.5–1.9)
Lactic Acid, Venous: 2.2 mmol/L (ref 0.5–1.9)
Lactic Acid, Venous: 1.3 mmol/L (ref 0.5–1.9)

## 2020-06-11 MED ORDER — SODIUM CHLORIDE (PF) 0.9 % IJ SOLN
INTRAMUSCULAR | Status: AC
  Administered 2020-06-11: 3 mL
  Filled 2020-06-11: qty 50

## 2020-06-11 MED ORDER — ENOXAPARIN SODIUM 40 MG/0.4ML ~~LOC~~ SOLN
40.0000 mg | SUBCUTANEOUS | Status: DC
  Administered 2020-06-11 – 2020-06-12 (×2): 40 mg via SUBCUTANEOUS
  Filled 2020-06-11 (×2): qty 0.4

## 2020-06-11 MED ORDER — SODIUM CHLORIDE 0.9 % IV SOLN
1.0000 g | Freq: Once | INTRAVENOUS | Status: AC
  Administered 2020-06-11: 1 g via INTRAVENOUS
  Filled 2020-06-11: qty 10

## 2020-06-11 MED ORDER — ACETAMINOPHEN 650 MG RE SUPP: 650.0000 mg | Freq: Four times a day (QID) | RECTAL | Status: DC | PRN

## 2020-06-11 MED ORDER — LACTATED RINGERS IV BOLUS (SEPSIS)
1000.0000 mL | Freq: Once | INTRAVENOUS | Status: AC
  Administered 2020-06-11: 1000 mL via INTRAVENOUS

## 2020-06-11 MED ORDER — IOHEXOL 300 MG/ML  SOLN
100.0000 mL | Freq: Once | INTRAMUSCULAR | Status: AC | PRN
  Administered 2020-06-11: 100 mL via INTRAVENOUS

## 2020-06-11 MED ORDER — ACETAMINOPHEN 325 MG PO TABS: 650.0000 mg | ORAL_TABLET | Freq: Four times a day (QID) | ORAL | Status: DC | PRN

## 2020-06-11 MED ORDER — DEXTROSE-NACL 5-0.9 % IV SOLN: INTRAVENOUS | Status: DC

## 2020-06-11 NOTE — Plan of Care (Signed)

## 2020-06-11 NOTE — ED Notes (Signed)
Date and time results received: 06/11/20 6:26 PM  (use smartphrase ".now" to insert current time)  Test: lactic acid Critical Value: 2.1  Name of Provider Notified: Army Melia, PA  Orders Received? Or Actions Taken?:

## 2020-06-11 NOTE — H&P (Signed)
History and Physical    Jacob Rich YTK:[id number] DOB: 12-06-1930 DOA: 06/11/2020  PCP: Renaye Rakers, MD  Patient coming from: Home.  History obtained from patient's wife.  Patient has advanced dementia.  Chief Complaint: Convulsions and nausea vomiting.  HPI: Jacob Rich is a 86 y.o. male with history of advanced dementia was brought to the ER the patient was having persistent nausea vomiting and early in the day and had abdominal with the patient's wife who provided the history this morning around 10 AM when patient walked and came back to his care facility patient an episode of nausea vomiting following which patient body shaking was called but patient refused to come to the.  Had another episode of nausea vomiting few hours later and had another episode at around 4 PM and at this time decided to bring patient to the ER patient did indicate elevated earlier in the day that he had some abdominal discomfort.  Did not have any diarrhea fever chills but after coming to the ER some productive cough.  ED Course: In the ER patient was not hypoxic but was having a temperature of around 99 F labs were showing lactic acid of WBC 11.6 creatinine total bilirubin of 2.1 Covid test was negative.  Chest x-ray showed atelectasis EKG shows sinus tachycardia patient was empirically placed on antibiotics since patient has a productive cough.  Patient is being admitted for intractable nausea vomiting and a convulsive episode.  UA is unremarkable.  Review of Systems: As per HPI, rest all negative.   Past Medical History:  Diagnosis Date  . Dementia (HCC)   . MRSA infection     Past Surgical History:  Procedure Laterality Date  . MRSa     2006/2007  . OTHER SURGICAL HISTORY     Dicverticulitis     reports that he has never smoked. He has never used smokeless tobacco. He reports previous alcohol use. He reports that he does not use drugs.  No Known Allergies  Family History  Problem Relation Age  of Onset  . Glaucoma Mother     Prior to Admission medications   Medication Sig Start Date End Date Taking? Authorizing Provider  acetaminophen (TYLENOL) 325 MG tablet Take 2 tablets (650 mg total) by mouth every 6 (six) hours as needed for mild pain (or Fever >/= 101). 01/06/20  Yes Lonia Blood, MD  Cyanocobalamin (VITAMIN B-12 PO) Take 1 tablet by mouth daily with lunch.   Yes [provider]  donepezil (ARICEPT) 10 MG tablet Take 1 tablet (10 mg total) by mouth at bedtime. Patient taking differently: Take 10 mg by mouth daily.  04/16/16  Yes Anson Fret, MD  folic acid (FOLVITE) 1 MG tablet Take 1 mg by mouth at bedtime.  08/18/19  Yes [provider]    Physical Exam: Constitutional: Moderately built and nourished. Vitals:   06/11/20 1930 06/11/20 2015 06/11/20 2045 06/11/20 2054  BP: 136/83 116/71 132/67   Pulse: (!) 107 96 87   Resp: 18 20 (!) 21   Temp:    98.6 F (37 C)  TempSrc:    Oral  SpO2: 97% 97% 97%    Eyes: Anicteric no pallor. ENMT: No discharge from the ears eyes nose or mouth. Neck: No mass felt.  No neck rigidity. Respiratory: No rhonchi or crepitations. Cardiovascular: S1-S2 heard. Abdomen: Soft nontender bowel sounds present. Musculoskeletal: No edema. Skin: No rash. Neurologic: Alert awake oriented to his name.  Moves all extremities. Psychiatric: Has  advanced dementia.   Labs on Admission: I have personally reviewed following labs and imaging studies  CBC: Recent Labs  Lab 06/11/20 1735  WBC 11.6*  NEUTROABS 10.6*  HGB 14.1  HCT 41.7  MCV 96.8  PLT 204   Basic Metabolic Panel: Recent Labs  Lab 06/11/20 1735  NA 141  K 4.1  CL 107  CO2 24  GLUCOSE 114*  BUN 23  CREATININE 1.34*  CALCIUM 9.3   GFR: CrCl cannot be calculated (Unknown ideal weight.). Liver Function Tests: Recent Labs  Lab 06/11/20 1735  AST 27  ALT 20  ALKPHOS 74  BILITOT 2.1*  PROT 7.1  ALBUMIN 4.4   No results for input(s):  LIPASE, AMYLASE in the last 168 hours. No results for input(s): AMMONIA in the last 168 hours. Coagulation Profile: Recent Labs  Lab 06/11/20 1735  INR 1.1   Cardiac Enzymes: No results for input(s): CKTOTAL, CKMB, CKMBINDEX, TROPONINI in the last 168 hours. BNP (last 3 results) No results for input(s): PROBNP in the last 8760 hours. HbA1C: No results for input(s): HGBA1C in the last 72 hours. CBG: No results for input(s): GLUCAP in the last 168 hours. Lipid Profile: No results for input(s): CHOL, HDL, LDLCALC, TRIG, CHOLHDL, LDLDIRECT in the last 72 hours. Thyroid Function Tests: No results for input(s): TSH, T4TOTAL, FREET4, T3FREE, THYROIDAB in the last 72 hours. Anemia Panel: No results for input(s): VITAMINB12, FOLATE, FERRITIN, TIBC, IRON, RETICCTPCT in the last 72 hours. Urine analysis:    Component Value Date/Time   COLORURINE YELLOW 06/11/2020 1703   APPEARANCEUR CLEAR 06/11/2020 1703   LABSPEC 1.020 06/11/2020 1703   PHURINE 6.0 06/11/2020 1703   GLUCOSEU NEGATIVE 06/11/2020 1703   HGBUR NEGATIVE 06/11/2020 1703   BILIRUBINUR NEGATIVE 06/11/2020 1703   KETONESUR NEGATIVE 06/11/2020 1703   PROTEINUR NEGATIVE 06/11/2020 1703   NITRITE NEGATIVE 06/11/2020 1703   LEUKOCYTESUR NEGATIVE 06/11/2020 1703   Sepsis Labs: @LABRCNTIP (procalcitonin:4,lacticidven:4) ) Recent Results (from the past 240 hour(s))  Respiratory Panel by RT PCR (Flu A&B, Covid) - Nasopharyngeal Swab     Status: None   Collection Time: 06/11/20  5:05 PM   Specimen: Nasopharyngeal Swab  Result Value Ref Range Status   SARS Coronavirus 2 by RT PCR NEGATIVE NEGATIVE Final    Comment: (NOTE) SARS-CoV-2 target nucleic acids are NOT DETECTED.  The SARS-CoV-2 RNA is generally detectable in upper respiratoy specimens during the acute phase of infection. The lowest concentration of SARS-CoV-2 viral copies this assay can detect is 131 copies/mL. A negative result does not preclude SARS-Cov-2 infection  and should not be used as the sole basis for treatment or other patient management decisions. A negative result may occur with  improper specimen collection/handling, submission of specimen other than nasopharyngeal swab, presence of viral mutation(s) within the areas targeted by this assay, and inadequate number of viral copies (<131 copies/mL). A negative result must be combined with clinical observations, patient history, and epidemiological information. The expected result is Negative.  Fact Sheet for Patients:  06/13/20  Fact Sheet for Healthcare Providers:  https://www.moore.com/  This test is no t yet approved or cleared by the https://www.young.biz/ FDA and  has been authorized for detection and/or diagnosis of SARS-CoV-2 by FDA under an Emergency Use Authorization (EUA). This EUA will remain  in effect (meaning this test can be used) for the duration of the COVID-19 declaration under Section 564(b)(1) of the Act, 21 U.S.C. section 360bbb-3(b)(1), unless the authorization is terminated or revoked sooner.  Influenza A by PCR NEGATIVE NEGATIVE Final   Influenza B by PCR NEGATIVE NEGATIVE Final    Comment: (NOTE) The Xpert Xpress SARS-CoV-2/FLU/RSV assay is intended as an aid in  the diagnosis of influenza from Nasopharyngeal swab specimens and  should not be used as a sole basis for treatment. Nasal washings and  aspirates are unacceptable for Xpert Xpress SARS-CoV-2/FLU/RSV  testing.  Fact Sheet for Patients: https://www.moore.com/  Fact Sheet for Healthcare Providers: https://www.young.biz/  This test is not yet approved or cleared by the [place] FDA and  has been authorized for detection and/or diagnosis of SARS-CoV-2 by  FDA under an Emergency Use Authorization (EUA). This EUA will remain  in effect (meaning this test can be used) for the duration of the  Covid-19 declaration  under Section 564(b)(1) of the Act, 21  U.S.C. section 360bbb-3(b)(1), unless the authorization is  terminated or revoked. Performed at [facility], [address] [place], [place] [address]      Radiological Exams on Admission: CT Head Wo Contrast  Result Date: 06/11/2020 CLINICAL DATA:  Fever and vomiting. EXAM: CT HEAD WITHOUT CONTRAST TECHNIQUE: Contiguous axial images were obtained from the base of the skull through the vertex without intravenous contrast. COMPARISON:  September 08, 2019 FINDINGS: Brain: There is mild cerebral atrophy with widening of the extra-axial spaces and ventricular dilatation. There are areas of decreased attenuation within the white matter tracts of the supratentorial brain, consistent with microvascular disease changes. Vascular: No hyperdense vessel or unexpected calcification. Skull: Normal. Negative for fracture or focal lesion. Sinuses/Orbits: No acute finding. Other: Mild scalp soft tissue swelling is seen along the vertex on the right. IMPRESSION: Generalized cerebral atrophy, without evidence of acute intracranial abnormality. Electronically Signed   By: Aram Candela M.D.   On: 06/11/2020 19:58   DG Chest Port 1 View  Result Date: 06/11/2020 CLINICAL DATA:  Vomiting. EXAM: PORTABLE CHEST 1 VIEW COMPARISON:  Jan 04, 2020 FINDINGS: Mild, stable hazy areas of atelectasis are seen within the bilateral lung bases, left greater than right. There is no evidence of a pleural effusion or pneumothorax. The cardiac silhouette is mildly enlarged and unchanged in size. Multilevel degenerative changes seen throughout the thoracic spine. IMPRESSION: Mild, stable bibasilar atelectasis, left greater than right. Electronically Signed   By: Aram Candela M.D.   On: 06/11/2020 17:26    EKG: Independently reviewed.  Sinus tachycardia.  Assessment/Plan Principal Problem:   Acute encephalopathy Active Problems:   Dementia with behavioral  disturbance (HCC)   Nausea & vomiting    1. Intractable nausea vomiting cause of the abdomen appears benign probably remains elevated but AST ALT normal.  Will get CT abdomen pelvis.  Based on which we will have further plans. 2. Convulsive episode will get MRI brain and EEG.  Closely monitor. 3. History of dementia on Aricept. 4. Productive cough and aspiration from vomiting.  Antibiotics for now.  Check procalcitonin follow lactic acid levels   DVT prophylaxis: Lovenox. Code Status: DNR as confirmed with patient's wife. Family Communication: Patient's wife. Disposition Plan: Home. Consults called: None. Admission status: Observation.   Eduard Clos MD [facility] Pager [phone].  If 7PM-7AM, please contact night-coverage [email or web address] Password [email or web address]  06/11/2020, 9:09 PM

## 2020-06-11 NOTE — ED Provider Notes (Signed)
[facility] DEPT Provider Note   CSN: [id number] Arrival date & time: 06/11/20  1635     History Chief Complaint  Patient presents with  . Emesis    Jacob Rich is a 86 y.o. male.  86 year old male brought in by EMS from home, history of dementia. Initial history is limited to report from EMS as patient contributes nothing to his history at this time. Reports of vomiting onset 11:30AM today and fever. EMS called out earlier today, reported patient was more confused on their second visit.         Past Medical History:  Diagnosis Date  . Dementia ([place])   . MRSA infection     Patient Active Problem List   Diagnosis Date Noted  . Acute encephalopathy 06/11/2020  . Nausea & vomiting 06/11/2020  . Complicated UTI (urinary tract infection) 01/05/2020  . Dementia ([place]) 01/05/2020  . UTI (urinary tract infection) 06/17/2017  . Traumatic closed displaced fracture of shoulder with anterior dislocation with routine healing 06/10/2016  . Dementia with behavioral disturbance ([place]) 04/16/2016    Past Surgical History:  Procedure Laterality Date  . MRSa     2006/2007  . OTHER SURGICAL HISTORY     Dicverticulitis       Family History  Problem Relation Age of Onset  . Glaucoma Mother     Social History   Tobacco Use  . Smoking status: Never Smoker  . Smokeless tobacco: Never Used  Substance Use Topics  . Alcohol use: Not Currently    Comment: rare  . Drug use: No    Home Medications Prior to Admission medications   Medication Sig Start Date End Date Taking? Authorizing Provider  acetaminophen (TYLENOL) 325 MG tablet Take 2 tablets (650 mg total) by mouth every 6 (six) hours as needed for mild pain (or Fever >/= 101). 01/06/20  Yes Cherene Altes, MD  Cyanocobalamin (VITAMIN B-12 PO) Take 1 tablet by mouth daily with lunch.   Yes [provider]  donepezil (ARICEPT) 10 MG tablet Take 1 tablet (10 mg total) by mouth at  bedtime. Patient taking differently: Take 10 mg by mouth daily.  04/16/16  Yes Melvenia Beam, MD  folic acid (FOLVITE) 1 MG tablet Take 1 mg by mouth at bedtime.  08/18/19  Yes [provider]    Allergies    Patient has no known allergies.  Review of Systems   Review of Systems  Unable to perform ROS: Dementia    Physical Exam Updated Vital Signs BP 116/71   Pulse 96   Temp 99.3 F (37.4 C) (Rectal)   Resp 20   SpO2 97%   Physical Exam Vitals and nursing note reviewed.  Constitutional:      General: Jacob Rich is not in acute distress.    Appearance: Jacob Rich is not diaphoretic.  HENT:     Head: Normocephalic and atraumatic.     Mouth/Throat:     Mouth: Mucous membranes are dry.  Eyes:     Conjunctiva/sclera: Conjunctivae normal.  Cardiovascular:     Rate and Rhythm: Regular rhythm. Tachycardia present.     Heart sounds: Normal heart sounds.  Pulmonary:     Effort: Tachypnea present.     Breath sounds: Normal breath sounds. No wheezing.  Musculoskeletal:     Cervical back: Neck supple.     Right lower leg: No edema.     Left lower leg: No edema.  Skin:    General: Skin  is warm and dry.     Findings: No erythema or rash.  Neurological:     Mental Status: Jacob Rich is alert. Jacob Rich is disoriented.     Comments: Disoriented, states year is 5, currently in [place]. Is alert to name and DOB. Follows simple commands.   Psychiatric:        Behavior: Behavior normal.     ED Results / Procedures / Treatments   Labs (all labs ordered are listed, but only abnormal results are displayed) Labs Reviewed  LACTIC ACID, PLASMA - Abnormal; Notable for the following components:      Result Value   Lactic Acid, Venous 2.1 (*)    All other components within normal limits  COMPREHENSIVE METABOLIC PANEL - Abnormal; Notable for the following components:   Glucose, Bld 114 (*)    Creatinine, Ser 1.34 (*)    Total Bilirubin 2.1 (*)    GFR, Estimated 51 (*)    All other components  within normal limits  CBC WITH DIFFERENTIAL/PLATELET - Abnormal; Notable for the following components:   WBC 11.6 (*)    Neutro Abs 10.6 (*)    Lymphs Abs 0.5 (*)    All other components within normal limits  RESPIRATORY PANEL BY RT PCR (FLU A&B, COVID)  CULTURE, BLOOD (SINGLE)  URINE CULTURE  CULTURE, BLOOD (SINGLE)  PROTIME-INR  APTT  URINALYSIS, ROUTINE W REFLEX MICROSCOPIC  LACTIC ACID, PLASMA    EKG EKG Interpretation  Date/Time:  Sunday June 11 2020 17:11:54 EDT Ventricular Rate:  124 PR Interval:    QRS Duration: 90 QT Interval:  332 QTC Calculation: 477 R Axis:   31 Text Interpretation: Sinus or ectopic atrial tachycardia Abnormal R-wave progression, early transition Borderline prolonged QT interval tachycardia new from previous Confirmed by Theotis Burrow [phone]) on 06/11/2020 6:42:35 PM   Radiology CT Head Wo Contrast  Result Date: 06/11/2020 CLINICAL DATA:  Fever and vomiting. EXAM: CT HEAD WITHOUT CONTRAST TECHNIQUE: Contiguous axial images were obtained from the base of the skull through the vertex without intravenous contrast. COMPARISON:  September 08, 2019 FINDINGS: Brain: There is mild cerebral atrophy with widening of the extra-axial spaces and ventricular dilatation. There are areas of decreased attenuation within the white matter tracts of the supratentorial brain, consistent with microvascular disease changes. Vascular: No hyperdense vessel or unexpected calcification. Skull: Normal. Negative for fracture or focal lesion. Sinuses/Orbits: No acute finding. Other: Mild scalp soft tissue swelling is seen along the vertex on the right. IMPRESSION: Generalized cerebral atrophy, without evidence of acute intracranial abnormality. Electronically Signed   By: Virgina Norfolk M.D.   On: 06/11/2020 19:58   DG Chest Port 1 View  Result Date: 06/11/2020 CLINICAL DATA:  Vomiting. EXAM: PORTABLE CHEST 1 VIEW COMPARISON:  Jan 04, 2020 FINDINGS: Mild, stable hazy areas  of atelectasis are seen within the bilateral lung bases, left greater than right. There is no evidence of a pleural effusion or pneumothorax. The cardiac silhouette is mildly enlarged and unchanged in size. Multilevel degenerative changes seen throughout the thoracic spine. IMPRESSION: Mild, stable bibasilar atelectasis, left greater than right. Electronically Signed   By: Virgina Norfolk M.D.   On: 06/11/2020 17:26    Procedures .Critical Care Performed by: Tacy Learn, PA-C Authorized by: Tacy Learn, PA-C   Critical care provider statement:    Critical care time (minutes):  45   Critical care was time spent personally by me on the following activities:  Discussions with consultants, evaluation of patient's response  to treatment, examination of patient, ordering and performing treatments and interventions, ordering and review of laboratory studies, ordering and review of radiographic studies, pulse oximetry, re-evaluation of patient's condition, obtaining history from patient or surrogate and review of old charts   (including critical care time)  Medications Ordered in ED Medications  lactated ringers bolus 1,000 mL (0 mLs Intravenous Stopped 06/11/20 1909)  cefTRIAXone (ROCEPHIN) 1 g in sodium chloride 0.9 % 100 mL IVPB (1 g Intravenous New Bag/Given 06/11/20 1909)    ED Course  I have reviewed the triage vital signs and the nursing notes.  Pertinent labs & imaging results that were available during my care of the patient were reviewed by me and considered in my medical decision making (see chart for details).  Clinical Course as of Jun 11 2038  Nancy Fetter Jun 12, 5971  8559 86 year old male brought in by EMS from home, history of dementia, history is provided initially by EMS who reports fever at home today with vomiting. Patient's wife is now at bedside, states that Jacob Rich ate breakfast this morning at 1030, walked outside, came back inside and sat on the sofa where she thinks Jacob Rich may  have had a seizure.  States patient has never had a seizure before, states his whole body was shaking for several minutes, she called 911.  After the episode, patient had 1 episode of vomiting.  No report of loss of bladder function, unsure of postictal state.  Patient was assessed by EMS and Jacob Rich refused transport to the ER at that time.  EMS was called out 2 more times before patient was finally taken to the hospital. Wife denies fever history recently, states that Jacob Rich fell about 10 days ago and hit his head, had a small laceration which was treated with a single staple. Wife reports patient appears flushed and sleeping more than usual also new onset cough today.   [LM]  1922 Chest x-ray without change from prior, CT head without acute findings.  CBC with mild acute cytosis with white count 11.6 with increased neutrophils.  CMP with slight increase in total bilirubin however LFTs and alk phos within normal notes.  Urinalysis without significant findings, Covid and flu test negative.  Lactic acid elevated x1 at 2.1. Does have improved after 1 L lactated Ringer's. Patient was given Rocephin for possible infectious causes, had considered UTI however urinalysis is reassuring and chest x-ray without obvious pneumonia. Discussed with Dr. Hal Hope with [facility] hospitalist service will consult for admission.   [LM]    Clinical Course User Index [LM] Roque Lias   MDM Rules/Calculators/A&P                          Final Clinical Impression(s) / ED Diagnoses Final diagnoses:  Altered mental status, unspecified altered mental status type  Elevated lactic acid level    Rx / DC Orders ED Discharge Orders    None       Roque Lias 06/11/20 2039    Little, Wenda Overland, MD 06/14/20 1530
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
## 2020-06-11 NOTE — ED Triage Notes (Signed)
Pt arrived via EMS from home. Pt has been febrile and vomiting since 11:30 this morning. Per EMS pt is combative when they attempted to start an IV on him the second time that EMS were called out. Pt is more lethargic now per EMS. Pt had O2 reading of 90 on RA. PT has hx of dementia.

## 2020-06-12 ENCOUNTER — Inpatient Hospital Stay (HOSPITAL_COMMUNITY)
Admit: 2020-06-12 | Discharge: 2020-06-12 | Disposition: A | Discharge status: Home / Self Care | Payer: Medicare Other | Attending: Internal Medicine | Admitting: Internal Medicine

## 2020-06-12 ENCOUNTER — Inpatient Hospital Stay (HOSPITAL_COMMUNITY): Payer: Medicare Other

## 2020-06-12 DIAGNOSIS — R569 Unspecified convulsions: Secondary | ICD-10-CM | POA: Diagnosis present

## 2020-06-12 DIAGNOSIS — G9340 Encephalopathy, unspecified: Secondary | ICD-10-CM | POA: Diagnosis present

## 2020-06-12 DIAGNOSIS — G309 Alzheimer's disease, unspecified: Secondary | ICD-10-CM

## 2020-06-12 DIAGNOSIS — R112 Nausea with vomiting, unspecified: Secondary | ICD-10-CM | POA: Diagnosis not present

## 2020-06-12 DIAGNOSIS — F0281 Dementia in other diseases classified elsewhere with behavioral disturbance: Secondary | ICD-10-CM | POA: Diagnosis not present

## 2020-06-12 DIAGNOSIS — J209 Acute bronchitis, unspecified: Secondary | ICD-10-CM | POA: Diagnosis present

## 2020-06-12 DIAGNOSIS — Z66 Do not resuscitate: Secondary | ICD-10-CM | POA: Diagnosis present

## 2020-06-12 DIAGNOSIS — Z79899 Other long term (current) drug therapy: Secondary | ICD-10-CM | POA: Diagnosis not present

## 2020-06-12 DIAGNOSIS — K2970 Gastritis, unspecified, without bleeding: Secondary | ICD-10-CM | POA: Diagnosis present

## 2020-06-12 DIAGNOSIS — Z87440 Personal history of urinary (tract) infections: Secondary | ICD-10-CM | POA: Diagnosis not present

## 2020-06-12 DIAGNOSIS — K8020 Calculus of gallbladder without cholecystitis without obstruction: Secondary | ICD-10-CM | POA: Diagnosis present

## 2020-06-12 DIAGNOSIS — Z20822 Contact with and (suspected) exposure to covid-19: Secondary | ICD-10-CM | POA: Diagnosis present

## 2020-06-12 DIAGNOSIS — F0391 Unspecified dementia with behavioral disturbance: Secondary | ICD-10-CM | POA: Diagnosis present

## 2020-06-12 DIAGNOSIS — G9341 Metabolic encephalopathy: Secondary | ICD-10-CM | POA: Diagnosis present

## 2020-06-12 LAB — CBC WITH DIFFERENTIAL/PLATELET
Abs Immature Granulocytes: 0.18 10*3/uL — ABNORMAL HIGH (ref 0.00–0.07)
Basophils Absolute: 0 10*3/uL (ref 0.0–0.1)
Basophils Relative: 0 %
Eosinophils Absolute: 0 10*3/uL (ref 0.0–0.5)
Eosinophils Relative: 0 %
HCT: 34.4 % — ABNORMAL LOW (ref 39.0–52.0)
Hemoglobin: 12 g/dL — ABNORMAL LOW (ref 13.0–17.0)
Immature Granulocytes: 1 %
Lymphocytes Relative: 5 %
Lymphs Abs: 0.6 10*3/uL — ABNORMAL LOW (ref 0.7–4.0)
MCH: 33.1 pg (ref 26.0–34.0)
MCHC: 34.9 g/dL (ref 30.0–36.0)
MCV: 95 fL (ref 80.0–100.0)
Monocytes Absolute: 0.5 10*3/uL (ref 0.1–1.0)
Monocytes Relative: 4 %
Neutro Abs: 12.4 10*3/uL — ABNORMAL HIGH (ref 1.7–7.7)
Neutrophils Relative %: 90 %
Platelets: 109 10*3/uL — ABNORMAL LOW (ref 150–400)
RBC: 3.62 MIL/uL — ABNORMAL LOW (ref 4.22–5.81)
RDW: 12.2 % (ref 11.5–15.5)
WBC: 13.8 10*3/uL — ABNORMAL HIGH (ref 4.0–10.5)
nRBC: 0 % (ref 0.0–0.2)

## 2020-06-12 LAB — HEPATIC FUNCTION PANEL
ALT: 23 U/L (ref 0–44)
AST: 25 U/L (ref 15–41)
Albumin: 3.5 g/dL (ref 3.5–5.0)
Alkaline Phosphatase: 56 U/L (ref 38–126)
Bilirubin, Direct: 0.3 mg/dL — ABNORMAL HIGH (ref 0.0–0.2)
Indirect Bilirubin: 1.4 mg/dL — ABNORMAL HIGH (ref 0.3–0.9)
Total Bilirubin: 1.7 mg/dL — ABNORMAL HIGH (ref 0.3–1.2)
Total Protein: 5.8 g/dL — ABNORMAL LOW (ref 6.5–8.1)

## 2020-06-12 LAB — LACTIC ACID, PLASMA: Lactic Acid, Venous: 1.2 mmol/L (ref 0.5–1.9)

## 2020-06-12 LAB — GLUCOSE, CAPILLARY: Glucose-Capillary: 106 mg/dL — ABNORMAL HIGH (ref 70–99)

## 2020-06-12 MED ORDER — HALOPERIDOL LACTATE 5 MG/ML IJ SOLN
2.0000 mg | Freq: Once | INTRAMUSCULAR | Status: AC
  Administered 2020-06-13: 2 mg via INTRAVENOUS
  Filled 2020-06-12: qty 1

## 2020-06-12 MED ORDER — SODIUM CHLORIDE 0.9 % IV SOLN
500.0000 mg | INTRAVENOUS | Status: DC
  Administered 2020-06-12: 500 mg via INTRAVENOUS
  Filled 2020-06-12: qty 500

## 2020-06-12 MED ORDER — TAB-A-VITE/IRON PO TABS
1.0000 | ORAL_TABLET | Freq: Every day | ORAL | Status: DC
  Administered 2020-06-13: 1 via ORAL
  Filled 2020-06-12 (×2): qty 1

## 2020-06-12 MED ORDER — DEXTROSE IN LACTATED RINGERS 5 % IV SOLN: INTRAVENOUS | Status: DC

## 2020-06-12 MED ORDER — DONEPEZIL HCL 10 MG PO TABS
10.0000 mg | ORAL_TABLET | Freq: Every day | ORAL | Status: DC
  Administered 2020-06-12: 10 mg via ORAL
  Filled 2020-06-12: qty 1

## 2020-06-12 MED ORDER — THIAMINE HCL 100 MG PO TABS
100.0000 mg | ORAL_TABLET | Freq: Every day | ORAL | Status: DC
  Administered 2020-06-12 – 2020-06-13 (×2): 100 mg via ORAL
  Filled 2020-06-12 (×2): qty 1

## 2020-06-12 MED ORDER — SODIUM CHLORIDE 0.9 % IV SOLN
2.0000 g | INTRAVENOUS | Status: DC
  Filled 2020-06-12: qty 20

## 2020-06-12 MED ORDER — GUAIFENESIN-DM 100-10 MG/5ML PO SYRP: 5.0000 mL | ORAL_SOLUTION | ORAL | Status: DC | PRN

## 2020-06-12 MED ORDER — PANTOPRAZOLE SODIUM 40 MG PO TBEC
40.0000 mg | DELAYED_RELEASE_TABLET | Freq: Every day | ORAL | Status: DC
  Administered 2020-06-12 – 2020-06-13 (×2): 40 mg via ORAL
  Filled 2020-06-12 (×2): qty 1

## 2020-06-12 MED ORDER — IPRATROPIUM-ALBUTEROL 20-100 MCG/ACT IN AERS
1.0000 | INHALATION_SPRAY | Freq: Four times a day (QID) | RESPIRATORY_TRACT | Status: DC | PRN
  Filled 2020-06-12: qty 4
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
## 2020-06-12 NOTE — Evaluation (Signed)
Occupational Therapy Evaluation Patient Details Name: Jacob Rich MRN: [id number] DOB: [birth year] Today's Date: 06/12/2020    History of Present Illness Issac Moure is a 86 y.o. male with history of advanced dementia was brought to the ER the patient was having persistent nausea vomiting and early in the day and had abdominal, suspected seizure, worsening cognition, likely due to metabolic encephalopathy   Clinical Impression   This 86 yo male admitted with above presents to acute OT with PLOF per wife of being at an overall S level due to his dementia but he was able to furniture/wall walk in their mobile home, could shower himself and dress himself once she set his clothes out. He currently is setup/S-min A for basic ADLs and associated mobility. He will benefit from acute OT with follow up HHOT.    Follow Up Recommendations  Home health OT;Supervision/Assistance - 24 hour    Equipment Recommendations  None recommended by OT       Precautions / Restrictions Precautions Precautions: Fall Restrictions Weight Bearing Restrictions: No      Mobility Bed Mobility Overal bed mobility: Needs Assistance Bed Mobility: Supine to Sit     Supine to sit: HOB elevated;Min assist     General bed mobility comments: use of rail, A at trunk    Transfers Overall transfer level: Needs assistance Equipment used: 1 person hand held assist Transfers: Sit to/from Stand;Stand Pivot Transfers Sit to Stand: Min assist Stand pivot transfers: Min assist            Balance Overall balance assessment: Needs assistance Sitting-balance support: No upper extremity supported;Feet supported Sitting balance-Leahy Scale: Good     Standing balance support: Single extremity supported Standing balance-Leahy Scale: Poor                             ADL either performed or assessed with clinical judgement   ADL Overall ADL's : Needs assistance/impaired Eating/Feeding:  Independent;Sitting Eating/Feeding Details (indicate cue type and reason): EOB Grooming: Set up;Supervision/safety;Sitting Grooming Details (indicate cue type and reason): EOB Upper Body Bathing: Supervision/ safety;Set up;Sitting Upper Body Bathing Details (indicate cue type and reason): EOB Lower Body Bathing: Minimal assistance;Sit to/from stand   Upper Body Dressing : Set up;Supervision/safety;Sitting   Lower Body Dressing: Minimal assistance;Sit to/from stand   Toilet Transfer: Minimal assistance;Stand-pivot;BSC   Toileting- Clothing Manipulation and Hygiene: Minimal assistance;Sit to/from stand               Vision Patient Visual Report: No change from baseline              Pertinent Vitals/Pain Pain Assessment: No/denies pain     Hand Dominance Right   Extremity/Trunk Assessment Upper Extremity Assessment Upper Extremity Assessment: Overall WFL for tasks assessed           Communication Communication Communication: No difficulties   Cognition Arousal/Alertness: Awake/alert Behavior During Therapy: WFL for tasks assessed/performed Overall Cognitive Status: History of cognitive impairments - at baseline                                       Home Living Family/patient expects to be discharged to:: Private residence Living Arrangements: Spouse/significant other Available Help at Discharge: Family;Available 24 hours/day Type of Home: Mobile home Home Access: Stairs to enter Entrance Stairs-Number of Steps: 5 Entrance Stairs-Rails: Left Home Layout: One level  Bathroom Shower/Tub: IT trainer: Standard     Home Equipment: Grab bars - toilet;Tub bench   Additional Comments: All information via wife over phone      Prior Functioning/Environment Level of Independence: Independent (but needs S due to dementia)        Comments: Wife reports that when he has has PT in the past they have not  recommended any sort of AD for ambulation due to him not be coordinated enough to use.        OT Problem List: Impaired balance (sitting and/or standing);Decreased cognition;Decreased safety awareness      OT Treatment/Interventions: Self-care/ADL training;DME and/or AE instruction;Patient/family education;Balance training    OT Goals(Current goals can be found in the care plan section) Acute Rehab OT Goals Patient Stated Goal: to eat lunch (it arrived while I was with him) OT Goal Formulation: With family Time For Goal Achievement: 06/26/20 Potential to Achieve Goals: Good  OT Frequency: Min 2X/week              AM-PAC OT "6 Clicks" Daily Activity     Outcome Measure Help from another person eating meals?: None Help from another person taking care of personal grooming?: A Little Help from another person toileting, which includes using toliet, bedpan, or urinal?: A Little Help from another person bathing (including washing, rinsing, drying)?: A Little Help from another person to put on and taking off regular upper body clothing?: A Little Help from another person to put on and taking off regular lower body clothing?: A Little 6 Click Score: 19   End of Session    Activity Tolerance: Patient tolerated treatment well Patient left: in bed;with call bell/phone within reach;with bed alarm set  OT Visit Diagnosis: Unsteadiness on feet (R26.81);Other abnormalities of gait and mobility (R26.89);Muscle weakness (generalized) (M62.81);Other symptoms and signs involving cognitive function                Time: 1443-1506 OT Time Calculation (min): 23 min Charges:  OT General Charges $OT Visit: 1 Visit OT Evaluation $OT Eval Moderate Complexity: 1 Mod OT Treatments $Self Care/Home Management : 8-22 mins  Ignacia Palma, OTR/L Acute [employer] Pager [phone] Office [phone]     Evette Georges 06/12/2020, 4:31 PM

## 2020-06-12 NOTE — Progress Notes (Signed)
EEG complete - results pending 

## 2020-06-12 NOTE — Progress Notes (Signed)
PROGRESS NOTE    Jacob Rich  KCM:[id number] DOB: [birth year] DOA: 06/11/2020 PCP: Renaye Rakers, MD    Brief Narrative:  Jacob Rich was admitted to the hospital with working diagnosis of intractable nausea and vomiting.  86 year old male with past medical history of advanced dementia, he presented with persistent nausea, vomiting and abdominal pain.  Patient unable to tolerate p.o.  On his initial physical examination blood pressure 136/83, heart rate 107, respiratory rate 18, temperature 98.6, oxygen saturation 97%, lungs with no rhonchi or rales, heart S1-S2, present rhythmic, abdomen soft nontender, no lower extremity edema. Sodium 141, potassium 4.1, chloride 107, bicarb 24, glucose 114, BUN 23, creatinine 1.34, white count 11.6, hemoglobin 14.1, hematocrit 41.7, platelets 204. AST 27. ALT 20, T bil 2,1.  SARS COVID-19 negative.  Urinalysis specific gravity 1.020.  Negative white cells.  Head CT with generalized atrophy no evidence of acute changes.  CT abdomen with cholelithiasis without acute cholecystitis.  Chronic lateral eighth and ninth right rib fractures.  Chest radiograph with left base atelectasis.  EKG 124 bpm, normal axis, normal intervals, sinus rhythm, no ST segment or T wave changes.  Assessment & Plan:   Principal Problem:   Nausea & vomiting Active Problems:   Dementia with behavioral disturbance (HCC)   Acute encephalopathy   Convulsion ([facility])   1. Intractable nausea and vomiting. This am patient not yet back to baseline, will slowly advance diet to soft. Continue with supportive medical therapy with IV fluids, antiacids and as needed antiemetics.   Out of bed to chair as tolerated.  Bilirubin is trending down and liver enzymes are stable. Follow up on HIDA scan, patient has cholelithiasis but not cholecystitis per CT abdomen.   2. Suspected seizure. Head CT with atrophy but not acute changes. Further work up with brain MRI and EEG.  Today he is awake and alert,  clinically non focal.   3. Dementia with metabolic encephalopathy. Worsening cognition, likely due to metabolic encephalopathy. Continue hydration with balanced electrolyte solutions and dextrose. Add thiamine and vitamins.  Continue with neuro checks q 4 H, fall precautions, PT and OT evaluation.   Resume donepezil.   4. Bronchitis. Continue oxymetry monitoring, as needed bronchodilator therapy and antitussive agents.  Hold on antibiotic therapy for now, chest film with atelectasis but not frank pneumonic infiltrate.    Patient continue to be at high risk for worsening encephalopathy   Status is: Observation  The patient will require care spanning > 2 midnights and should be moved to inpatient because: IV treatments appropriate due to intensity of illness or inability to take PO  Dispo: The patient is from: Home              Anticipated d/c is to: Home              Anticipated d/c date is: 2 days              Patient currently is not medically stable to d/c.   DVT prophylaxis: Enoxaparin   Code Status:   DNR   Family Communication:  Not able to reach his family over the phone    Subjective: Patient is confused and disorientated, not agitated, no chest pain or dyspnea. Has been on clear liquid diet.   Objective: Vitals:   06/12/20 0104 06/12/20 0534 06/12/20 0911 06/12/20 1320  BP: 132/65 (!) 120/59 (!) 115/55 114/61  Pulse: 75 68 70 64  Resp: 18 18  19   Temp:  99.4 F (37.4 C)  99 F (37.2 C) 98.4 F (36.9 C)  TempSrc:  Oral Oral Oral  SpO2:  98% 100% 98%  Weight:      Height:        Intake/Output Summary (Last 24 hours) at 06/12/2020 1344 Last data filed at 06/12/2020 1148 Gross per 24 hour  Intake 906.25 ml  Output 700 ml  Net 206.25 ml   Filed Weights   06/11/20 2118  Weight: 76.3 kg    Examination:   General: deconditioned and ill looking appearing  Neurology: Awake and alert, non focal  E ENT: mild pallor, no icterus, oral mucosa  dry Cardiovascular: No JVD. S1-S2 present, rhythmic, no gallops, rubs, or murmurs. No lower extremity edema. Pulmonary: positive breath sounds bilaterally, with no wheezing, rhonchi or rales. Gastrointestinal. Abdomen soft and non tender Skin. No rashes Musculoskeletal: no joint deformities     Data Reviewed: I have personally reviewed following labs and imaging studies  CBC: Recent Labs  Lab 06/11/20 1735 06/12/20 0008  WBC 11.6* 13.8*  NEUTROABS 10.6* 12.4*  HGB 14.1 12.0*  HCT 41.7 34.4*  MCV 96.8 95.0  PLT 204 109*   Basic Metabolic Panel: Recent Labs  Lab 06/11/20 1735  NA 141  K 4.1  CL 107  CO2 24  GLUCOSE 114*  BUN 23  CREATININE 1.34*  CALCIUM 9.3   GFR: Estimated Creatinine Clearance: 41.9 mL/min (A) (by C-G formula based on SCr of 1.34 mg/dL (H)). Liver Function Tests: Recent Labs  Lab 06/11/20 1735 06/12/20 0008  AST 27 25  ALT 20 23  ALKPHOS 74 56  BILITOT 2.1* 1.7*  PROT 7.1 5.8*  ALBUMIN 4.4 3.5   No results for input(s): LIPASE, AMYLASE in the last 168 hours. No results for input(s): AMMONIA in the last 168 hours. Coagulation Profile: Recent Labs  Lab 06/11/20 1735  INR 1.1   Cardiac Enzymes: No results for input(s): CKTOTAL, CKMB, CKMBINDEX, TROPONINI in the last 168 hours. BNP (last 3 results) No results for input(s): PROBNP in the last 8760 hours. HbA1C: No results for input(s): HGBA1C in the last 72 hours. CBG: Recent Labs  Lab 06/12/20 1146  GLUCAP 106*   Lipid Profile: No results for input(s): CHOL, HDL, LDLCALC, TRIG, CHOLHDL, LDLDIRECT in the last 72 hours. Thyroid Function Tests: No results for input(s): TSH, T4TOTAL, FREET4, T3FREE, THYROIDAB in the last 72 hours. Anemia Panel: No results for input(s): VITAMINB12, FOLATE, FERRITIN, TIBC, IRON, RETICCTPCT in the last 72 hours.    Radiology Studies: I have reviewed all of the imaging during this hospital visit personally     Scheduled Meds: . enoxaparin  (LOVENOX) injection  40 mg Subcutaneous Q24H   Continuous Infusions: . azithromycin 500 mg (06/12/20 0529)  . cefTRIAXone (ROCEPHIN)  IV    . dextrose 5 % and 0.9% NaCl 75 mL/hr at 06/11/20 2142     LOS: 0 days        Jacob Rich Jacob Gula, MD

## 2020-06-12 NOTE — Progress Notes (Signed)
MRI machine has been down today. The machine is  back up now. tech can to get pt he is agitated. Was threatening to hit the nurse tech  Page in to MD. He states to hold the MRI for today and he will reassess tomorrow.

## 2020-06-13 DIAGNOSIS — R569 Unspecified convulsions: Secondary | ICD-10-CM

## 2020-06-13 DIAGNOSIS — G9340 Encephalopathy, unspecified: Secondary | ICD-10-CM

## 2020-06-13 LAB — URINE CULTURE

## 2020-06-13 LAB — GLUCOSE, CAPILLARY: Glucose-Capillary: 93 mg/dL (ref 70–99)

## 2020-06-13 MED ORDER — TAB-A-VITE/IRON PO TABS: 1.0000 | ORAL_TABLET | Freq: Every day | ORAL | 0 refills | Status: AC

## 2020-06-13 MED ORDER — PANTOPRAZOLE SODIUM 40 MG PO TBEC: 40.0000 mg | DELAYED_RELEASE_TABLET | Freq: Every day | ORAL | 0 refills | Status: AC

## 2020-06-13 NOTE — Progress Notes (Signed)
Initial Nutrition Assessment  DOCUMENTATION CODES:   Not applicable  INTERVENTION:  - will order oral nutrition supplements if patient unable to d/c today.   NUTRITION DIAGNOSIS:   Inadequate oral intake related to acute illness, nausea, vomiting as evidenced by per patient/family report.  GOAL:   Patient will meet greater than or equal to 90% of their needs  MONITOR:   PO intake, Labs, Weight trends, I & O's  REASON FOR ASSESSMENT:   Consult Assessment of nutrition requirement/status  ASSESSMENT:   86 year old male with medical history of advanced dementia, chronic lateral 8th and 9th rib fractures. He presented to the ED due to abdominal pain and N/V with inability to tolerate PO.  Diet advanced from NPO to CLD on 10/24 at 2110 and to Regular today at 0943. No intakes documented. RN does not think that patient has had anything to eat today, has not seen a meal tray in his room. RN states that patient has been refusing things most of the day.   Patient noted to be a/o to self only. No family/visitors present at this time. Patient in the chair, scrunched down, sleeping, and bedside table in front of him. Unable to complete NFPE at this time.   Discharge order entered ~1 hour ago. Discharge summary has not yet been entered.   Weight on 10/24 was 168 lb and PTA the most recently documented weight was on 01/05/20 when he weighed 169 lb.    Labs reviewed; CBGs: 106 and 93 mg/dl, creatinine: 8.54 mg/dl, GFR: 51 ml/min. Medications reviewed; 1 tablet multivitamin with minerals/day, 100 mg oral thiamine/day.     NUTRITION - FOCUSED PHYSICAL EXAM:  unable to complete at this time.   Diet Order:   Diet Order            Diet regular Room service appropriate? Yes; Fluid consistency: Thin  Diet effective now                 EDUCATION NEEDS:   No education needs have been identified at this time  Skin:  Skin Assessment: Reviewed RN Assessment  Last BM:   PTA/unknown  Height:   Ht Readings from Last 1 Encounters:  06/11/20 6\' 4"  (1.93 m)    Weight:   Wt Readings from Last 1 Encounters:  06/11/20 76.3 kg    Estimated Nutritional Needs:  Kcal:  1525-1755 kcal Protein:  75-90 grams Fluid:  >/= 2.2 L/day     06/13/20, MS, RD, LDN, CNSC Inpatient Clinical Dietitian RD pager # available in [facility]  After hours/weekend pager # available in [facility]

## 2020-06-13 NOTE — Procedures (Signed)
Patient Name: Jacob Rich  MRN: [id number]  Epilepsy Attending: Charlsie Quest  Referring Physician/Provider: Dr. Midge Minium Date: 06/12/2020 Duration: 23.17 mins  Patient history: 86 year old male with history of advanced dementia who presented after persistent nausea and vomiting as well as an episode of whole body shaking.  EEG to evaluate for seizures.  Level of alertness: Awake, asleep  AEDs during EEG study: None  Technical aspects: This EEG study was done with scalp electrodes positioned according to the 10-20 International system of electrode placement. Electrical activity was acquired at a sampling rate of 500Hz  and reviewed with a high frequency filter of 70Hz  and a low frequency filter of 1Hz . EEG data were recorded continuously and digitally stored.   Description: No clear posterior dominant rhythm was seen.  Sleep was characterized by vertex waves, sleep spindles (12 to 14 Hz), maximal frontocentral region. EEG showed continuous generalized 3 to 6 Hz theta-delta slowing. Hyperventilation and photic stimulation were not performed.     ABNORMALITY -Continuous slow, generalized  IMPRESSION: This study is suggestive of moderate diffuse encephalopathy, nonspecific etiology. No seizures or epileptiform discharges were seen throughout the recording.  Jacob Rich 

## 2020-06-13 NOTE — TOC Transition Note (Signed)
Transition of Care [facility]) - CM/SW Discharge Note   Patient Details  Name: Jacob Rich MRN: [id number] Date of Birth: Dec 18, 1930  [facility] CM/SW Contact:  Darleene Cleaver, LCSW Phone Number: 06/13/2020, 4:26 PM   Clinical Narrative:     CSW was informed that patient will need Home Health, and a rolling walker.  CSW spoke to [facility], they can provide home health PT.  They are unable to provide OT and RN at this time.  CSW spoke to [place] at [employer], and ordered a rolling walker for patient, it will be delivered before he leaves.  CSW signing off, plan is for patient to return back home with home health services.   Final next level of care: Home w Home Health Services Barriers to Discharge: Barriers Resolved   Patient Goals and CMS Choice Patient states their goals for this hospitalization and ongoing recovery are:: To return back home with home health services. CMS Medicare.gov Compare Post Acute Care list provided to:: Patient Represenative (must comment) Choice offered to / list presented to : Spouse  Discharge Placement                       Discharge Plan and Services                DME Arranged: Walker rolling DME Agency: [facility] Date DME Agency Contacted: 06/13/20 Time DME Agency Contacted: [phone] Representative spoke with at DME Agency: Velna Hatchet HH Arranged: PT HH Agency: [facility] Date [facility] Agency Contacted: 06/13/20 Time HH Agency Contacted: [phone] Representative spoke with at [facility] Agency: Arline Asp  Social Determinants of Health (SDOH) Interventions     Readmission Risk Interventions No flowsheet data found.

## 2020-06-13 NOTE — Plan of Care (Signed)

## 2020-06-13 NOTE — Discharge Summary (Signed)
Physician Discharge Summary  Jacob Rich XBM:[id number] DOB: Feb 16, 1931 DOA: 06/11/2020  PCP: Renaye Rakers, MD  Admit date: 06/11/2020 Discharge date: 06/13/2020  Admitted From: home  Disposition:  Home   Recommendations for Outpatient Follow-up and new medication changes:  1. Follow up with Dr. Parke Simmers in 7 days.  2. Added pantoprazole 40 mg daily. 3. Follow up with General Surgery for gallstones.    Home Health: yes   Equipment/Devices:  Walker   Discharge Condition: stable  CODE STATUS: DNR  Diet recommendation: regular diet.    Brief/Interim Summary: Jacob Rich was admitted to the hospital with working diagnosis of intractable nausea and vomiting due to gastritis in the setting of gallstone, no cholecystitis.   86 year old male with past medical history of advanced dementia, he presented with persistent nausea, vomiting and abdominal pain.  Patient was unable to tolerate p.o. apparently at home on the same day of admission he had multiple episodes of nausea vomiting, on one occasion he lost his consciousness briefly.  EMS was called and he was brought to the hospital. On his initial physical examination blood pressure 136/83, heart rate 107, respiratory rate 18, temperature 98.6, oxygen saturation 97%, lungs with no rhonchi or rales, heart S1-S2, present rhythmic, abdomen soft nontender, no lower extremity edema. Sodium 141, potassium 4.1, chloride 107, bicarb 24, glucose 114, BUN 23, creatinine 1.34, white count 11.6, hemoglobin 14.1, hematocrit 41.7, platelets 204. AST 27. ALT 20, T bil 2,1.  SARS COVID-19 negative.  Urinalysis specific gravity 1.020.  Negative white cells.  Head CT with generalized atrophy no evidence of acute changes.  CT abdomen with cholelithiasis without acute cholecystitis.  No gallbladder wall thickening or biliary dilatation.  No pancreatic duct dilatation or surrounding inflammatory changes. Chronic lateral eighth and ninth right rib fractures.  Chest  radiograph with left base atelectasis.  EKG 124 bpm, normal axis, normal intervals, sinus rhythm, no ST segment or T wave changes.  Patient was placed on supportive medical therapy including intravenous fluids and neuro checks.  No further syncope.  No further nausea, abdominal pain or vomiting.  He was able to tolerate a p.o. diet adequately.  Continue pantoprazole at discharge, follow-up with surgery for gallstones.   1.  Intractable nausea and vomiting possible gastritis/positive gallstones but no cholecystitis..  Patient was placed on antiacids, intravenous fluids and as needed antiemetics.  His symptoms improved, at discharge no further nausea, vomiting abdominal pain.  He has been out of bed to the chair.  Due to his dementia unable to undergo HIDA scan without sedation. At this point his clinical presentation is resolving, no signs of acute cholecystitis, his white cell count has been stable between 11 and 13.  His AST is 25, ALT 23, total bilirubin is down to 1.7 from 2.1.  He has been afebrile.  Patient will have follow-up as an outpatient with surgery for gallstones.  2.  Syncope.  Likely vasovagal related to nausea and vomiting, further work-up with EEG rule out seizure.  Patient unable to undergo brain MRI due to dementia and agitation.  3.  Dementia with metabolic encephalopathy.  Patient has remained stable, episodic agitation during his hospitalization at night.  Received intravenous fluids along with thiamine and multivitamins. Physical therapy recommended continue home health services. Continue donepezil.  4.  Acute bronchitis.  Episodic cough, patient received bronchodilators and antitussive agents.  He ruled out for pneumonia.   Discharge Diagnoses:  Principal Problem:   Nausea & vomiting Active Problems:   Dementia with behavioral  disturbance (HCC)   Acute encephalopathy   Convulsion (HCC)   Intractable nausea and vomiting    Discharge Instructions   Allergies  as of 06/13/2020   No Known Allergies     Medication List    TAKE these medications   acetaminophen 325 MG tablet Commonly known as: TYLENOL Take 2 tablets (650 mg total) by mouth every 6 (six) hours as needed for mild pain (or Fever >/= 101).   donepezil 10 MG tablet Commonly known as: ARICEPT Take 1 tablet (10 mg total) by mouth at bedtime. What changed: when to take this   folic acid 1 MG tablet Commonly known as: FOLVITE Take 1 mg by mouth at bedtime.   multivitamins with iron Tabs tablet Take 1 tablet by mouth daily. Start taking on: June 14, 2020   pantoprazole 40 MG tablet Commonly known as: PROTONIX Take 1 tablet (40 mg total) by mouth daily. Start taking on: June 14, 2020   VITAMIN B-12 PO Take 1 tablet by mouth daily with lunch.       No Known Allergies      Procedures/Studies: EEG  Result Date: 06/13/2020 Charlsie Quest, MD     06/13/2020  8:27 AM Patient Name: Jacob Rich MRN: [id number] Epilepsy Attending: Charlsie Quest Referring Physician/Provider: Dr. Midge Minium Date: 06/12/2020 Duration: 23.17 mins Patient history: 86 year old male with history of advanced dementia who presented after persistent nausea and vomiting as well as an episode of whole body shaking.  EEG to evaluate for seizures. Level of alertness: Awake, asleep AEDs during EEG study: None Technical aspects: This EEG study was done with scalp electrodes positioned according to the 10-20 International system of electrode placement. Electrical activity was acquired at a sampling rate of  and reviewed with a high frequency filter of  and a low frequency filter of . EEG data were recorded continuously and digitally stored. Description: No clear posterior dominant rhythm was seen.  Sleep was characterized by vertex waves, sleep spindles (12 to 14 Hz), maximal frontocentral region. EEG showed continuous generalized 3 to 6 Hz theta-delta slowing. Hyperventilation and  photic stimulation were not performed.   ABNORMALITY -Continuous slow, generalized IMPRESSION: This study is suggestive of moderate diffuse encephalopathy, nonspecific etiology. No seizures or epileptiform discharges were seen throughout the recording. Charlsie Quest   CT Head Wo Contrast  Result Date: 06/11/2020 CLINICAL DATA:  Fever and vomiting. EXAM: CT HEAD WITHOUT CONTRAST TECHNIQUE: Contiguous axial images were obtained from the base of the skull through the vertex without intravenous contrast. COMPARISON:  September 08, 2019 FINDINGS: Brain: There is mild cerebral atrophy with widening of the extra-axial spaces and ventricular dilatation. There are areas of decreased attenuation within the white matter tracts of the supratentorial brain, consistent with microvascular disease changes. Vascular: No hyperdense vessel or unexpected calcification. Skull: Normal. Negative for fracture or focal lesion. Sinuses/Orbits: No acute finding. Other: Mild scalp soft tissue swelling is seen along the vertex on the right. IMPRESSION: Generalized cerebral atrophy, without evidence of acute intracranial abnormality. Electronically Signed   By: Aram Candela M.D.   On: 06/11/2020 19:58   CT ABDOMEN PELVIS W CONTRAST  Result Date: 06/11/2020 CLINICAL DATA:  Vomiting and fever. EXAM: CT ABDOMEN AND PELVIS WITH CONTRAST TECHNIQUE: Multidetector CT imaging of the abdomen and pelvis was performed using the standard protocol following bolus administration of intravenous contrast. CONTRAST:  OMNIPAQUE IOHEXOL 300 MG/ML  SOLN COMPARISON:  None. FINDINGS: Lower chest: Mild to moderate severity scarring and  fibrosis is seen involving the bilateral lung bases. Hepatobiliary: No focal liver abnormality is seen. Multiple subcentimeter gallstones are seen within the gallbladder lumen without evidence of gallbladder wall thickening or biliary dilatation. Pancreas: Unremarkable. No pancreatic ductal dilatation or  surrounding inflammatory changes. Spleen: Normal in size without focal abnormality. Adrenals/Urinary Tract: Adrenal glands are unremarkable. Kidneys are normal, without renal calculi, focal lesion, or hydronephrosis. Bladder is unremarkable. Stomach/Bowel: There is a small hiatal hernia. Appendix appears normal. No evidence of bowel dilatation. Noninflamed diverticula are seen throughout the large bowel. Vascular/Lymphatic: There is moderate to marked severity calcification of the abdominal aorta and bilateral common iliac arteries, without evidence of aneurysmal dilatation. No enlarged abdominal or pelvic lymph nodes. Reproductive: Prostate gland is moderately enlarged. Moderate severity prostate calcification is also seen. Other: No abdominal wall hernia or abnormality. No abdominopelvic ascites. Musculoskeletal: A very small amount of air is seen within the subcutaneous fat along the anterior aspect of the lower pelvic wall on the right. Chronic lateral eighth and ninth right rib fractures are seen. Multilevel degenerative changes seen throughout the lumbar spine. IMPRESSION: 1. Cholelithiasis without evidence of acute cholecystitis. 2. Colonic diverticulosis. 3. Small hiatal hernia. 4. Chronic lateral eighth and ninth right rib fractures. 5. Aortic atherosclerosis. 6. Enlarged prostate gland. Aortic Atherosclerosis (ICD10-I70.0). Electronically Signed   By: Aram Candelahaddeus  Houston M.D.   On: 06/11/2020 23:36   DG Chest Port 1 View  Result Date: 06/11/2020 CLINICAL DATA:  Vomiting. EXAM: PORTABLE CHEST 1 VIEW COMPARISON:  Jan 04, 2020 FINDINGS: Mild, stable hazy areas of atelectasis are seen within the bilateral lung bases, left greater than right. There is no evidence of a pleural effusion or pneumothorax. The cardiac silhouette is mildly enlarged and unchanged in size. Multilevel degenerative changes seen throughout the thoracic spine. IMPRESSION: Mild, stable bibasilar atelectasis, left greater than right.  Electronically Signed   By: Aram Candelahaddeus  Houston M.D.   On: 06/11/2020 17:26        Subjective: Patient is feeling better, no nausea or vomiting, no chest pain or dyspnea.   Discharge Exam: Vitals:   06/12/20 2224 06/13/20 0459  BP: (!) 108/45 (!) 112/57  Pulse: 69 72  Resp: 18 18  Temp:    SpO2: 93%    Vitals:   06/12/20 0911 06/12/20 1320 06/12/20 2224 06/13/20 0459  BP: (!) 115/55 114/61 (!) 108/45 (!) 112/57  Pulse: 70 64 69 72  Resp:  19 18 18   Temp: 99 F (37.2 C) 98.4 F (36.9 C)    TempSrc: Oral Oral Axillary (S) Oral  SpO2: 100% 98% 93%   Weight:      Height:        General: Not in pain or dyspnea  Neurology: Awake and alert, non focal  E ENT: no pallor, no icterus, oral mucosa moist Cardiovascular: No JVD. S1-S2 present, rhythmic, no gallops, rubs, or murmurs. No lower extremity edema. Pulmonary: positive breath sounds bilaterally, with no wheezing, rhonchi or rales. Gastrointestinal. Abdomen soft and non tender, murphy sign is negative.  Skin. No rashes Musculoskeletal: no joint deformities   The results of significant diagnostics from this hospitalization (including imaging, microbiology, ancillary and laboratory) are listed below for reference.     Microbiology: Recent Results (from the past 240 hour(s))  Respiratory Panel by RT PCR (Flu A&B, Covid) - Nasopharyngeal Swab     Status: None   Collection Time: 06/11/20  5:05 PM   Specimen: Nasopharyngeal Swab  Result Value Ref Range Status   SARS Coronavirus  2 by RT PCR NEGATIVE NEGATIVE Final    Comment: (NOTE) SARS-CoV-2 target nucleic acids are NOT DETECTED.  The SARS-CoV-2 RNA is generally detectable in upper respiratoy specimens during the acute phase of infection. The lowest concentration of SARS-CoV-2 viral copies this assay can detect is 131 copies/mL. A negative result does not preclude SARS-Cov-2 infection and should not be used as the sole basis for treatment or other patient management  decisions. A negative result may occur with  improper specimen collection/handling, submission of specimen other than nasopharyngeal swab, presence of viral mutation(s) within the areas targeted by this assay, and inadequate number of viral copies (<131 copies/mL). A negative result must be combined with clinical observations, patient history, and epidemiological information. The expected result is Negative.  Fact Sheet for Patients:  https://www.moore.com/  Fact Sheet for Healthcare Providers:  https://www.young.biz/  This test is no t yet approved or cleared by the [place] FDA and  has been authorized for detection and/or diagnosis of SARS-CoV-2 by FDA under an Emergency Use Authorization (EUA). This EUA will remain  in effect (meaning this test can be used) for the duration of the COVID-19 declaration under Section 564(b)(1) of the Act, 21 U.S.C. section 360bbb-3(b)(1), unless the authorization is terminated or revoked sooner.     Influenza A by PCR NEGATIVE NEGATIVE Final   Influenza B by PCR NEGATIVE NEGATIVE Final    Comment: (NOTE) The Xpert Xpress SARS-CoV-2/FLU/RSV assay is intended as an aid in  the diagnosis of influenza from Nasopharyngeal swab specimens and  should not be used as a sole basis for treatment. Nasal washings and  aspirates are unacceptable for Xpert Xpress SARS-CoV-2/FLU/RSV  testing.  Fact Sheet for Patients: https://www.moore.com/  Fact Sheet for Healthcare Providers: https://www.young.biz/  This test is not yet approved or cleared by the [place] FDA and  has been authorized for detection and/or diagnosis of SARS-CoV-2 by  FDA under an Emergency Use Authorization (EUA). This EUA will remain  in effect (meaning this test can be used) for the duration of the  Covid-19 declaration under Section 564(b)(1) of the Act, 21  U.S.C. section 360bbb-3(b)(1), unless the  authorization is  terminated or revoked. Performed at [facility], [address] [place], [place] [address]   Blood culture (routine single)     Status: None (Preliminary result)   Collection Time: 06/11/20  5:35 PM   Specimen: BLOOD  Result Value Ref Range Status   Specimen Description   Final    BLOOD RIGHT ANTECUBITAL Performed at [facility], [address], [place], [place] [address]    Special Requests   Final    BOTTLES DRAWN AEROBIC AND ANAEROBIC Blood Culture adequate volume Performed at [facility], [address] [place], [place] [address]    Culture   Final    NO GROWTH 1 DAY Performed at [facility], [address], [place], [place] [address]    Report Status PENDING  Incomplete  Urine culture     Status: Abnormal   Collection Time: 06/11/20  6:41 PM   Specimen: In/Out Cath Urine  Result Value Ref Range Status   Specimen Description   Final    IN/OUT CATH URINE Performed at [facility], [address] [place], [place] [address]    Special Requests   Final    NONE Performed at [facility], [address] [place], [place] [address]    Culture MULTIPLE SPECIES  PRESENT, SUGGEST RECOLLECTION (A)  Final   Report Status 06/13/2020 FINAL  Final  Culture, blood (single)     Status: None (Preliminary result)   Collection Time: 06/11/20  6:56 PM   Specimen: BLOOD  Result Value Ref Range Status   Specimen Description   Final    BLOOD LEFT ANTECUBITAL Performed at [facility], [address], [place], [place] [address]    Special Requests   Final    BOTTLES DRAWN AEROBIC AND ANAEROBIC Blood Culture adequate volume Performed at [facility], [address] [place], [place] [address]    Culture   Final    NO GROWTH 1 DAY Performed at [facility], [address], [place], [place] [address]    Report Status PENDING   Incomplete     Labs: BNP (last 3 results) No results for input(s): BNP in the last 8760 hours. Basic Metabolic Panel: Recent Labs  Lab 06/11/20 1735  NA 141  K 4.1  CL 107  CO2 24  GLUCOSE 114*  BUN 23  CREATININE 1.34*  CALCIUM 9.3   Liver Function Tests: Recent Labs  Lab 06/11/20 1735 06/12/20 0008  AST 27 25  ALT 20 23  ALKPHOS 74 56  BILITOT 2.1* 1.7*  PROT 7.1 5.8*  ALBUMIN 4.4 3.5   No results for input(s): LIPASE, AMYLASE in the last 168 hours. No results for input(s): AMMONIA in the last 168 hours. CBC: Recent Labs  Lab 06/11/20 1735 06/12/20 0008  WBC 11.6* 13.8*  NEUTROABS 10.6* 12.4*  HGB 14.1 12.0*  HCT 41.7 34.4*  MCV 96.8 95.0  PLT 204 109*   Cardiac Enzymes: No results for input(s): CKTOTAL, CKMB, CKMBINDEX, TROPONINI in the last 168 hours. BNP: Invalid input(s): POCBNP CBG: Recent Labs  Lab 06/12/20 1146 06/13/20 1217  GLUCAP 106* 93   D-Dimer No results for input(s): DDIMER in the last 72 hours. Hgb A1c No results for input(s): HGBA1C in the last 72 hours. Lipid Profile No results for input(s): CHOL, HDL, LDLCALC, TRIG, CHOLHDL, LDLDIRECT in the last 72 hours. Thyroid function studies No results for input(s): TSH, T4TOTAL, T3FREE, THYROIDAB in the last 72 hours.  Invalid input(s): FREET3 Anemia work up No results for input(s): VITAMINB12, FOLATE, FERRITIN, TIBC, IRON, RETICCTPCT in the last 72 hours. Urinalysis    Component Value Date/Time   COLORURINE YELLOW 06/11/2020 1703   APPEARANCEUR CLEAR 06/11/2020 1703   LABSPEC 1.020 06/11/2020 1703   PHURINE 6.0 06/11/2020 1703   GLUCOSEU NEGATIVE 06/11/2020 1703   HGBUR NEGATIVE 06/11/2020 1703   BILIRUBINUR NEGATIVE 06/11/2020 1703   KETONESUR NEGATIVE 06/11/2020 1703   PROTEINUR NEGATIVE 06/11/2020 1703   NITRITE NEGATIVE 06/11/2020 1703   LEUKOCYTESUR NEGATIVE 06/11/2020 1703   Sepsis Labs Invalid input(s): PROCALCITONIN,  WBC,  LACTICIDVEN Microbiology Recent  Results (from the past 240 hour(s))  Respiratory Panel by RT PCR (Flu A&B, Covid) - Nasopharyngeal Swab     Status: None   Collection Time: 06/11/20  5:05 PM   Specimen: Nasopharyngeal Swab  Result Value Ref Range Status   SARS Coronavirus 2 by RT PCR NEGATIVE NEGATIVE Final    Comment: (NOTE) SARS-CoV-2 target nucleic acids are NOT DETECTED.  The SARS-CoV-2 RNA is generally detectable in upper respiratoy specimens during the acute phase of infection. The lowest concentration of SARS-CoV-2 viral copies this assay can detect is 131 copies/mL. A negative result does not preclude SARS-Cov-2 infection and should not be used as the sole basis for  treatment or other patient management decisions. A negative result may occur with  improper specimen collection/handling, submission of specimen other than nasopharyngeal swab, presence of viral mutation(s) within the areas targeted by this assay, and inadequate number of viral copies (<131 copies/mL). A negative result must be combined with clinical observations, patient history, and epidemiological information. The expected result is Negative.  Fact Sheet for Patients:  https://www.moore.com/  Fact Sheet for Healthcare Providers:  https://www.young.biz/  This test is no t yet approved or cleared by the [place] FDA and  has been authorized for detection and/or diagnosis of SARS-CoV-2 by FDA under an Emergency Use Authorization (EUA). This EUA will remain  in effect (meaning this test can be used) for the duration of the COVID-19 declaration under Section 564(b)(1) of the Act, 21 U.S.C. section 360bbb-3(b)(1), unless the authorization is terminated or revoked sooner.     Influenza A by PCR NEGATIVE NEGATIVE Final   Influenza B by PCR NEGATIVE NEGATIVE Final    Comment: (NOTE) The Xpert Xpress SARS-CoV-2/FLU/RSV assay is intended as an aid in  the diagnosis of influenza from Nasopharyngeal swab  specimens and  should not be used as a sole basis for treatment. Nasal washings and  aspirates are unacceptable for Xpert Xpress SARS-CoV-2/FLU/RSV  testing.  Fact Sheet for Patients: https://www.moore.com/  Fact Sheet for Healthcare Providers: https://www.young.biz/  This test is not yet approved or cleared by the [place] FDA and  has been authorized for detection and/or diagnosis of SARS-CoV-2 by  FDA under an Emergency Use Authorization (EUA). This EUA will remain  in effect (meaning this test can be used) for the duration of the  Covid-19 declaration under Section 564(b)(1) of the Act, 21  U.S.C. section 360bbb-3(b)(1), unless the authorization is  terminated or revoked. Performed at [facility], [address], [place], [place] [address]   Blood culture (routine single)     Status: None (Preliminary result)   Collection Time: 06/11/20  5:35 PM   Specimen: BLOOD  Result Value Ref Range Status   Specimen Description   Final    BLOOD RIGHT ANTECUBITAL Performed at [facility], [address] [place] [address], [place], [place] [address]    Special Requests   Final    BOTTLES DRAWN AEROBIC AND ANAEROBIC Blood Culture adequate volume Performed at [facility], [address], [place], [place] [address]    Culture   Final    NO GROWTH 1 DAY Performed at [facility], [address] [place], [place] [address]    Report Status PENDING  Incomplete  Urine culture     Status: Abnormal   Collection Time: 06/11/20  6:41 PM   Specimen: In/Out Cath Urine  Result Value Ref Range Status   Specimen Description   Final    IN/OUT CATH URINE Performed at [facility], [address], [place], [place] [address]    Special Requests   Final    NONE Performed at [facility], [address], [place], [place] [address]    Culture MULTIPLE SPECIES PRESENT,  SUGGEST RECOLLECTION (A)  Final   Report Status 06/13/2020 FINAL  Final  Culture, blood (single)     Status: None (Preliminary result)   Collection Time: 06/11/20  6:56 PM   Specimen: BLOOD  Result Value Ref Range Status   Specimen Description   Final    BLOOD LEFT ANTECUBITAL Performed at [facility], [address] [place],  [place] [address]    Special Requests   Final    BOTTLES DRAWN AEROBIC AND ANAEROBIC Blood Culture adequate volume Performed at [facility], [address] [place], [place] [address]    Culture   Final    NO GROWTH 1 DAY Performed at [facility], [address] [place], [place] [address]    Report Status PENDING  Incomplete     Time coordinating discharge: 45 minutes  SIGNED:   Coralie Keens, MD  [facility] 06/13/2020, 1:19 PM

## 2020-06-13 NOTE — Evaluation (Signed)
Physical Therapy Evaluation Patient Details Name: Jacob Rich MRN: [id number] DOB: [birth year] Today's Date: 06/13/2020   History of Present Illness  Jacob Rich is a 86 y.o. male with history of advanced dementia was brought to the ER the patient was having persistent nausea vomiting and early in the day and had abdominal, suspected seizure, worsening cognition, likely due to metabolic encephalopathy  Clinical Impression  Pt admitted with above diagnosis. Pt ambulated 100' with RW with continuous min assist to manage RW and ongoing verbal cues to step closer to RW. Pt was unsteady when attempting to ambulate without an assistive device. Hands on assistance for mobility recommended due to fall risk.  Pt currently with functional limitations due to the deficits listed below (see PT Problem List). Pt will benefit from skilled PT to increase their independence and safety with mobility to allow discharge to the venue listed below.       Follow Up Recommendations Home health PT;Supervision/Assistance - 24 hour;Supervision for mobility/OOB; SNF if family unable to provide assistance    Equipment Recommendations  Rolling walker with 5" wheels    Recommendations for Other Services       Precautions / Restrictions Precautions Precautions: Fall Restrictions Weight Bearing Restrictions: No      Mobility  Bed Mobility Overal bed mobility: Needs Assistance Bed Mobility: Supine to Sit     Supine to sit: HOB elevated;Min assist     General bed mobility comments: use of rail, A at trunk    Transfers Overall transfer level: Needs assistance Equipment used: 1 person hand held assist Transfers: Sit to/from Stand Sit to Stand: Min assist         General transfer comment: assist to rise/steady  Ambulation/Gait Ambulation/Gait assistance: Min assist Gait Distance (Feet): 100 Feet Assistive device: Rolling walker (2 wheeled) Gait Pattern/deviations: Decreased step length -  right;Decreased step length - left;Trunk flexed Gait velocity: decr   General Gait Details: drags LLE, continuous VCs to step closer to RW and take bigger steps, min A to manage RW and for balance  Stairs            Wheelchair Mobility    Modified Rankin (Stroke Patients Only)       Balance   Sitting-balance support: Feet supported Sitting balance-Leahy Scale: Fair Sitting balance - Comments: posterior lean with feet/UEs unsupported, fair with feet supported     Standing balance-Leahy Scale: Poor                               Pertinent Vitals/Pain Pain Assessment: No/denies pain    Home Living Family/patient expects to be discharged to:: Private residence Living Arrangements: Spouse/significant other Available Help at Discharge: Family;Available 24 hours/day Type of Home: Mobile home Home Access: Stairs to enter Entrance Stairs-Rails: Left Entrance Stairs-Number of Steps: 5 Home Layout: One level Home Equipment: Grab bars - toilet;Tub bench Additional Comments: All information via wife over phone from OT eval    Prior Function Level of Independence: Independent         Comments: Wife reports that when he has had PT in the past they have not recommended any sort of AD for ambulation due to him not be coordinated enough to use.     Hand Dominance   Dominant Hand: Right    Extremity/Trunk Assessment   Upper Extremity Assessment Upper Extremity Assessment: Defer to OT evaluation    Lower Extremity Assessment Lower Extremity Assessment: Overall [facility]  for tasks assessed    Cervical / Trunk Assessment Cervical / Trunk Assessment: Kyphotic  Communication   Communication: No difficulties  Cognition Arousal/Alertness: Awake/alert Behavior During Therapy: WFL for tasks assessed/performed Overall Cognitive Status: History of cognitive impairments - at baseline                                 General Comments: oriented to self  and knows he's in a hospital, doesn't know month/year nor city he's in, can follow commands      General Comments      Exercises     Assessment/Plan    PT Assessment Patient needs continued PT services  PT Problem List Decreased balance;Decreased knowledge of use of DME;Decreased mobility       PT Treatment Interventions Gait training;Functional mobility training;Therapeutic exercise;Therapeutic activities;Patient/family education    PT Goals (Current goals can be found in the Care Plan section)  Acute Rehab PT Goals Patient Stated Goal: none stated PT Goal Formulation: Patient unable to participate in goal setting Time For Goal Achievement: 06/27/20 Potential to Achieve Goals: Fair    Frequency Min 3X/week   Barriers to discharge        Co-evaluation               AM-PAC PT "6 Clicks" Mobility  Outcome Measure Help needed turning from your back to your side while in a flat bed without using bedrails?: A Little Help needed moving from lying on your back to sitting on the side of a flat bed without using bedrails?: A Little Help needed moving to and from a bed to a chair (including a wheelchair)?: A Little Help needed standing up from a chair using your arms (e.g., wheelchair or bedside chair)?: A Little Help needed to walk in hospital room?: A Little Help needed climbing 3-5 steps with a railing? : A Little 6 Click Score: 18    End of Session Equipment Utilized During Treatment: Gait belt Activity Tolerance: Patient tolerated treatment well Patient left: in chair;with call bell/phone within reach;with chair alarm set Nurse Communication: Mobility status PT Visit Diagnosis: Difficulty in walking, not elsewhere classified (R26.2)    Time: 5852-7782 PT Time Calculation (min) (ACUTE ONLY): 18 min   Charges:   PT Evaluation $PT Eval Low Complexity: 1 Low        Tamala Ser PT 06/13/2020  Acute Rehabilitation Services Pager  [phone] Office [phone]

## 2020-06-17 LAB — CULTURE, BLOOD (SINGLE)
Culture: NO GROWTH
Culture: NO GROWTH
Special Requests: ADEQUATE
Special Requests: ADEQUATE

## 2020-06-26 ENCOUNTER — Other Ambulatory Visit: Payer: Self-pay

## 2020-06-26 ENCOUNTER — Encounter (HOSPITAL_COMMUNITY): Payer: Self-pay

## 2020-06-26 ENCOUNTER — Emergency Department (HOSPITAL_COMMUNITY): Payer: Medicare Other

## 2020-06-26 ENCOUNTER — Inpatient Hospital Stay (HOSPITAL_COMMUNITY)
Admission: EM | Admit: 2020-06-26 | Discharge: 2020-07-05 | DRG: 690 | Disposition: A | Discharge status: Skilled Nursing Facility | Payer: Medicare Other | Attending: Internal Medicine | Admitting: Internal Medicine

## 2020-06-26 DIAGNOSIS — F0390 Unspecified dementia without behavioral disturbance: Secondary | ICD-10-CM | POA: Diagnosis present

## 2020-06-26 DIAGNOSIS — R5383 Other fatigue: Secondary | ICD-10-CM | POA: Diagnosis not present

## 2020-06-26 DIAGNOSIS — N390 Urinary tract infection, site not specified: Secondary | ICD-10-CM | POA: Diagnosis not present

## 2020-06-26 DIAGNOSIS — Z20822 Contact with and (suspected) exposure to covid-19: Secondary | ICD-10-CM | POA: Diagnosis present

## 2020-06-26 DIAGNOSIS — Z66 Do not resuscitate: Secondary | ICD-10-CM | POA: Diagnosis present

## 2020-06-26 DIAGNOSIS — R569 Unspecified convulsions: Secondary | ICD-10-CM | POA: Diagnosis present

## 2020-06-26 DIAGNOSIS — Z8614 Personal history of Methicillin resistant Staphylococcus aureus infection: Secondary | ICD-10-CM

## 2020-06-26 DIAGNOSIS — E872 Acidosis: Secondary | ICD-10-CM | POA: Diagnosis present

## 2020-06-26 DIAGNOSIS — R5381 Other malaise: Secondary | ICD-10-CM | POA: Diagnosis present

## 2020-06-26 DIAGNOSIS — G9340 Encephalopathy, unspecified: Secondary | ICD-10-CM | POA: Diagnosis present

## 2020-06-26 DIAGNOSIS — N179 Acute kidney failure, unspecified: Secondary | ICD-10-CM | POA: Diagnosis present

## 2020-06-26 DIAGNOSIS — N3001 Acute cystitis with hematuria: Secondary | ICD-10-CM

## 2020-06-26 DIAGNOSIS — Z87440 Personal history of urinary (tract) infections: Secondary | ICD-10-CM

## 2020-06-26 DIAGNOSIS — Z79899 Other long term (current) drug therapy: Secondary | ICD-10-CM

## 2020-06-26 LAB — COMPREHENSIVE METABOLIC PANEL
ALT: 18 U/L (ref 0–44)
AST: 25 U/L (ref 15–41)
Albumin: 4.1 g/dL (ref 3.5–5.0)
Alkaline Phosphatase: 69 U/L (ref 38–126)
Anion gap: 17 — ABNORMAL HIGH (ref 5–15)
BUN: 28 mg/dL — ABNORMAL HIGH (ref 8–23)
CO2: 20 mmol/L — ABNORMAL LOW (ref 22–32)
Calcium: 9.5 mg/dL (ref 8.9–10.3)
Chloride: 103 mmol/L (ref 98–111)
Creatinine, Ser: 1.52 mg/dL — ABNORMAL HIGH (ref 0.61–1.24)
GFR, Estimated: 44 mL/min — ABNORMAL LOW (ref >60)
Glucose, Bld: 91 mg/dL (ref 70–99)
Potassium: 4.8 mmol/L (ref 3.5–5.1)
Sodium: 140 mmol/L (ref 135–145)
Total Bilirubin: 1.7 mg/dL — ABNORMAL HIGH (ref 0.3–1.2)
Total Protein: 6.9 g/dL (ref 6.5–8.1)

## 2020-06-26 LAB — CBC WITH DIFFERENTIAL/PLATELET
Abs Immature Granulocytes: 0.02 10*3/uL (ref 0.00–0.07)
Basophils Absolute: 0.1 10*3/uL (ref 0.0–0.1)
Basophils Relative: 1 %
Eosinophils Absolute: 0.1 10*3/uL (ref 0.0–0.5)
Eosinophils Relative: 2 %
HCT: 46.9 % (ref 39.0–52.0)
Hemoglobin: 15.8 g/dL (ref 13.0–17.0)
Immature Granulocytes: 0 %
Lymphocytes Relative: 15 %
Lymphs Abs: 1.2 10*3/uL (ref 0.7–4.0)
MCH: 32.5 pg (ref 26.0–34.0)
MCHC: 33.7 g/dL (ref 30.0–36.0)
MCV: 96.5 fL (ref 80.0–100.0)
Monocytes Absolute: 0.7 10*3/uL (ref 0.1–1.0)
Monocytes Relative: 9 %
Neutro Abs: 5.8 10*3/uL (ref 1.7–7.7)
Neutrophils Relative %: 73 %
Platelets: 357 10*3/uL (ref 150–400)
RBC: 4.86 MIL/uL (ref 4.22–5.81)
RDW: 11.8 % (ref 11.5–15.5)
WBC: 7.9 10*3/uL (ref 4.0–10.5)
nRBC: 0 % (ref 0.0–0.2)

## 2020-06-26 LAB — RESPIRATORY PANEL BY RT PCR (FLU A&B, COVID)
Influenza A by PCR: NEGATIVE
Influenza B by PCR: NEGATIVE
SARS Coronavirus 2 by RT PCR: NEGATIVE

## 2020-06-26 LAB — D-DIMER, QUANTITATIVE: D-Dimer, Quant: 1.03 ug/mL-FEU — ABNORMAL HIGH (ref 0.00–0.50)

## 2020-06-26 MED ORDER — ZIPRASIDONE MESYLATE 20 MG IM SOLR
10.0000 mg | Freq: Once | INTRAMUSCULAR | Status: AC
  Administered 2020-06-26: 10 mg via INTRAMUSCULAR
  Filled 2020-06-26: qty 20

## 2020-06-26 MED ORDER — SODIUM CHLORIDE 0.9 % IV BOLUS
500.0000 mL | Freq: Once | INTRAVENOUS | Status: AC
  Administered 2020-06-26: 500 mL via INTRAVENOUS

## 2020-06-26 MED ORDER — HALOPERIDOL LACTATE 5 MG/ML IJ SOLN
2.0000 mg | Freq: Once | INTRAMUSCULAR | Status: AC
  Administered 2020-06-26: 2 mg via INTRAMUSCULAR
  Filled 2020-06-26: qty 1

## 2020-06-26 MED ORDER — ZIPRASIDONE MESYLATE 20 MG IM SOLR: 20.0000 mg | Freq: Once | INTRAMUSCULAR | Status: DC

## 2020-06-26 MED ORDER — STERILE WATER FOR INJECTION IJ SOLN
INTRAMUSCULAR | Status: AC
  Filled 2020-06-26: qty 10

## 2020-06-26 MED ORDER — DONEPEZIL HCL 10 MG PO TABS
10.0000 mg | ORAL_TABLET | Freq: Every day | ORAL | Status: DC
  Administered 2020-06-27 – 2020-07-04 (×7): 10 mg via ORAL
  Filled 2020-06-26 (×8): qty 1

## 2020-06-26 NOTE — ED Notes (Signed)
Attempted to reposition pt, pt becoming more aggressive and violent with staff. Pt making a fist and attempting to swing at staff. Pt screaming "get out of my room. Goodbye. Get that little girl out of my room.." Staff attempted to redirect pt without success.

## 2020-06-26 NOTE — ED Triage Notes (Signed)
Pt BIB EMS from home, wife called stating pt has been increasingly lethargic for past 3 days. Baseline confused, dementia and wife provides care. Wife states pt will not sit up in bed for her, somewhat active with EMS and maintaining conversation. Minimal intake last 3 days.   BP 146/98 HR 88 RR 16 SpO2 98% RA Temp 98.0 CBG 95

## 2020-06-26 NOTE — ED Notes (Signed)
Will attempt to obtain vitals after med administration

## 2020-06-26 NOTE — ED Notes (Signed)
XR at bedside

## 2020-06-26 NOTE — ED Provider Notes (Signed)
[facility]-EMERGENCY DEPT Provider Note   CSN: [id number] Arrival date & time: 06/26/20  1725     History Chief Complaint  Patient presents with  . Fatigue    Jacob Rich is a 86 y.o. male presenting for evaluation of lethargy.  Level 5 caveat due to dementia.  History provided solely by EMS.  Per EMS, they were called as patient's wife was concerned about worsening lethargy over the past 3 days.  Patient will not sit up in bed, which is atypical.  Patient reports no complaints.  He does not know why he is in the hospital.  Additional history obtained from chart review.  Patient with a history of dementia and recent hospitalization for altered mental status/confusion.   HPI     Past Medical History:  Diagnosis Date  . Dementia (HCC)   . MRSA infection     Patient Active Problem List   Diagnosis Date Noted  . Convulsion (HCC) 06/12/2020  . Intractable nausea and vomiting 06/12/2020  . Acute encephalopathy 06/11/2020  . Nausea & vomiting 06/11/2020  . Complicated UTI (urinary tract infection) 01/05/2020  . Dementia (HCC) 01/05/2020  . UTI (urinary tract infection) 06/17/2017  . Traumatic closed displaced fracture of shoulder with anterior dislocation with routine healing 06/10/2016  . Dementia with behavioral disturbance (HCC) 04/16/2016    Past Surgical History:  Procedure Laterality Date  . MRSa     2006/2007  . OTHER SURGICAL HISTORY     Dicverticulitis       Family History  Problem Relation Age of Onset  . Glaucoma Mother     Social History   Tobacco Use  . Smoking status: Never Smoker  . Smokeless tobacco: Never Used  Substance Use Topics  . Alcohol use: Not Currently    Comment: rare  . Drug use: No    Home Medications Prior to Admission medications   Medication Sig Start Date End Date Taking? Authorizing Provider  acetaminophen (TYLENOL) 325 MG tablet Take 2 tablets (650 mg total) by mouth every 6 (six) hours as  needed for mild pain (or Fever >/= 101). 01/06/20   Lonia Blood, MD  Cyanocobalamin (VITAMIN B-12 PO) Take 1 tablet by mouth daily with lunch.    [provider]  donepezil (ARICEPT) 10 MG tablet Take 1 tablet (10 mg total) by mouth at bedtime. Patient taking differently: Take 10 mg by mouth daily.  04/16/16   Anson Fret, MD  folic acid (FOLVITE) 1 MG tablet Take 1 mg by mouth at bedtime.  08/18/19   [provider]  Multiple Vitamins-Iron (MULTIVITAMINS WITH IRON) TABS tablet Take 1 tablet by mouth daily. 06/14/20 07/14/20  Arrien, York Ram, MD  pantoprazole (PROTONIX) 40 MG tablet Take 1 tablet (40 mg total) by mouth daily. 06/14/20 07/14/20  Arrien, York Ram, MD    Allergies    Patient has no known allergies.  Review of Systems   Review of Systems  Unable to perform ROS: Dementia    Physical Exam Updated Vital Signs BP 135/79 (BP Location: Right Arm)   Pulse 92   Temp 99.1 F (37.3 C) (Rectal)   Resp 16   Ht 6\' 4"  (1.93 m)   Wt 76.3 kg   SpO2 100%   BMI 20.48 kg/m   Physical Exam Vitals and nursing note reviewed.  Constitutional:      General: He is not in acute distress.    Appearance: He is well-developed.     Comments:  Elderly male who is confused, in no acute distress  HENT:     Head: Normocephalic and atraumatic.  Eyes:     Conjunctiva/sclera: Conjunctivae normal.     Pupils: Pupils are equal, round, and reactive to light.  Cardiovascular:     Rate and Rhythm: Normal rate and regular rhythm.     Pulses: Normal pulses.  Pulmonary:     Effort: Pulmonary effort is normal. No respiratory distress.     Breath sounds: Normal breath sounds. No wheezing.     Comments: Speaking in full sentences.  Clear lung sounds in all fields. Abdominal:     General: There is no distension.     Palpations: Abdomen is soft. There is no mass.     Tenderness: There is no abdominal tenderness. There is no guarding or rebound.    Musculoskeletal:        General: Normal range of motion.     Cervical back: Normal range of motion and neck supple.  Skin:    General: Skin is warm and dry.     Capillary Refill: Capillary refill takes less than 2 seconds.  Neurological:     Mental Status: He is alert.     GCS: GCS eye subscore is 4. GCS verbal subscore is 5. GCS motor subscore is 6.     Comments: Confused, unknown baseline.  GCS of 15.     ED Results / Procedures / Treatments   Labs (all labs ordered are listed, but only abnormal results are displayed) Labs Reviewed  URINE CULTURE  RESPIRATORY PANEL BY RT PCR (FLU A&B, COVID)  CBC WITH DIFFERENTIAL/PLATELET  COMPREHENSIVE METABOLIC PANEL  URINALYSIS, ROUTINE W REFLEX MICROSCOPIC  D-DIMER, QUANTITATIVE (NOT AT [facility])    EKG None  Radiology No results found.  Procedures Procedures (including critical care time)  Medications Ordered in ED Medications - No data to display  ED Course  I have reviewed the triage vital signs and the nursing notes.  Pertinent labs & imaging results that were available during my care of the patient were reviewed by me and considered in my medical decision making (see chart for details).    MDM Rules/Calculators/A&P                          Patient presenting for evaluation of reported lethargy.  On exam, patient appears pleasantly confused.  However when patient encouraged to go from laying to sitting, he became tachycardic and hypoxic, with good Plath.  His hypoxia quickly resolved, however he remained tachycardic.  Consider dehydration.  Consider PE.  Consider infection.  He will need labs, chest x-ray, urine.  Patient was recently admitted for confusion, will not repeat head CT as it was normal a few weeks ago.  I attempted to call pt's wife, no response.   Labs interpreted by me, mild signs of dehydration.  Dimer mildly elevated at 1.03, he will need CTA.  No leukocytosis.  Hemoglobin stable.  Electrolytes otherwise  stable.  Informed by RN that patient is becoming aggressive.  He has sundowning, and has a history of the same.  Haldol ordered.  Pt sleeping, but continues ot be aggressive with any stimuli. geodon given.   Pt signed out to [facility], PA-C for f/u on CTA and UA. If normal, pt will need to ambulate with pulse ox prior to d/c.    Final Clinical Impression(s) / ED Diagnoses Final diagnoses:  None    Rx / DC  Orders ED Discharge Orders    None       Alveria Apley, PA-C 06/26/20 2323    Derwood Kaplan, MD 07/03/20 [phone]

## 2020-06-26 NOTE — ED Notes (Addendum)
Pt agreed to allow staff to draw blood but "that needle must get out"

## 2020-06-26 NOTE — ED Notes (Signed)
Staff attempted to apply safety mitts to pt in order to continue monitoring vitals. Pt began swinging at staff members. Will continue to monitor

## 2020-06-26 NOTE — ED Notes (Signed)
Pt removed all cardiac leads, pulse ox and blood pressure cuff. Pt refusing all blood work and keeps stating "I just want to go home. Call cheryl. Where is Jacob Rich". Provider notified

## 2020-06-26 NOTE — ED Notes (Signed)
Attempted to update vitals, pt becoming aggressive again. Provider notified

## 2020-06-26 NOTE — ED Notes (Signed)
Attempted to do an IV, but pt becoming violent with staff again.

## 2020-06-27 ENCOUNTER — Emergency Department (HOSPITAL_COMMUNITY): Payer: Medicare Other

## 2020-06-27 ENCOUNTER — Other Ambulatory Visit: Payer: Self-pay

## 2020-06-27 ENCOUNTER — Encounter (HOSPITAL_COMMUNITY): Payer: Self-pay | Admitting: Student

## 2020-06-27 DIAGNOSIS — N390 Urinary tract infection, site not specified: Secondary | ICD-10-CM | POA: Diagnosis not present

## 2020-06-27 DIAGNOSIS — F0391 Unspecified dementia with behavioral disturbance: Secondary | ICD-10-CM | POA: Diagnosis not present

## 2020-06-27 DIAGNOSIS — R400 Somnolence: Secondary | ICD-10-CM | POA: Diagnosis not present

## 2020-06-27 LAB — LACTIC ACID, PLASMA
Lactic Acid, Venous: 2.2 mmol/L (ref 0.5–1.9)
Lactic Acid, Venous: 1.2 mmol/L (ref 0.5–1.9)

## 2020-06-27 LAB — RENAL FUNCTION PANEL
Albumin: 3.3 g/dL — ABNORMAL LOW (ref 3.5–5.0)
Anion gap: 15 (ref 5–15)
BUN: 24 mg/dL — ABNORMAL HIGH (ref 8–23)
CO2: 19 mmol/L — ABNORMAL LOW (ref 22–32)
Calcium: 8.8 mg/dL — ABNORMAL LOW (ref 8.9–10.3)
Chloride: 109 mmol/L (ref 98–111)
Creatinine, Ser: 1.49 mg/dL — ABNORMAL HIGH (ref 0.61–1.24)
GFR, Estimated: 45 mL/min — ABNORMAL LOW (ref >60)
Glucose, Bld: 87 mg/dL (ref 70–99)
Phosphorus: 3.2 mg/dL (ref 2.5–4.6)
Potassium: 4.5 mmol/L (ref 3.5–5.1)
Sodium: 143 mmol/L (ref 135–145)

## 2020-06-27 LAB — CBC WITH DIFFERENTIAL/PLATELET
Abs Immature Granulocytes: 0.02 10*3/uL (ref 0.00–0.07)
Basophils Absolute: 0 10*3/uL (ref 0.0–0.1)
Basophils Relative: 0 %
Eosinophils Absolute: 0.1 10*3/uL (ref 0.0–0.5)
Eosinophils Relative: 1 %
HCT: 44.1 % (ref 39.0–52.0)
Hemoglobin: 14.8 g/dL (ref 13.0–17.0)
Immature Granulocytes: 0 %
Lymphocytes Relative: 9 %
Lymphs Abs: 0.7 10*3/uL (ref 0.7–4.0)
MCH: 32.5 pg (ref 26.0–34.0)
MCHC: 33.6 g/dL (ref 30.0–36.0)
MCV: 96.9 fL (ref 80.0–100.0)
Monocytes Absolute: 0.6 10*3/uL (ref 0.1–1.0)
Monocytes Relative: 6 %
Neutro Abs: 7.3 10*3/uL (ref 1.7–7.7)
Neutrophils Relative %: 84 %
Platelets: 278 10*3/uL (ref 150–400)
RBC: 4.55 MIL/uL (ref 4.22–5.81)
RDW: 11.8 % (ref 11.5–15.5)
WBC: 8.7 10*3/uL (ref 4.0–10.5)
nRBC: 0 % (ref 0.0–0.2)

## 2020-06-27 LAB — URINALYSIS, ROUTINE W REFLEX MICROSCOPIC
Bilirubin Urine: NEGATIVE
Glucose, UA: NEGATIVE mg/dL
Ketones, ur: 20 mg/dL — AB
Nitrite: NEGATIVE
Protein, ur: 100 mg/dL — AB
Specific Gravity, Urine: >1.046 — ABNORMAL HIGH (ref 1.005–1.030)
WBC, UA: >50 WBC/hpf — ABNORMAL HIGH (ref 0–5)
pH: 5 (ref 5.0–8.0)

## 2020-06-27 LAB — MAGNESIUM: Magnesium: 2.3 mg/dL (ref 1.7–2.4)

## 2020-06-27 MED ORDER — ONDANSETRON HCL 4 MG PO TABS: 4.0000 mg | ORAL_TABLET | Freq: Four times a day (QID) | ORAL | Status: DC | PRN

## 2020-06-27 MED ORDER — HALOPERIDOL 5 MG PO TABS
5.0000 mg | ORAL_TABLET | Freq: Three times a day (TID) | ORAL | Status: DC | PRN
  Filled 2020-06-27: qty 1

## 2020-06-27 MED ORDER — IOHEXOL 350 MG/ML SOLN
100.0000 mL | Freq: Once | INTRAVENOUS | Status: AC | PRN
  Administered 2020-06-27: 80 mL via INTRAVENOUS

## 2020-06-27 MED ORDER — SODIUM CHLORIDE 0.9 % IV SOLN: Freq: Once | INTRAVENOUS | Status: AC

## 2020-06-27 MED ORDER — SODIUM CHLORIDE 0.9 % IV BOLUS
500.0000 mL | Freq: Once | INTRAVENOUS | Status: AC
  Administered 2020-06-27: 500 mL via INTRAVENOUS

## 2020-06-27 MED ORDER — ONDANSETRON HCL 4 MG/2ML IJ SOLN: 4.0000 mg | Freq: Four times a day (QID) | INTRAMUSCULAR | Status: DC | PRN

## 2020-06-27 MED ORDER — HALOPERIDOL LACTATE 5 MG/ML IJ SOLN
INTRAMUSCULAR | Status: AC
  Administered 2020-06-27: 5 mg via INTRAMUSCULAR
  Filled 2020-06-27: qty 1

## 2020-06-27 MED ORDER — HALOPERIDOL LACTATE 5 MG/ML IJ SOLN
5.0000 mg | Freq: Three times a day (TID) | INTRAMUSCULAR | Status: DC | PRN
  Administered 2020-06-29: 5 mg via INTRAMUSCULAR
  Filled 2020-06-27: qty 1

## 2020-06-27 MED ORDER — SODIUM CHLORIDE 0.9 % IV SOLN
1.0000 g | Freq: Once | INTRAVENOUS | Status: AC
  Administered 2020-06-27: 1 g via INTRAVENOUS
  Filled 2020-06-27: qty 10

## 2020-06-27 MED ORDER — SODIUM CHLORIDE (PF) 0.9 % IJ SOLN
INTRAMUSCULAR | Status: AC
  Filled 2020-06-27: qty 50

## 2020-06-27 MED ORDER — ACETAMINOPHEN 650 MG RE SUPP: 650.0000 mg | Freq: Four times a day (QID) | RECTAL | Status: DC | PRN

## 2020-06-27 MED ORDER — SODIUM CHLORIDE 0.9 % IV SOLN
1.0000 g | INTRAVENOUS | Status: DC
  Administered 2020-06-28: 10:00:00 1 g via INTRAVENOUS
  Filled 2020-06-27: qty 1
  Filled 2020-06-27: qty 10

## 2020-06-27 MED ORDER — ACETAMINOPHEN 325 MG PO TABS: 650.0000 mg | ORAL_TABLET | Freq: Four times a day (QID) | ORAL | Status: DC | PRN

## 2020-06-27 NOTE — ED Notes (Signed)
Patient pulled IV out and removed external catheter.  Patient with large incontinent urine episode as well. Linens/gown changed, new IV and external catheter replaced.  Warm blankets given and patient now resting comfortably at this time.

## 2020-06-27 NOTE — ED Notes (Signed)
Patient refused temp before moving to room 1311.

## 2020-06-27 NOTE — ED Notes (Signed)
Falls alarm is present under patient and on.  Patient resting at this time.

## 2020-06-27 NOTE — ED Provider Notes (Addendum)
Medical screening examination/treatment/procedure(s) were conducted as a shared visit with non-physician practitioner(s) and myself.  I personally evaluated the patient during the encounter.  EKG Interpretation  Date/Time:  Monday June 26 2020 18:05:05 EST Ventricular Rate:  94 PR Interval:    QRS Duration: 94 QT Interval:  375 QTC Calculation: 469 R Axis:   31 Text Interpretation: Sinus rhythm No acute changes No significant change since last tracing Confirmed by Varney Biles [phone]) on 06/26/2020 7:27:04 PM    Patient was signed out to [place], [place].  See her note for full details.  History of dementia with recent admission.  Per wife increased lethargy and inability to take care of himself at home.  He was very agitated and did receive Haldol and Geodon prior to our shift.  Shift signout to follow-up labs and urinalysis.  Unfortunately, patient has been unable to provide urine.  He did have a wet brief upon arrival.  Attempted by nursing to catheterize with normal and coud catheter were unsuccessful.  I requested a larger catheter.  Attempted catheterization with a 20 French catheter.  Met significant resistance.  Unable to catheterize.  I do feel that the urine is needed to fully evaluate the patient.  We will place a Foley bag.  He will be given an additional 500 cc of fluid.    Physical Exam  BP 103/63   Pulse (!) 59   Temp 99.1 F (37.3 C) (Rectal)   Resp 13   Ht 1.93 m (_0 )   Wt 76.3 kg   SpO2 94%   BMI 20.48 kg/m   Physical Exam  Elderly, demented Intermittently agitated No acute distress  ED Course/Procedures          Lu Paradise, Barbette Hair, MD 06/27/20 1593    Merryl Hacker, MD 06/27/20 [phone]

## 2020-06-27 NOTE — ED Notes (Signed)
Notified CT that patient has IV

## 2020-06-27 NOTE — ED Notes (Signed)
CRITICAL VALUE ALERT  Critical Value:  Lactic acid 2.2  Date & Time Notied: 06/27/2020 1347  Provider Notified: Ronaldo Miyamoto, MD  Orders Received/Actions taken: no new orders, acknowledged

## 2020-06-27 NOTE — H&P (Signed)
History and Physical    Nasri Boakye MEQ:[id number] DOB: April 28, 1931 DOA: 06/26/2020  PCP: Renaye Rakers, MD  Patient coming from: Home  Chief Complaint: lethargy  HPI: Jacob Rich is a 86 y.o. male with medical history significant of dementia. Presenting with 5 days of lethargy and poor appetite. Hx is from his wife. She reports that he was recently in the hospital for seizures and bronchitis. He completed therapy and returned home a week ago. He was fine for a couple of days, but on Thursday he started becoming more lethargic. He also has slowed on his eating. He denied any complaints to her, but he has a history of dementia. She noted over the last several days his periods of sleep have increased to the point where he essentially sleeps all day. She found that he was too weak to get out of bed yesterday, so she called EMS.    ED Course: Workup was largely unremarkable. He has a UA that was concerning for UTI, so he was started on rocephin. [facility] was called for admission.   Review of Systems:  Unable to obtain d/t mentation.   PMHx Past Medical History:  Diagnosis Date  . Dementia (HCC)   . MRSA infection     PSHx Past Surgical History:  Procedure Laterality Date  . MRSa     2006/2007  . OTHER SURGICAL HISTORY     Dicverticulitis    SocHx  reports that he has never smoked. He has never used smokeless tobacco. He reports previous alcohol use. He reports that he does not use drugs.  No Known Allergies  FamHx Family History  Problem Relation Age of Onset  . Glaucoma Mother     Prior to Admission medications   Medication Sig Start Date End Date Taking? Authorizing Provider  acetaminophen (TYLENOL) 325 MG tablet Take 2 tablets (650 mg total) by mouth every 6 (six) hours as needed for mild pain (or Fever >/= 101). 01/06/20  Yes Lonia Blood, MD  Cyanocobalamin (VITAMIN B-12 PO) Take 1 tablet by mouth daily with lunch.   Yes [provider]  donepezil (ARICEPT) 10  MG tablet Take 1 tablet (10 mg total) by mouth at bedtime. Patient taking differently: Take 10 mg by mouth daily.  04/16/16  Yes Anson Fret, MD  folic acid (FOLVITE) 1 MG tablet Take 1 mg by mouth at bedtime.  08/18/19  Yes [provider]  Multiple Vitamins-Iron (MULTIVITAMINS WITH IRON) TABS tablet Take 1 tablet by mouth daily. 06/14/20 07/14/20  Arrien, York Ram, MD  pantoprazole (PROTONIX) 40 MG tablet Take 1 tablet (40 mg total) by mouth daily. 06/14/20 07/14/20  Coralie Keens, MD    Physical Exam: Vitals:   06/27/20 0830 06/27/20 0930 06/27/20 1000 06/27/20 1030  BP: 105/67 (!) 157/60 (!) 145/57 (!) 136/52  Pulse: 63 86 62 64  Resp: 14 18 11 14   Temp:      TempSrc:      SpO2: 98% 100% 100% 99%  Weight:      Height:        General: 86 y.o. male resting in bed in NAD Eyes: PERRL, normal sclera ENMT: Nares patent w/o discharge, orophaynx clear, dentition normal, ears w/o discharge/lesions/ulcers Neck: Supple, trachea midline Cardiovascular: RRR, +S1, S2, no m/g/r, equal pulses throughout Respiratory: CTABL, no w/r/r, normal WOB GI: BS+, NDNT, no masses noted, no organomegaly noted MSK: No e/c/c Skin: No rashes, bruises, ulcerations noted Neuro: somnolent, responsive to noxious stimuli, but not cooperative,  tremulous  Labs on Admission: I have personally reviewed following labs and imaging studies  CBC: Recent Labs  Lab 06/26/20 1846  WBC 7.9  NEUTROABS 5.8  HGB 15.8  HCT 46.9  MCV 96.5  PLT 357   Basic Metabolic Panel: Recent Labs  Lab 06/26/20 1846  NA 140  K 4.8  CL 103  CO2 20*  GLUCOSE 91  BUN 28*  CREATININE 1.52*  CALCIUM 9.5   GFR: Estimated Creatinine Clearance: 37 mL/min (A) (by C-G formula based on SCr of 1.52 mg/dL (H)). Liver Function Tests: Recent Labs  Lab 06/26/20 1846  AST 25  ALT 18  ALKPHOS 69  BILITOT 1.7*  PROT 6.9  ALBUMIN 4.1   No results for input(s): LIPASE, AMYLASE in the last 168  hours. No results for input(s): AMMONIA in the last 168 hours. Coagulation Profile: No results for input(s): INR, PROTIME in the last 168 hours. Cardiac Enzymes: No results for input(s): CKTOTAL, CKMB, CKMBINDEX, TROPONINI in the last 168 hours. BNP (last 3 results) No results for input(s): PROBNP in the last 8760 hours. HbA1C: No results for input(s): HGBA1C in the last 72 hours. CBG: No results for input(s): GLUCAP in the last 168 hours. Lipid Profile: No results for input(s): CHOL, HDL, LDLCALC, TRIG, CHOLHDL, LDLDIRECT in the last 72 hours. Thyroid Function Tests: No results for input(s): TSH, T4TOTAL, FREET4, T3FREE, THYROIDAB in the last 72 hours. Anemia Panel: No results for input(s): VITAMINB12, FOLATE, FERRITIN, TIBC, IRON, RETICCTPCT in the last 72 hours. Urine analysis:    Component Value Date/Time   COLORURINE YELLOW 06/27/2020 0928   APPEARANCEUR CLEAR 06/27/2020 0928   LABSPEC >1.046 (H) 06/27/2020 0928   PHURINE 5.0 06/27/2020 0928   GLUCOSEU NEGATIVE 06/27/2020 0928   HGBUR LARGE (A) 06/27/2020 0928   BILIRUBINUR NEGATIVE 06/27/2020 0928   KETONESUR 20 (A) 06/27/2020 0928   PROTEINUR 100 (A) 06/27/2020 0928   NITRITE NEGATIVE 06/27/2020 0928   LEUKOCYTESUR MODERATE (A) 06/27/2020 0928    Radiological Exams on Admission: CT Angio Chest PE W and/or Wo Contrast  Result Date: 06/27/2020 CLINICAL DATA:  Increasingly lethargic for 3 days EXAM: CT ANGIOGRAPHY CHEST WITH CONTRAST TECHNIQUE: Multidetector CT imaging of the chest was performed using the standard protocol during bolus administration of intravenous contrast. Multiplanar CT image reconstructions and MIPs were obtained to evaluate the vascular anatomy. CONTRAST:  13mL OMNIPAQUE IOHEXOL 350 MG/ML SOLN COMPARISON:  Radiograph 06/26/2020 FINDINGS: Cardiovascular: Satisfactory opacification the pulmonary arteries to the segmental level. No pulmonary artery filling defects are identified. Central pulmonary arteries  are normal caliber. Normal heart size. No pericardial effusion. Three-vessel coronary artery atherosclerosis is noted. Suboptimal opacification of the aorta. The aortic root is suboptimally assessed given cardiac pulsation artifact. Atherosclerotic plaque within the normal caliber aorta. No clear acute luminal abnormality. No periaortic stranding or hemorrhage. Shared origin of the brachiocephalic and left common carotid arteries. Proximal great vessels are moderately calcified but otherwise free of acute abnormality. No major venous abnormality. Mediastinum/Nodes: No mediastinal fluid or gas. Normal thyroid gland and thoracic inlet. No acute abnormality of the trachea or esophagus. No worrisome mediastinal, hilar or axillary adenopathy. Lungs/Pleura: There is some mild vascular redistribution with interlobular septal thickening and some dependent areas of ground-glass opacity which could reflect a combination of interstitial edema and atelectasis with more patchy opacity towards a posterior costophrenic sulci. Some additional areas of fine subpleural reticular change are present throughout both lungs incompletely assessed on this exam and likely accentuated by imaging during exhalation  for the angiographic technique. Upper Abdomen: Multiple calcified gallstones layering within the otherwise normal gallbladder. No acute abnormalities present in the visualized portions of the upper abdomen. Musculoskeletal: No acute osseous abnormality or suspicious osseous lesion. Multilevel degenerative changes are present in the imaged portions of the spine. Additional degenerative changes in the shoulders. No worrisome chest wall lesions. Review of the MIP images confirms the above findings. IMPRESSION: 1. No evidence of acute pulmonary artery filling defects to suggest pulmonary embolism. 2. Septal thickening and vascular redistribution suggesting some interstitial edema superimposed on some atelectatic changes towards the lung  bases. Infection is less favored. 3. Additional areas of fine subpleural reticular change are present throughout both lungs incompletely assessed on this exam. Consider outpatient evaluation with HRCT. 4. Cholelithiasis without evidence of acute cholecystitis. 5. Three-vessel coronary artery atherosclerosis. 6. Aortic Atherosclerosis (ICD10-I70.0). Electronically Signed   By: Kreg Shropshire M.D.   On: 06/27/2020 01:46   DG Chest Portable 1 View  Result Date: 06/26/2020 CLINICAL DATA:  Shortness of breath EXAM: PORTABLE CHEST 1 VIEW COMPARISON:  June 11, 2020 FINDINGS: The heart size and mediastinal contours are within normal limits. Again noted are mildly increased interstitial markings seen at both lung bases, likely chronic lung changes versus atelectasis. No new airspace consolidation or pleural effusion. No acute osseous abnormality. IMPRESSION: Stable increased interstitial markings of both lung bases which could be due to atelectasis and/or chronic changes. Electronically Signed   By: Jonna Clark M.D.   On: 06/26/2020 18:19    EKG: Independently reviewed. NSR, no st changes  Assessment/Plan UTI     - admit to obs, med-sug     - follow UCx     - continue rocephin  Lethargy Generalized weakness     - likely secondary to above     - PT consult for AM  HAGMA     - etiology unknown     - fluids, rpt labs, check lactic acid  Dementia     - continue aricept  DVT prophylaxis: lovenox  Code Status: DNR confirmed with wife by phone  Family Communication: With wife by phone.  Consults called: None  Status is: Observation  The patient remains OBS appropriate and will d/c before 2 midnights.  Dispo: The patient is from: Home              Anticipated d/c is to: Home              Anticipated d/c date is: 1 day              Patient currently is not medically stable to d/c.  Teddy Spike DO [facility]  If 7PM-7AM, please contact night-coverage [email or web address]  06/27/2020,  10:55 AM

## 2020-06-27 NOTE — ED Provider Notes (Signed)
Care assumed from S. Petru[place]i  PA-C at shift change pending UA.  See her note for full H&P.    Briefly this is 86 yo male with PMH significant for dementia presenting to emergency department today with chief complaint of lethargy. Patient lives at home with his spouse who notes significant decline since recent hospital admission with at least 3 days of unable to get out of bed and barely any PO intake.  Work up by [employer] provider includes elevated d dimer 1.03 with CTA neg PE, basic labs with slightly worse cr compared to prior 28/1.52 today compared to x2 weeks ago 23/1.34. There have been multiple attempts to cath patient by nursing staff as well as overnight MD. Patient has received total of 1500 ml of IVF. Wearing condom cath without urine output. Required haldol and geodon for agitation.    Physical Exam  BP 110/64   Pulse 72   Temp (!) 96.9 F (36.1 C) (Rectal)   Resp 11   Ht 6\' 4"  (1.93 m)   Wt 76.3 kg   SpO2 98%   BMI 20.48 kg/m   Physical Exam PE: Elderly male demented in NAD. Intermittent agitation. Resting comfortably. Abdomen non tender  ED Course/Procedures     Results for orders placed or performed during the hospital encounter of 06/26/20 (from the past 24 hour(s))  CBC with Differential     Status: None   Collection Time: 06/26/20  6:46 PM  Result Value Ref Range   WBC 7.9 4.0 - 10.5 K/uL   RBC 4.86 4.22 - 5.81 MIL/uL   Hemoglobin 15.8 13.0 - 17.0 g/dL   HCT 13/08/21 39 - 52 %   MCV 96.5 80.0 - 100.0 fL   MCH 32.5 26.0 - 34.0 pg   MCHC 33.7 30.0 - 36.0 g/dL   RDW 74.0 81.4 - 48.1 %   Platelets 357 150 - 400 K/uL   nRBC 0.0 0.0 - 0.2 %   Neutrophils Relative % 73 %   Neutro Abs 5.8 1.7 - 7.7 K/uL   Lymphocytes Relative 15 %   Lymphs Abs 1.2 0.7 - 4.0 K/uL   Monocytes Relative 9 %   Monocytes Absolute 0.7 0.1 - 1.0 K/uL   Eosinophils Relative 2 %   Eosinophils Absolute 0.1 0.0 - 0.5 K/uL   Basophils Relative 1 %   Basophils Absolute 0.1 0.0 - 0.1 K/uL    Immature Granulocytes 0 %   Abs Immature Granulocytes 0.02 0.00 - 0.07 K/uL  Comprehensive metabolic panel     Status: Abnormal   Collection Time: 06/26/20  6:46 PM  Result Value Ref Range   Sodium 140 135 - 145 mmol/L   Potassium 4.8 3.5 - 5.1 mmol/L   Chloride 103 98 - 111 mmol/L   CO2 20 (L) 22 - 32 mmol/L   Glucose, Bld 91 70 - 99 mg/dL   BUN 28 (H) 8 - 23 mg/dL   Creatinine, Ser 13/08/21 (H) 0.61 - 1.24 mg/dL   Calcium 9.5 8.9 - 3.14 mg/dL   Total Protein 6.9 6.5 - 8.1 g/dL   Albumin 4.1 3.5 - 5.0 g/dL   AST 25 15 - 41 U/L   ALT 18 0 - 44 U/L   Alkaline Phosphatase 69 38 - 126 U/L   Total Bilirubin 1.7 (H) 0.3 - 1.2 mg/dL   GFR, Estimated 44 (L) >60 mL/min   Anion gap 17 (H) 5 - 15  D-dimer, quantitative (not at [facility])     Status: Abnormal  Collection Time: 06/26/20  6:46 PM  Result Value Ref Range   D-Dimer, Quant 1.03 (H) 0.00 - 0.50 ug/mL-FEU  Respiratory Panel by RT PCR (Flu A&B, Covid) - Nasopharyngeal Swab     Status: None   Collection Time: 06/26/20  8:56 PM   Specimen: Nasopharyngeal Swab  Result Value Ref Range   SARS Coronavirus 2 by RT PCR NEGATIVE NEGATIVE   Influenza A by PCR NEGATIVE NEGATIVE   Influenza B by PCR NEGATIVE NEGATIVE  Urinalysis, Routine w reflex microscopic Urine, Clean Catch     Status: Abnormal   Collection Time: 06/27/20  9:28 AM  Result Value Ref Range   Color, Urine YELLOW YELLOW   APPearance CLEAR CLEAR   Specific Gravity, Urine >1.046 (H) 1.005 - 1.030   pH 5.0 5.0 - 8.0   Glucose, UA NEGATIVE NEGATIVE mg/dL   Hgb urine dipstick LARGE (A) NEGATIVE   Bilirubin Urine NEGATIVE NEGATIVE   Ketones, ur 20 (A) NEGATIVE mg/dL   Protein, ur 209 (A) NEGATIVE mg/dL   Nitrite NEGATIVE NEGATIVE   Leukocytes,Ua MODERATE (A) NEGATIVE   RBC / HPF 6-10 0 - 5 RBC/hpf   WBC, UA >50 (H) 0 - 5 WBC/hpf   Bacteria, UA RARE (A) NONE SEEN   Squamous Epithelial / LPF 0-5 0 - 5    EKG Interpretation  Date/Time:  Monday June 26 2020 18:05:05  EST Ventricular Rate:  94 PR Interval:    QRS Duration: 94 QT Interval:  375 QTC Calculation: 469 R Axis:   31 Text Interpretation: Sinus rhythm No acute changes No significant change since last tracing Confirmed by Derwood Kaplan [phone]) on 06/26/2020 7:27:04 PM       CT ANGIOGRAPHY CHEST WITH CONTRAST    TECHNIQUE:  Multidetector CT imaging of the chest was performed using the  standard protocol during bolus administration of intravenous  contrast. Multiplanar CT image reconstructions and MIPs were  obtained to evaluate the vascular anatomy.    CONTRAST: 78mL OMNIPAQUE IOHEXOL 350 MG/ML SOLN    COMPARISON: Radiograph 06/26/2020    FINDINGS:  Cardiovascular: Satisfactory opacification the pulmonary arteries to  the segmental level. No pulmonary artery filling defects are  identified. Central pulmonary arteries are normal caliber. Normal  heart size. No pericardial effusion. Three-vessel coronary artery  atherosclerosis is noted. Suboptimal opacification of the aorta. The  aortic root is suboptimally assessed given cardiac pulsation  artifact. Atherosclerotic plaque within the normal caliber aorta. No  clear acute luminal abnormality. No periaortic stranding or  hemorrhage. Shared origin of the brachiocephalic and left common  carotid arteries. Proximal great vessels are moderately calcified  but otherwise free of acute abnormality. No major venous  abnormality.    Mediastinum/Nodes: No mediastinal fluid or gas. Normal thyroid gland  and thoracic inlet. No acute abnormality of the trachea or  esophagus. No worrisome mediastinal, hilar or axillary adenopathy.    Lungs/Pleura: There is some mild vascular redistribution with  interlobular septal thickening and some dependent areas of  ground-glass opacity which could reflect a combination of  interstitial edema and atelectasis with more patchy opacity towards  a posterior costophrenic sulci. Some additional areas of  fine  subpleural reticular change are present throughout both lungs  incompletely assessed on this exam and likely accentuated by imaging  during exhalation for the angiographic technique.    Upper Abdomen: Multiple calcified gallstones layering within the  otherwise normal gallbladder. No acute abnormalities present in the  visualized portions of the upper abdomen.  Musculoskeletal: No acute osseous abnormality or suspicious osseous  lesion. Multilevel degenerative changes are present in the imaged  portions of the spine. Additional degenerative changes in the  shoulders. No worrisome chest wall lesions.    Review of the MIP images confirms the above findings.    IMPRESSION:  1. No evidence of acute pulmonary artery filling defects to suggest  pulmonary embolism.  2. Septal thickening and vascular redistribution suggesting some  interstitial edema superimposed on some atelectatic changes towards  the lung bases. Infection is less favored.  3. Additional areas of fine subpleural reticular change are present  throughout both lungs incompletely assessed on this exam. Consider  outpatient evaluation with HRCT.  4. Cholelithiasis without evidence of acute cholecystitis.  5. Three-vessel coronary artery atherosclerosis.  6. Aortic Atherosclerosis (ICD10-I70.0).      Electronically Signed  By: Kreg Shropshire M.D.  On: 06/27/2020 01:46    PORTABLE CHEST 1 VIEW    COMPARISON: June 11, 2020    FINDINGS:  The heart size and mediastinal contours are within normal limits.  Again noted are mildly increased interstitial markings seen at both  lung bases, likely chronic lung changes versus atelectasis. No new  airspace consolidation or pleural effusion. No acute osseous  abnormality.    IMPRESSION:  Stable increased interstitial markings of both lung bases which  could be due to atelectasis and/or chronic changes.      Electronically Signed  By: Jonna Clark  M.D.  On: 06/26/2020 18:19       MDM  Patient received in sign out, please see previous provider night to include MDM up to this point.   UA suggestive of infection with moderate leukocytes, over 50 WBC.  Urine culture sent.  Patient has remote history of UTI in 2018 with Klebsiella pneumoniae only sensitive to gentamicin and imipenem.  Discussed with pharmacist to agrees with to treat with Rocephin and wait for culture results.  I called his wife with update on plan of care, she agrees with plan to admit.  Spoke with Dr. Ronaldo Miyamoto with hospitalist service who agrees to assume care of patient and bring into the hospital for further evaluation and management. Appreciate admission.   Portions of this note were generated with Scientist, clinical (histocompatibility and immunogenetics). Dictation errors may occur despite best attempts at proofreading.    Shanon Ace, PA-C 06/27/20 1055    Margarita Grizzle, MD 06/27/20 [phone]

## 2020-06-27 NOTE — ED Provider Notes (Signed)
00:15: Assumed care of patient from [place] PA-C at change of shift pending CTA, UA, and disposition.   Please see prior provider note for full H&P.  Briefly patient is a 86 year old male with a history of dementia who presented to the emergency department via EMS due to patient's wife having concern for increased lethargy.  Prior provider attempted to contact patient's wife without success.  With his history of dementia he had increased agitation requiring Haldol and subsequently Geodon.  He is pending CTA of the chest is had a brief episode of hypoxia that quickly resolved spontaneously as well as a urinalysis to check for UTI.  Plan at change of shift is to follow-up on the studies, if no significant abnormalities, ambulate patient with pulse oximetry and if reassuring discharge home, if has UTI or PE plan for admission.  Patient did have recent hospital admission with multiple concerns.   CTA: 1. No evidence of acute pulmonary artery filling defects to suggest pulmonary embolism. 2. Septal thickening and vascular redistribution suggesting some interstitial edema superimposed on some atelectatic changes towards the lung bases. Infection is less favored. 3. Additional areas of fine subpleural reticular change are present throughout both lungs incompletely assessed on this exam. Consider outpatient evaluation with HRCT. 4. Cholelithiasis without evidence of acute cholecystitis. 5. Three-vessel coronary artery atherosclerosis. 6. Aortic Atherosclerosis   Per nursing staff attempted to straight cath patient with 16 french & 16 coude unsuccessfully, patient clenching/bearing-down making this very difficult.  Bladder scan shows 150-200 cc of urine in the bladder @ that time.  Condom cath has been placed while pending urinalysis, 500 cc of fluid ordered- had received 500 cc earlier in the evening as well. He did have a wet diaper while in the ED earlier per nursing.   05:45: I called & spoke with  patient's wife via telephone- she relays patient has had significant deconditioning, she states since returning home from most recent hospitalization he has only ambulated once, at the beginning of the month, none since. He is not getting out of bed and has had poor PO intake. She is concerned about his overall health decline and what to do to care for him. Discussed continued ED evaluation at this time and will call back with updates.   Remain without UA. Catheterization attempted by supervising physician Dr. Dina Rich- met resistance. Foley bag reapplied & additional fluids ordered.   Patient care signed out to [place] at change of shift pending UA, if patient unable to provide urine by 8AM would consider urology consultation for catheterization.       Amaryllis Dyke, [place] 06/27/20 4034    Merryl Hacker, MD 06/27/20 2322

## 2020-06-27 NOTE — ED Notes (Signed)
We attempted to do a in and out cath on patient and was not unsuccessful

## 2020-06-28 DIAGNOSIS — N390 Urinary tract infection, site not specified: Secondary | ICD-10-CM | POA: Diagnosis not present

## 2020-06-28 LAB — CBC
HCT: 41.2 % (ref 39.0–52.0)
Hemoglobin: 13.8 g/dL (ref 13.0–17.0)
MCH: 32.2 pg (ref 26.0–34.0)
MCHC: 33.5 g/dL (ref 30.0–36.0)
MCV: 96.3 fL (ref 80.0–100.0)
Platelets: 262 10*3/uL (ref 150–400)
RBC: 4.28 MIL/uL (ref 4.22–5.81)
RDW: 11.9 % (ref 11.5–15.5)
WBC: 6.3 10*3/uL (ref 4.0–10.5)
nRBC: 0 % (ref 0.0–0.2)

## 2020-06-28 LAB — COMPREHENSIVE METABOLIC PANEL
ALT: 17 U/L (ref 0–44)
AST: 27 U/L (ref 15–41)
Albumin: 3.5 g/dL (ref 3.5–5.0)
Alkaline Phosphatase: 58 U/L (ref 38–126)
Anion gap: 12 (ref 5–15)
BUN: 20 mg/dL (ref 8–23)
CO2: 18 mmol/L — ABNORMAL LOW (ref 22–32)
Calcium: 8.7 mg/dL — ABNORMAL LOW (ref 8.9–10.3)
Chloride: 111 mmol/L (ref 98–111)
Creatinine, Ser: 1.24 mg/dL (ref 0.61–1.24)
GFR, Estimated: 56 mL/min — ABNORMAL LOW (ref >60)
Glucose, Bld: 84 mg/dL (ref 70–99)
Potassium: 3.9 mmol/L (ref 3.5–5.1)
Sodium: 141 mmol/L (ref 135–145)
Total Bilirubin: 1.2 mg/dL (ref 0.3–1.2)
Total Protein: 6.5 g/dL (ref 6.5–8.1)

## 2020-06-28 LAB — URINE CULTURE: Culture: NO GROWTH

## 2020-06-28 MED ORDER — ENOXAPARIN SODIUM 40 MG/0.4ML ~~LOC~~ SOLN
40.0000 mg | SUBCUTANEOUS | Status: DC
  Administered 2020-06-28 – 2020-07-03 (×6): 40 mg via SUBCUTANEOUS
  Filled 2020-06-28 (×7): qty 0.4

## 2020-06-28 NOTE — Progress Notes (Signed)
PROGRESS NOTE    Jacob Rich  JXB:[id number] DOB: 1931/07/13 DOA: 06/26/2020 PCP: Renaye Rakers, MD    Brief Narrative:  86 year old gentleman with history of dementia, recent history of seizure bronchitis.  Recently admitted to a skilled nursing facility went back home.  He was found to be sleepy all the time, difficulty walking and progressive debility so brought to ER.  In the emergency room, work-up was largely unremarkable.  UA abnormal, however he has similar urinalysis in the past and has frequent UTI.   Assessment & Plan:   Active Problems:   UTI (urinary tract infection)  Acute UTI present on admission: With history of frequent UTIs. No evidence of GU abnormalities on CT scan 10/24 No evidence of post void residual Treated with Rocephin, will continue until final cultures.  Last urine culture with multiple species.  Progressive debility and dementia: With advanced physical deconditioning.  Will benefit with PT OT. Patient will need more supervised living, probably his dementia is progressing and he may need long-term placement. This time he will benefit with skilled nursing facility placement, will continue to work with therapies. Patient on donepezil that he will continue.  Acute kidney injury: Probably due to #1.  Treated with IV fluids and improved.  DVT prophylaxis: enoxaparin (LOVENOX) injection 40 mg Start: 06/28/20 1000   Code Status: DNR Family Communication: Wife at the bedside Disposition Plan: Status is: Observation  The patient will require care spanning > 2 midnights and should be moved to inpatient because: Inpatient level of care appropriate due to severity of illness  Dispo: The patient is from: Home              Anticipated d/c is to: SNF              Anticipated d/c date is: 2 days              Patient currently is not medically stable to d/c.  Patient admitted with progressive debility, lethargic in the context of UTI.  He lives at home with his  wife and unable to carry out his ADLs.  He is waiting to go to a skilled nursing facility when available.  Unsafe to discharge home.       Consultants:   None  Procedures:   None  Antimicrobials:   Rocephin, 11/9---   Subjective: Patient seen and examined.  Pleasantly confused.  No complaints at such.  Afebrile. He is only able to tell us that he lives in [place].  Objective: Vitals:   06/27/20 2143 06/28/20 0618 06/28/20 0933 06/28/20 1327  BP: (!) 145/76 136/62 133/65 (!) 145/71  Pulse: 83 75 70 81  Resp: 16 17 16  (!) 22  Temp: 97.8 F (36.6 C) 98.7 F (37.1 C) (!) 97.5 F (36.4 C) 97.6 F (36.4 C)  TempSrc: Oral Oral Oral Oral  SpO2: 98% 98% 98% 100%  Weight:      Height:        Intake/Output Summary (Last 24 hours) at 06/28/2020 1429 Last data filed at 06/28/2020 1400 Gross per 24 hour  Intake 1180 ml  Output 150 ml  Net 1030 ml   Filed Weights   06/26/20 1736  Weight: 76.3 kg    Examination:  General exam: Appears calm and comfortable  Pleasantly confused.  He was trying to eat lunch. Respiratory system: Clear to auscultation. Respiratory effort normal.  No added sounds. Cardiovascular system: S1 & S2 heard, RRR.  Gastrointestinal system: Soft and nontender.  Central nervous system: Alert and oriented x1.  No focal neurological deficits.  Generalized weakness. Psychiatry: Judgement and insight appear compromised.  Mood & affect flat.    Data Reviewed: I have personally reviewed following labs and imaging studies  CBC: Recent Labs  Lab 06/26/20 1846 06/27/20 1115 06/28/20 0516  WBC 7.9 8.7 6.3  NEUTROABS 5.8 7.3  --   HGB 15.8 14.8 13.8  HCT 46.9 44.1 41.2  MCV 96.5 96.9 96.3  PLT 357 278 262   Basic Metabolic Panel: Recent Labs  Lab 06/26/20 1846 06/27/20 1115 06/27/20 1116 06/28/20 0516  NA 140  --  143 141  K 4.8  --  4.5 3.9  CL 103  --  109 111  CO2 20*  --  19* 18*  GLUCOSE 91  --  87 84  BUN 28*  --  24* 20    CREATININE 1.52*  --  1.49* 1.24  CALCIUM 9.5  --  8.8* 8.7*  MG  --  2.3  --   --   PHOS  --   --  3.2  --    GFR: Estimated Creatinine Clearance: 45.3 mL/min (by C-G formula based on SCr of 1.24 mg/dL). Liver Function Tests: Recent Labs  Lab 06/26/20 1846 06/27/20 1116 06/28/20 0516  AST 25  --  27  ALT 18  --  17  ALKPHOS 69  --  58  BILITOT 1.7*  --  1.2  PROT 6.9  --  6.5  ALBUMIN 4.1 3.3* 3.5   No results for input(s): LIPASE, AMYLASE in the last 168 hours. No results for input(s): AMMONIA in the last 168 hours. Coagulation Profile: No results for input(s): INR, PROTIME in the last 168 hours. Cardiac Enzymes: No results for input(s): CKTOTAL, CKMB, CKMBINDEX, TROPONINI in the last 168 hours. BNP (last 3 results) No results for input(s): PROBNP in the last 8760 hours. HbA1C: No results for input(s): HGBA1C in the last 72 hours. CBG: No results for input(s): GLUCAP in the last 168 hours. Lipid Profile: No results for input(s): CHOL, HDL, LDLCALC, TRIG, CHOLHDL, LDLDIRECT in the last 72 hours. Thyroid Function Tests: No results for input(s): TSH, T4TOTAL, FREET4, T3FREE, THYROIDAB in the last 72 hours. Anemia Panel: No results for input(s): VITAMINB12, FOLATE, FERRITIN, TIBC, IRON, RETICCTPCT in the last 72 hours. Sepsis Labs: Recent Labs  Lab 06/27/20 1101 06/27/20 1522  LATICACIDVEN 2.2* 1.2    Recent Results (from the past 240 hour(s))  Respiratory Panel by RT PCR (Flu A&B, Covid) - Nasopharyngeal Swab     Status: None   Collection Time: 06/26/20  8:56 PM   Specimen: Nasopharyngeal Swab  Result Value Ref Range Status   SARS Coronavirus 2 by RT PCR NEGATIVE NEGATIVE Final    Comment: (NOTE) SARS-CoV-2 target nucleic acids are NOT DETECTED.  The SARS-CoV-2 RNA is generally detectable in upper respiratoy specimens during the acute phase of infection. The lowest concentration of SARS-CoV-2 viral copies this assay can detect is 131 copies/mL. A negative  result does not preclude SARS-Cov-2 infection and should not be used as the sole basis for treatment or other patient management decisions. A negative result may occur with  improper specimen collection/handling, submission of specimen other than nasopharyngeal swab, presence of viral mutation(s) within the areas targeted by this assay, and inadequate number of viral copies (<131 copies/mL). A negative result must be combined with clinical observations, patient history, and epidemiological information. The expected result is Negative.  Fact Sheet for Patients:  https://www.moore.com/  Fact Sheet for Healthcare Providers:  https://www.young.biz/  This test is no t yet approved or cleared by the [place] FDA and  has been authorized for detection and/or diagnosis of SARS-CoV-2 by FDA under an Emergency Use Authorization (EUA). This EUA will remain  in effect (meaning this test can be used) for the duration of the COVID-19 declaration under Section 564(b)(1) of the Act, 21 U.S.C. section 360bbb-3(b)(1), unless the authorization is terminated or revoked sooner.     Influenza A by PCR NEGATIVE NEGATIVE Final   Influenza B by PCR NEGATIVE NEGATIVE Final    Comment: (NOTE) The Xpert Xpress SARS-CoV-2/FLU/RSV assay is intended as an aid in  the diagnosis of influenza from Nasopharyngeal swab specimens and  should not be used as a sole basis for treatment. Nasal washings and  aspirates are unacceptable for Xpert Xpress SARS-CoV-2/FLU/RSV  testing.  Fact Sheet for Patients: https://www.moore.com/  Fact Sheet for Healthcare Providers: https://www.young.biz/  This test is not yet approved or cleared by the [place] FDA and  has been authorized for detection and/or diagnosis of SARS-CoV-2 by  FDA under an Emergency Use Authorization (EUA). This EUA will remain  in effect (meaning this test can be used)  for the duration of the  Covid-19 declaration under Section 564(b)(1) of the Act, 21  U.S.C. section 360bbb-3(b)(1), unless the authorization is  terminated or revoked. Performed at [facility], [address] [place], [place] [address]   Urine culture     Status: None   Collection Time: 06/27/20  9:28 AM   Specimen: Urine, Catheterized  Result Value Ref Range Status   Specimen Description   Final    URINE, CATHETERIZED Performed at [facility], [address], [place], [place] [address]    Special Requests   Final    NONE Performed at [facility], [address] [place], [place] [address]    Culture   Final    NO GROWTH Performed at [facility], [address], [place], [place] [address]    Report Status 06/28/2020 FINAL  Final         Radiology Studies: CT Angio Chest PE W and/or Wo Contrast  Result Date: 06/27/2020 CLINICAL DATA:  Increasingly lethargic for 3 days EXAM: CT ANGIOGRAPHY CHEST WITH CONTRAST TECHNIQUE: Multidetector CT imaging of the chest was performed using the standard protocol during bolus administration of intravenous contrast. Multiplanar CT image reconstructions and MIPs were obtained to evaluate the vascular anatomy. CONTRAST:  58mL OMNIPAQUE IOHEXOL 350 MG/ML SOLN COMPARISON:  Radiograph 06/26/2020 FINDINGS: Cardiovascular: Satisfactory opacification the pulmonary arteries to the segmental level. No pulmonary artery filling defects are identified. Central pulmonary arteries are normal caliber. Normal heart size. No pericardial effusion. Three-vessel coronary artery atherosclerosis is noted. Suboptimal opacification of the aorta. The aortic root is suboptimally assessed given cardiac pulsation artifact. Atherosclerotic plaque within the normal caliber aorta. No clear acute luminal abnormality. No periaortic stranding or hemorrhage. Shared origin of the brachiocephalic and left common carotid  arteries. Proximal great vessels are moderately calcified but otherwise free of acute abnormality. No major venous abnormality. Mediastinum/Nodes: No mediastinal fluid or gas. Normal thyroid gland and thoracic inlet. No acute abnormality of the trachea or esophagus. No worrisome mediastinal, hilar or axillary adenopathy. Lungs/Pleura: There is some mild vascular redistribution with interlobular septal thickening and some dependent areas of ground-glass opacity which could reflect a combination of interstitial edema and atelectasis with more patchy opacity towards  a posterior costophrenic sulci. Some additional areas of fine subpleural reticular change are present throughout both lungs incompletely assessed on this exam and likely accentuated by imaging during exhalation for the angiographic technique. Upper Abdomen: Multiple calcified gallstones layering within the otherwise normal gallbladder. No acute abnormalities present in the visualized portions of the upper abdomen. Musculoskeletal: No acute osseous abnormality or suspicious osseous lesion. Multilevel degenerative changes are present in the imaged portions of the spine. Additional degenerative changes in the shoulders. No worrisome chest wall lesions. Review of the MIP images confirms the above findings. IMPRESSION: 1. No evidence of acute pulmonary artery filling defects to suggest pulmonary embolism. 2. Septal thickening and vascular redistribution suggesting some interstitial edema superimposed on some atelectatic changes towards the lung bases. Infection is less favored. 3. Additional areas of fine subpleural reticular change are present throughout both lungs incompletely assessed on this exam. Consider outpatient evaluation with HRCT. 4. Cholelithiasis without evidence of acute cholecystitis. 5. Three-vessel coronary artery atherosclerosis. 6. Aortic Atherosclerosis (ICD10-I70.0). Electronically Signed   By: Kreg ShropshirePrice  DeHay M.D.   On: 06/27/2020 01:46    DG Chest Portable 1 View  Result Date: 06/26/2020 CLINICAL DATA:  Shortness of breath EXAM: PORTABLE CHEST 1 VIEW COMPARISON:  June 11, 2020 FINDINGS: The heart size and mediastinal contours are within normal limits. Again noted are mildly increased interstitial markings seen at both lung bases, likely chronic lung changes versus atelectasis. No new airspace consolidation or pleural effusion. No acute osseous abnormality. IMPRESSION: Stable increased interstitial markings of both lung bases which could be due to atelectasis and/or chronic changes. Electronically Signed   By: Jonna ClarkBindu  Avutu M.D.   On: 06/26/2020 18:19        Scheduled Meds: . donepezil  10 mg Oral QHS  . enoxaparin (LOVENOX) injection  40 mg Subcutaneous Q24H   Continuous Infusions: . cefTRIAXone (ROCEPHIN)  IV       LOS: 0 days    Time spent: 30 minutes    Dorcas CarrowKuber Nichola Cieslinski, MD [facility] Pager [phone]

## 2020-06-28 NOTE — Evaluation (Signed)
Physical Therapy Evaluation Patient Details Name: Jacob Rich MRN: [id number] DOB: 11/09/1930 Today's Date: 06/28/2020   History of Present Illness  86 y.o. male with medical history significant of dementia. Presenting with 5 days of lethargy and poor appetite. Hx is from his wife. He was recently in the hospital 10/24-10/26/21 for seizures and bronchitis.  He was fine for a couple of days, but on Thursday he started becoming more lethargic, decreased p.o. intake, weakness. Dx of UTI.  Clinical Impression  Pt admitted with above diagnosis. Moderate assist for bed mobility and to transfer bed to chair. Per wife, pt is ambulatory at baseline but has mostly been sleeping in bed since DC from hospital 06/13/20. ST-SNF recommended. Pt currently with functional limitations due to the deficits listed below (see PT Problem List). Pt will benefit from skilled PT to increase their independence and safety with mobility to allow discharge to the venue listed below.       Follow Up Recommendations Supervision/Assistance - 24 hour;Supervision for mobility/OOB;SNF (SNF if family unable to provide 24* assist)    Equipment Recommendations  Rolling walker with 5" wheels    Recommendations for Other Services       Precautions / Restrictions Precautions Precautions: Fall Restrictions Weight Bearing Restrictions: No      Mobility  Bed Mobility Overal bed mobility: Needs Assistance Bed Mobility: Supine to Sit     Supine to sit: Mod assist;HOB elevated     General bed mobility comments: use of rail, A at trunk    Transfers Overall transfer level: Needs assistance Equipment used: Rolling walker (2 wheeled) Transfers: Sit to/from [employer] Sit to Stand: Mod assist;From elevated surface Stand pivot transfers: Min assist       General transfer comment: assist to rise/steady, increased time, assist to control descent  Ambulation/Gait             General Gait Details:  NT -pt fatigued with transfer  Stairs            Wheelchair Mobility    Modified Rankin (Stroke Patients Only)       Balance Overall balance assessment: Needs assistance Sitting-balance support: Feet supported;No upper extremity supported Sitting balance-Leahy Scale: Fair Sitting balance - Comments: posterior lean with feet/UEs unsupported, fair with feet supported   Standing balance support: Bilateral upper extremity supported Standing balance-Leahy Scale: Poor Standing balance comment: relies on BUE support, posterior lean initially                             Pertinent Vitals/Pain Pain Assessment: No/denies pain    Home Living Family/patient expects to be discharged to:: Private residence Living Arrangements: Spouse/significant other Available Help at Discharge: Family;Available 24 hours/day Type of Home: Mobile home Home Access: Stairs to enter Entrance Stairs-Rails: Left Entrance Stairs-Number of Steps: 5 Home Layout: One level Home Equipment: Grab bars - toilet;Tub bench Additional Comments: All information via wife over phone from OT eval    Prior Function Level of Independence: Independent         Comments: Wife reports that when he has had PT in the past they have not recommended any sort of AD for ambulation due to him not be coordinated enough to use.     Hand Dominance   Dominant Hand: Right    Extremity/Trunk Assessment   Upper Extremity Assessment Upper Extremity Assessment: Defer to OT evaluation    Lower Extremity Assessment Lower Extremity Assessment: Generalized weakness (-  4/5 B knee extension)    Cervical / Trunk Assessment Cervical / Trunk Assessment: Kyphotic  Communication   Communication: No difficulties  Cognition Arousal/Alertness: Lethargic (slow to arouse) Behavior During Therapy: WFL for tasks assessed/performed Overall Cognitive Status: History of cognitive impairments - at baseline                                  General Comments: oriented to self only, does not know location nor year. After being oriented to location and asked again later his location, he recalle that he's in a hospital. He can follow commands.      General Comments      Exercises     Assessment/Plan    PT Assessment Patient needs continued PT services  PT Problem List Decreased balance;Decreased knowledge of use of DME;Decreased mobility;Decreased cognition;Decreased activity tolerance       PT Treatment Interventions Gait training;Functional mobility training;Therapeutic exercise;Therapeutic activities;Patient/family education    PT Goals (Current goals can be found in the Care Plan section)  Acute Rehab PT Goals Patient Stated Goal: likes to read the newspaper PT Goal Formulation: With family Time For Goal Achievement: 07/12/20 Potential to Achieve Goals: Fair    Frequency Min 2X/week   Barriers to discharge        Co-evaluation               AM-PAC PT "6 Clicks" Mobility  Outcome Measure Help needed turning from your back to your side while in a flat bed without using bedrails?: A Lot Help needed moving from lying on your back to sitting on the side of a flat bed without using bedrails?: A Lot Help needed moving to and from a bed to a chair (including a wheelchair)?: A Lot Help needed standing up from a chair using your arms (e.g., wheelchair or bedside chair)?: A Lot Help needed to walk in hospital room?: Total Help needed climbing 3-5 steps with a railing? : Total 6 Click Score: 10    End of Session Equipment Utilized During Treatment: Gait belt Activity Tolerance: Patient tolerated treatment well Patient left: in chair;with call bell/phone within reach;with chair alarm set;with family/visitor present Nurse Communication: Mobility status PT Visit Diagnosis: Difficulty in walking, not elsewhere classified (R26.2)    Time: 4174-0814 PT Time Calculation (min) (ACUTE  ONLY): 26 min   Charges:   PT Evaluation $PT Eval Moderate Complexity: 1 Mod PT Treatments $Therapeutic Activity: 8-22 mins       Ralene Bathe Kistler PT 06/28/2020  Acute Rehabilitation Services Pager [phone] Office [phone]

## 2020-06-28 NOTE — TOC Initial Note (Signed)
Transition of Care [facility]) - Initial/Assessment Note    Patient Details  Name: Lofton Leon MRN: [id number] Date of Birth: 06-Aug-1931  Transition of [facility]) CM/SW Contact:    Ida Rogue, LCSW Phone Number: 06/28/2020, 3:16 PM    Clinical Narrative:  Patient seen in follow up to PT recommendation of SNF/24 hr supervision.  Mr Baratta lives here in [place] with his wife who is at bedside.  She confirms that they are needing short term rehab to help him work towards previous baseline.  He is currently getting [facility] PT services through [facility]-was just released from hospital 2 weeks ago.  Also has rolling walker.  Explained bed search process to wife and patient.  No preferences expressed.  Bed search initiated. TOC will continue to follow during the course of hospitalization.                    Expected Discharge Plan: Skilled Nursing Facility Barriers to Discharge: SNF Pending bed offer (as well as insurance authorization)   Patient Goals and CMS Choice     Choice offered to / list presented to : Patient, Spouse  Expected Discharge Plan and Services Expected Discharge Plan: Skilled Nursing Facility   Discharge Planning Services: CM Consult Post Acute Care Choice: Skilled Nursing Facility Living arrangements for the past 2 months: Single Family Home Expected Discharge Date:  (unknown)                                    Prior Living Arrangements/Services Living arrangements for the past 2 months: Single Family Home Lives with:: Spouse Patient language and need for interpreter reviewed:: Yes        Need for Family Participation in Patient Care: Yes (Comment) Care giver support system in place?: Yes (comment) Current home services: DME, Home PT Criminal Activity/Legal Involvement Pertinent to Current Situation/Hospitalization: No - Comment as needed  Activities of Daily Living Home Assistive Devices/Equipment: Grab bars around toilet, Walker (specify type), Other  (Comment) (tub/shower unit, front wheeled walker, standard toilet) ADL Screening (condition at time of admission) Patient's cognitive ability adequate to safely complete daily activities?: No (patient has dementia but is very lethargic) Is the patient deaf or have difficulty hearing?: Yes Does the patient have difficulty seeing, even when wearing glasses/contacts?: No Does the patient have difficulty concentrating, remembering, or making decisions?: Yes Patient able to express need for assistance with ADLs?: No Does the patient have difficulty dressing or bathing?: Yes Independently performs ADLs?: No Communication: Independent Dressing (OT): Dependent Is this a change from baseline?: Change from baseline, expected to last >3 days Grooming: Dependent Is this a change from baseline?: Change from baseline, expected to last >3 days Feeding: Dependent Is this a change from baseline?: Change from baseline, expected to last >3 days Bathing: Dependent Is this a change from baseline?: Change from baseline, expected to last >3 days Toileting: Dependent Is this a change from baseline?: Change from baseline, expected to last >3days In/Out Bed: Dependent Is this a change from baseline?: Change from baseline, expected to last >3 days Walks in Home: Dependent Is this a change from baseline?: Change from baseline, expected to last >3 days Does the patient have difficulty walking or climbing stairs?: Yes (secondary to weakness and lethargy) Weakness of Legs: Both Weakness of Arms/Hands: Both  Permission Sought/Granted Permission sought to share information with : Family Supports Permission granted to share information with :  Yes, Verbal Permission Granted  Share Information with NAME: Garmon,[name] (Spouse) [phone]           Emotional Assessment Appearance:: Appears stated age Attitude/Demeanor/Rapport: Engaged Affect (typically observed): Appropriate Orientation: : Oriented to Self,  Oriented to Place Alcohol / Substance Use: Not Applicable Psych Involvement: No (comment)  Admission diagnosis:  UTI (urinary tract infection) [N39.0] Acute cystitis with hematuria [N30.01] Patient Active Problem List   Diagnosis Date Noted  . Convulsion (HCC) 06/12/2020  . Intractable nausea and vomiting 06/12/2020  . Acute encephalopathy 06/11/2020  . Nausea & vomiting 06/11/2020  . Complicated UTI (urinary tract infection) 01/05/2020  . Dementia (HCC) 01/05/2020  . UTI (urinary tract infection) 06/17/2017  . Traumatic closed displaced fracture of shoulder with anterior dislocation with routine healing 06/10/2016  . Dementia with behavioral disturbance (HCC) 04/16/2016   PCP:  Renaye Rakers, MD Pharmacy:   [facility] [phone] - [place], [place] - [address] [place] [address] Phone: [phone] Fax: [phone]     Social Determinants of Health (SDOH) Interventions    Readmission Risk Interventions No flowsheet data found.

## 2020-06-28 NOTE — [place] FL2 (Signed)
[place] MEDICAID FL2 LEVEL OF CARE SCREENING TOOL     IDENTIFICATION  Patient Name: Jacob Rich Birthdate: 09/04/1930 Sex: male Admission Date (Current Location): 06/26/2020  [facility] and [place] Number:  Producer, television/film/video and Address:  [facility],  [address], [place] [address]      Provider Number: [id number]  Attending Physician Name and Address:  Dorcas Carrow, MD  Relative Name and Phone Number:  Wolff,[name] (Spouse) [phone]    Current Level of Care: Hospital Recommended Level of Care: Skilled Nursing Facility Prior Approval Number:    Date Approved/Denied:   PASRR Number: [id number]  Discharge Plan: SNF    Current Diagnoses: Patient Active Problem List   Diagnosis Date Noted  . Convulsion (HCC) 06/12/2020  . Intractable nausea and vomiting 06/12/2020  . Acute encephalopathy 06/11/2020  . Nausea & vomiting 06/11/2020  . Complicated UTI (urinary tract infection) 01/05/2020  . Dementia (HCC) 01/05/2020  . UTI (urinary tract infection) 06/17/2017  . Traumatic closed displaced fracture of shoulder with anterior dislocation with routine healing 06/10/2016  . Dementia with behavioral disturbance (HCC) 04/16/2016    Orientation RESPIRATION BLADDER Height & Weight     Self  Normal External catheter Weight: 76.3 kg Height:  6\' 4"  (193 cm)  BEHAVIORAL SYMPTOMS/MOOD NEUROLOGICAL BOWEL NUTRITION STATUS   (none)  (none) Continent Diet (see d/c summary)  AMBULATORY STATUS COMMUNICATION OF NEEDS Skin   Extensive Assist Verbally Normal                       Personal Care Assistance Level of Assistance  Bathing, Feeding, Dressing Bathing Assistance: Maximum assistance Feeding assistance: Independent Dressing Assistance: Limited assistance     Functional Limitations Info  Sight, Hearing, Speech Sight Info: Adequate Hearing Info: Adequate Speech Info: Adequate    SPECIAL CARE FACTORS FREQUENCY  PT (By licensed PT)     PT  Frequency: 5X/W              Contractures Contractures Info: Not present    Additional Factors Info  Code Status, Allergies Code Status Info: DNR Allergies Info: NKA           Current Medications (06/28/2020):  This is the current hospital active medication list Current Facility-Administered Medications  Medication Dose Route Frequency Provider Last Rate Last Admin  . acetaminophen (TYLENOL) tablet 650 mg  650 mg Oral Q6H PRN 13/05/2020, Tyrone A, DO       Or  . acetaminophen (TYLENOL) suppository 650 mg  650 mg Rectal Q6H PRN Ronaldo Miyamoto, Tyrone A, DO      . cefTRIAXone (ROCEPHIN) 1 g in sodium chloride 0.9 % 100 mL IVPB  1 g Intravenous Q24H Kyle, Tyrone A, DO 200 mL/hr at 06/28/20 1000 1 g at 06/28/20 1000  . donepezil (ARICEPT) tablet 10 mg  10 mg Oral QHS Kyle, Tyrone A, DO   10 mg at 06/27/20 2214  . enoxaparin (LOVENOX) injection 40 mg  40 mg Subcutaneous Q24H Kyle, Tyrone A, DO   40 mg at 06/28/20 1157  . haloperidol (HALDOL) tablet 5 mg  5 mg Oral Q8H PRN 13/10/21, Tyrone A, DO       Or  . haloperidol lactate (HALDOL) injection 5 mg  5 mg Intramuscular Q8H PRN Ronaldo Miyamoto, Tyrone A, DO   5 mg at 06/27/20 1411  . ondansetron (ZOFRAN) tablet 4 mg  4 mg Oral Q6H PRN 13/09/21, Tyrone A, DO       Or  .  ondansetron (ZOFRAN) injection 4 mg  4 mg Intravenous Q6H PRN Margie Ege A, DO         Discharge Medications: Please see discharge summary for a list of discharge medications.  Relevant Imaging Results:  Relevant Lab Results:   Additional Information [phone] [place], [place]

## 2020-06-29 DIAGNOSIS — Z79899 Other long term (current) drug therapy: Secondary | ICD-10-CM | POA: Diagnosis not present

## 2020-06-29 DIAGNOSIS — Z8614 Personal history of Methicillin resistant Staphylococcus aureus infection: Secondary | ICD-10-CM | POA: Diagnosis not present

## 2020-06-29 DIAGNOSIS — Z87440 Personal history of urinary (tract) infections: Secondary | ICD-10-CM | POA: Diagnosis not present

## 2020-06-29 DIAGNOSIS — N390 Urinary tract infection, site not specified: Secondary | ICD-10-CM | POA: Diagnosis present

## 2020-06-29 DIAGNOSIS — G9340 Encephalopathy, unspecified: Secondary | ICD-10-CM | POA: Diagnosis present

## 2020-06-29 DIAGNOSIS — R569 Unspecified convulsions: Secondary | ICD-10-CM | POA: Diagnosis present

## 2020-06-29 DIAGNOSIS — E872 Acidosis: Secondary | ICD-10-CM | POA: Diagnosis present

## 2020-06-29 DIAGNOSIS — R5381 Other malaise: Secondary | ICD-10-CM | POA: Diagnosis present

## 2020-06-29 DIAGNOSIS — R5383 Other fatigue: Secondary | ICD-10-CM | POA: Diagnosis present

## 2020-06-29 DIAGNOSIS — Z66 Do not resuscitate: Secondary | ICD-10-CM | POA: Diagnosis present

## 2020-06-29 DIAGNOSIS — Z20822 Contact with and (suspected) exposure to covid-19: Secondary | ICD-10-CM | POA: Diagnosis present

## 2020-06-29 DIAGNOSIS — N179 Acute kidney failure, unspecified: Secondary | ICD-10-CM | POA: Diagnosis present

## 2020-06-29 DIAGNOSIS — F0390 Unspecified dementia without behavioral disturbance: Secondary | ICD-10-CM | POA: Diagnosis present

## 2020-06-29 NOTE — TOC Progression Note (Signed)
Transition of Care [facility]) - Progression Note    Patient Details  Name: Jacob Rich MRN: [id number] Date of Birth: 24-Mar-1931  Transition of Care [facility]) CM/SW Contact  Ida Rogue, [place] Phone Number: 06/29/2020, 12:32 PM  Clinical Narrative:  Spoke to patient and wife about bed offers, wife chooses [place].  Spoke with Aram Beecham in admissions who will start insurance authorization.  MD updated. TOC will continue to follow during the course of hospitalization.      Expected Discharge Plan: Skilled Nursing Facility Barriers to Discharge: SNF Pending bed offer (as well as insurance authorization)  Expected Discharge Plan and Services Expected Discharge Plan: Skilled Nursing Facility   Discharge Planning Services: CM Consult Post Acute Care Choice: Skilled Nursing Facility Living arrangements for the past 2 months: Single Family Home Expected Discharge Date:  (unknown)                                     Social Determinants of Health (SDOH) Interventions    Readmission Risk Interventions No flowsheet data found.

## 2020-06-29 NOTE — Progress Notes (Signed)
PROGRESS NOTE    Jacob Rich  VEH:[id number] DOB: [birth year] DOA: 06/26/2020 PCP: Renaye Rakers, MD    Brief Narrative:  86 year old gentleman with history of dementia, recent history of seizure, bronchitis.  Recently admitted to a skilled nursing facility went back home.  He was found to be sleepy all the time, difficulty walking and progressive debility so brought to ER.  In the emergency room, work-up was largely unremarkable.  UA abnormal, however he has similar urinalysis in the past and has frequent UTI.   Assessment & Plan:   Active Problems:   UTI (urinary tract infection)   Encephalopathy  Acute UTI presumed on admission: Ruled out. No positive cultures yet. No evidence of GU abnormalities on CT scan 10/24 No evidence of post void residual Treated with Rocephin, received 2 doses. Discontinued.  Progressive debility and dementia: With advanced physical deconditioning.  Will benefit with PT OT. Patient will need more supervised living, probably his dementia is progressing and he may need long-term placement. This time he will benefit with skilled nursing facility placement, will continue to work with therapies. Patient on donepezil that he will continue.  Acute kidney injury: Probably due to #1.  Treated with IV fluids and improved. Discontinue. Encourage oral intake.  DVT prophylaxis: enoxaparin (LOVENOX) injection 40 mg Start: 06/28/20 1000   Code Status: DNR Family Communication: Wife at the bedside 11/10. Disposition Plan: Status is: Observation  The patient will require care spanning > 2 midnights and should be moved to inpatient because: Inpatient level of care appropriate due to severity of illness  Dispo: The patient is from: Home              Anticipated d/c is to: SNF              Anticipated d/c date is: When bed available.              Patient currently is medically stable to transfer to skilled level of care.  Patient admitted with progressive debility,  lethargic in the context of UTI.  He lives at home with his wife and unable to carry out his ADLs.  He is waiting to go to a skilled nursing facility when available.  Unsafe to discharge home.       Consultants:   None  Procedures:   None  Antimicrobials:   Rocephin, 11/9---   Subjective: Seen and examined. More awake today. He was eating his breakfast and was able to eat all his food. Pleasant but confused.  Objective: Vitals:   06/28/20 1327 06/28/20 2108 06/29/20 0500 06/29/20 1403  BP: (!) 145/71 (!) 127/55 131/71 131/68  Pulse: 81 85 70 90  Resp: (!) 22 16 17 17   Temp: 97.6 F (36.4 C) 98 F (36.7 C) 98.9 F (37.2 C) 97.7 F (36.5 C)  TempSrc: Oral Oral  Oral  SpO2: 100% 97% 99% 99%  Weight:      Height:        Intake/Output Summary (Last 24 hours) at 06/29/2020 1442 Last data filed at 06/29/2020 0600 Gross per 24 hour  Intake 220 ml  Output 0 ml  Net 220 ml   Filed Weights   06/26/20 1736  Weight: 76.3 kg    Examination:  General exam: Appears calm and comfortable  Pleasant. Confused. Alert oriented x1. Respiratory system: Clear to auscultation. Respiratory effort normal.  No added sounds. Cardiovascular system: S1 & S2 heard, RRR.  Gastrointestinal system: Soft and nontender.   Central nervous system: Alert  and oriented x1.  No focal neurological deficits.  Generalized weakness. Psychiatry: Judgement and insight appear compromised.  Mood & affect flat.    Data Reviewed: I have personally reviewed following labs and imaging studies  CBC: Recent Labs  Lab 06/26/20 1846 06/27/20 1115 06/28/20 0516  WBC 7.9 8.7 6.3  NEUTROABS 5.8 7.3  --   HGB 15.8 14.8 13.8  HCT 46.9 44.1 41.2  MCV 96.5 96.9 96.3  PLT 357 278 262   Basic Metabolic Panel: Recent Labs  Lab 06/26/20 1846 06/27/20 1115 06/27/20 1116 06/28/20 0516  NA 140  --  143 141  K 4.8  --  4.5 3.9  CL 103  --  109 111  CO2 20*  --  19* 18*  GLUCOSE 91  --  87 84  BUN 28*   --  24* 20  CREATININE 1.52*  --  1.49* 1.24  CALCIUM 9.5  --  8.8* 8.7*  MG  --  2.3  --   --   PHOS  --   --  3.2  --    GFR: Estimated Creatinine Clearance: 45.3 mL/min (by C-G formula based on SCr of 1.24 mg/dL). Liver Function Tests: Recent Labs  Lab 06/26/20 1846 06/27/20 1116 06/28/20 0516  AST 25  --  27  ALT 18  --  17  ALKPHOS 69  --  58  BILITOT 1.7*  --  1.2  PROT 6.9  --  6.5  ALBUMIN 4.1 3.3* 3.5   No results for input(s): LIPASE, AMYLASE in the last 168 hours. No results for input(s): AMMONIA in the last 168 hours. Coagulation Profile: No results for input(s): INR, PROTIME in the last 168 hours. Cardiac Enzymes: No results for input(s): CKTOTAL, CKMB, CKMBINDEX, TROPONINI in the last 168 hours. BNP (last 3 results) No results for input(s): PROBNP in the last 8760 hours. HbA1C: No results for input(s): HGBA1C in the last 72 hours. CBG: No results for input(s): GLUCAP in the last 168 hours. Lipid Profile: No results for input(s): CHOL, HDL, LDLCALC, TRIG, CHOLHDL, LDLDIRECT in the last 72 hours. Thyroid Function Tests: No results for input(s): TSH, T4TOTAL, FREET4, T3FREE, THYROIDAB in the last 72 hours. Anemia Panel: No results for input(s): VITAMINB12, FOLATE, FERRITIN, TIBC, IRON, RETICCTPCT in the last 72 hours. Sepsis Labs: Recent Labs  Lab 06/27/20 1101 06/27/20 1522  LATICACIDVEN 2.2* 1.2    Recent Results (from the past 240 hour(s))  Respiratory Panel by RT PCR (Flu A&B, Covid) - Nasopharyngeal Swab     Status: None   Collection Time: 06/26/20  8:56 PM   Specimen: Nasopharyngeal Swab  Result Value Ref Range Status   SARS Coronavirus 2 by RT PCR NEGATIVE NEGATIVE Final    Comment: (NOTE) SARS-CoV-2 target nucleic acids are NOT DETECTED.  The SARS-CoV-2 RNA is generally detectable in upper respiratoy specimens during the acute phase of infection. The lowest concentration of SARS-CoV-2 viral copies this assay can detect is 131 copies/mL. A  negative result does not preclude SARS-Cov-2 infection and should not be used as the sole basis for treatment or other patient management decisions. A negative result may occur with  improper specimen collection/handling, submission of specimen other than nasopharyngeal swab, presence of viral mutation(s) within the areas targeted by this assay, and inadequate number of viral copies (<131 copies/mL). A negative result must be combined with clinical observations, patient history, and epidemiological information. The expected result is Negative.  Fact Sheet for Patients:  https://www.moore.com/  Fact Sheet for  Healthcare Providers:  https://www.young.biz/  This test is no t yet approved or cleared by the [place] and  has been authorized for detection and/or diagnosis of SARS-CoV-2 by FDA under an Emergency Use Authorization (EUA). This EUA will remain  in effect (meaning this test can be used) for the duration of the COVID-19 declaration under Section 564(b)(1) of the Act, 21 U.S.C. section 360bbb-3(b)(1), unless the authorization is terminated or revoked sooner.     Influenza A by PCR NEGATIVE NEGATIVE Final   Influenza B by PCR NEGATIVE NEGATIVE Final    Comment: (NOTE) The Xpert Xpress SARS-CoV-2/FLU/RSV assay is intended as an aid in  the diagnosis of influenza from Nasopharyngeal swab specimens and  should not be used as a sole basis for treatment. Nasal washings and  aspirates are unacceptable for Xpert Xpress SARS-CoV-2/FLU/RSV  testing.  Fact Sheet for Patients: https://www.moore.com/  Fact Sheet for Healthcare Providers: https://www.young.biz/  This test is not yet approved or cleared by the [place] FDA and  has been authorized for detection and/or diagnosis of SARS-CoV-2 by  FDA under an Emergency Use Authorization (EUA). This EUA will remain  in effect (meaning this test can  be used) for the duration of the  Covid-19 declaration under Section 564(b)(1) of the Act, 21  U.S.C. section 360bbb-3(b)(1), unless the authorization is  terminated or revoked. Performed at [facility], [address] [place], [place] [address]   Urine culture     Status: None   Collection Time: 06/27/20  9:28 AM   Specimen: Urine, Catheterized  Result Value Ref Range Status   Specimen Description   Final    URINE, CATHETERIZED Performed at [facility], [address], [place], [place] [address]    Special Requests   Final    NONE Performed at [facility], [address], [place], [place] [address]    Culture   Final    NO GROWTH Performed at [facility], [address], [place], [place] [address]    Report Status 06/28/2020 FINAL  Final         Radiology Studies: No results found.      Scheduled Meds: . donepezil  10 mg Oral QHS  . enoxaparin (LOVENOX) injection  40 mg Subcutaneous Q24H   Continuous Infusions:    LOS: 0 days    Time spent: 30 minutes    Dorcas Carrow, MD [facility] Pager [phone]

## 2020-06-30 NOTE — Progress Notes (Signed)
Physical Therapy Treatment Patient Details Name: Jacob Rich MRN: [id number] DOB: 03/20/1931 Today's Date: 06/30/2020    History of Present Illness 86 y.o. male with medical history significant of dementia. Presenting with 5 days of lethargy and poor appetite. Hx is from his wife. He was recently in the hospital 10/24-10/26/21 for seizures and bronchitis.  He was fine for a couple of days, but on Thursday he started becoming more lethargic, decreased p.o. intake, weakness.    PT Comments    Patient is making slow progress with PT and continues to require verbal/tactile cues for sequencing transfers for safe use of RW. He required mod assist for bed mobility and transfers today to manage RW. He was able to advance gait training and ambulated ~ 8' with close chair follow and mod assist to prevent LOB. He will continue to benefit from skilled PT interventions to address impairments and progress mobility. Recommend ST at SNF fro rehab follow up.    Follow Up Recommendations  Supervision/Assistance - 24 hour;Supervision for mobility/OOB;SNF     Equipment Recommendations  Rolling walker with 5" wheels    Recommendations for Other Services       Precautions / Restrictions Precautions Precautions: Fall Restrictions Weight Bearing Restrictions: No    Mobility  Bed Mobility Overal bed mobility: Needs Assistance Bed Mobility: Supine to Sit     Supine to sit: Mod assist;HOB elevated     General bed mobility comments: pt required cues for sequencing roll to Rt side and use of bed rail. Assist required for raising trunk up and bringing LE's off EOB fully.   Transfers Overall transfer level: Needs assistance Equipment used: Rolling walker (2 wheeled) Transfers: Sit to/from Stand Sit to Stand: Min assist;From elevated surface         General transfer comment: Verbal/tactile cues for hand placement with RW, Min assist for power up and to stead in standing.    Ambulation/Gait Ambulation/Gait assistance: Min assist;Mod assist Gait Distance (Feet): 8 Feet Assistive device: Rolling walker (2 wheeled) Gait Pattern/deviations: Decreased step length - right;Decreased step length - left;Trunk flexed;Step-through pattern;Decreased stride length;Shuffle;Narrow base of support Gait velocity: decr   General Gait Details: pt with decreased hip/knee flexion and short step length. Min-Mod assist for walker managment. Pt dragging bil LE's and with narrow step width.    Stairs             Wheelchair Mobility    Modified Rankin (Stroke Patients Only)       Balance Overall balance assessment: Needs assistance Sitting-balance support: Feet supported;No upper extremity supported Sitting balance-Leahy Scale: Fair     Standing balance support: Bilateral upper extremity supported Standing balance-Leahy Scale: Poor Standing balance comment: reliant on external support for balance; pt stood at EOB with min assist to steady and RW while NT provided Max assist for perianal care due to BM.                            Cognition Arousal/Alertness: Awake/alert Behavior During Therapy: WFL for tasks assessed/performed Overall Cognitive Status: History of cognitive impairments - at baseline                                 General Comments: pt pleasant and oriented to self, not oriented to situation.      Exercises      General Comments  Pertinent Vitals/Pain Pain Assessment: No/denies pain    Home Living                      Prior Function            PT Goals (current goals can now be found in the care plan section) Acute Rehab PT Goals Patient Stated Goal: likes to read the newspaper PT Goal Formulation: With family Time For Goal Achievement: 07/12/20 Potential to Achieve Goals: Fair Progress towards PT goals: Progressing toward goals    Frequency    Min 2X/week      PT Plan Current  plan remains appropriate    Co-evaluation              AM-PAC PT "6 Clicks" Mobility   Outcome Measure  Help needed turning from your back to your side while in a flat bed without using bedrails?: A Lot Help needed moving from lying on your back to sitting on the side of a flat bed without using bedrails?: A Lot Help needed moving to and from a bed to a chair (including a wheelchair)?: A Lot Help needed standing up from a chair using your arms (e.g., wheelchair or bedside chair)?: A Lot Help needed to walk in hospital room?: A Lot Help needed climbing 3-5 steps with a railing? : Total 6 Click Score: 11    End of Session Equipment Utilized During Treatment: Gait belt Activity Tolerance: Patient tolerated treatment well Patient left: in chair;with call bell/phone within reach;with chair alarm set;with family/visitor present Nurse Communication: Mobility status PT Visit Diagnosis: Difficulty in walking, not elsewhere classified (R26.2)     Time: 2355-7322 PT Time Calculation (min) (ACUTE ONLY): 22 min  Charges:  $Therapeutic Activity: 8-22 mins                     Wynn Maudlin, DPT Acute Rehabilitation Services  Office [phone] Pager [phone]  06/30/2020 12:35 PM

## 2020-06-30 NOTE — Progress Notes (Addendum)
PROGRESS NOTE    Jacob Rich  SJG:[id number] DOB: [birth year] DOA: 06/26/2020 PCP: Renaye Rakers, MD    Brief Narrative:  86 year old gentleman with history of dementia, recent history of seizure, bronchitis.  Recently admitted to a skilled nursing facility went back home.  He was found to be sleepy all the time, difficulty walking and progressive debility so brought to ER.  In the emergency room, work-up was largely unremarkable.  UA abnormal, however he has similar urinalysis in the past and has frequent UTI.   Assessment & Plan:   Active Problems:   UTI (urinary tract infection)   Encephalopathy  Acute UTI presumed on admission: Ruled out. No positive cultures yet. No evidence of GU abnormalities on CT scan 10/24 No evidence of post void residual Treated with Rocephin, received 2 doses. Discontinued.  Progressive debility and dementia: With advanced physical deconditioning.  Will benefit with PT OT. Patient will need more supervised living, probably his dementia is progressing and he may need long-term placement. This time he will benefit with skilled nursing facility placement, will continue to work with therapies. Patient on donepezil that he will continue.  Acute kidney injury: Probably due to #1.  Treated with IV fluids and improved. Discontinue. Encourage oral intake.  DVT prophylaxis: enoxaparin (LOVENOX) injection 40 mg Start: 06/28/20 1000   Code Status: DNR Family Communication: Wife at the bedside 11/11 Disposition Plan: Status is: inpatient   The patient will require care spanning > 2 midnights and should be moved to inpatient because: Inpatient level of care appropriate due to severity of illness  Dispo: The patient is from: Home              Anticipated d/c is to: SNF              Anticipated d/c date is: When bed available.              Patient currently is medically stable to transfer to skilled level of care.  Patient admitted with progressive debility,  lethargic in the context of UTI.  He lives at home with his wife and unable to carry out his ADLs.  He is waiting to go to a skilled nursing facility when available.  Unsafe to discharge home.     Consultants:   None  Procedures:   None  Antimicrobials:   Rocephin, 11/9---   Subjective: No overnight events.  Pleasant but confused. Objective: Vitals:   06/29/20 0500 06/29/20 1403 06/29/20 2318 06/30/20 0632  BP: 131/71 131/68 (!) 142/80 135/83  Pulse: 70 90 79 87  Resp: 17 17 18 18   Temp: 98.9 F (37.2 C) 97.7 F (36.5 C) 98.8 F (37.1 C) 98.4 F (36.9 C)  TempSrc:  Oral Axillary Axillary  SpO2: 99% 99% 98% 98%  Weight:      Height:        Intake/Output Summary (Last 24 hours) at 06/30/2020 1148 Last data filed at 06/30/2020 1000 Gross per 24 hour  Intake 60 ml  Output 180 ml  Net -120 ml   Filed Weights   06/26/20 1736  Weight: 76.3 kg    Examination:  General exam: Appears calm and comfortable  Pleasant. Confused. Alert oriented x1. Respiratory system: Clear to auscultation. Respiratory effort normal.  No added sounds. Cardiovascular system: S1 & S2 heard, RRR.  Gastrointestinal system: Soft and nontender.   Central nervous system: Alert and oriented x1.  No focal neurological deficits.  Generalized weakness. Psychiatry: Judgement and insight appear compromised.  Mood &  affect flat.    Data Reviewed: I have personally reviewed following labs and imaging studies  CBC: Recent Labs  Lab 06/26/20 1846 06/27/20 1115 06/28/20 0516  WBC 7.9 8.7 6.3  NEUTROABS 5.8 7.3  --   HGB 15.8 14.8 13.8  HCT 46.9 44.1 41.2  MCV 96.5 96.9 96.3  PLT 357 278 262   Basic Metabolic Panel: Recent Labs  Lab 06/26/20 1846 06/27/20 1115 06/27/20 1116 06/28/20 0516  NA 140  --  143 141  K 4.8  --  4.5 3.9  CL 103  --  109 111  CO2 20*  --  19* 18*  GLUCOSE 91  --  87 84  BUN 28*  --  24* 20  CREATININE 1.52*  --  1.49* 1.24  CALCIUM 9.5  --  8.8* 8.7*  MG   --  2.3  --   --   PHOS  --   --  3.2  --    GFR: Estimated Creatinine Clearance: 45.3 mL/min (by C-G formula based on SCr of 1.24 mg/dL). Liver Function Tests: Recent Labs  Lab 06/26/20 1846 06/27/20 1116 06/28/20 0516  AST 25  --  27  ALT 18  --  17  ALKPHOS 69  --  58  BILITOT 1.7*  --  1.2  PROT 6.9  --  6.5  ALBUMIN 4.1 3.3* 3.5   No results for input(s): LIPASE, AMYLASE in the last 168 hours. No results for input(s): AMMONIA in the last 168 hours. Coagulation Profile: No results for input(s): INR, PROTIME in the last 168 hours. Cardiac Enzymes: No results for input(s): CKTOTAL, CKMB, CKMBINDEX, TROPONINI in the last 168 hours. BNP (last 3 results) No results for input(s): PROBNP in the last 8760 hours. HbA1C: No results for input(s): HGBA1C in the last 72 hours. CBG: No results for input(s): GLUCAP in the last 168 hours. Lipid Profile: No results for input(s): CHOL, HDL, LDLCALC, TRIG, CHOLHDL, LDLDIRECT in the last 72 hours. Thyroid Function Tests: No results for input(s): TSH, T4TOTAL, FREET4, T3FREE, THYROIDAB in the last 72 hours. Anemia Panel: No results for input(s): VITAMINB12, FOLATE, FERRITIN, TIBC, IRON, RETICCTPCT in the last 72 hours. Sepsis Labs: Recent Labs  Lab 06/27/20 1101 06/27/20 1522  LATICACIDVEN 2.2* 1.2    Recent Results (from the past 240 hour(s))  Respiratory Panel by RT PCR (Flu A&B, Covid) - Nasopharyngeal Swab     Status: None   Collection Time: 06/26/20  8:56 PM   Specimen: Nasopharyngeal Swab  Result Value Ref Range Status   SARS Coronavirus 2 by RT PCR NEGATIVE NEGATIVE Final    Comment: (NOTE) SARS-CoV-2 target nucleic acids are NOT DETECTED.  The SARS-CoV-2 RNA is generally detectable in upper respiratoy specimens during the acute phase of infection. The lowest concentration of SARS-CoV-2 viral copies this assay can detect is 131 copies/mL. A negative result does not preclude SARS-Cov-2 infection and should not be used  as the sole basis for treatment or other patient management decisions. A negative result may occur with  improper specimen collection/handling, submission of specimen other than nasopharyngeal swab, presence of viral mutation(s) within the areas targeted by this assay, and inadequate number of viral copies (<131 copies/mL). A negative result must be combined with clinical observations, patient history, and epidemiological information. The expected result is Negative.  Fact Sheet for Patients:  https://www.moore.com/  Fact Sheet for Healthcare Providers:  https://www.young.biz/  This test is no t yet approved or cleared by the [place] and  has been authorized for detection and/or diagnosis of SARS-CoV-2 by FDA under an Emergency Use Authorization (EUA). This EUA will remain  in effect (meaning this test can be used) for the duration of the COVID-19 declaration under Section 564(b)(1) of the Act, 21 U.S.C. section 360bbb-3(b)(1), unless the authorization is terminated or revoked sooner.     Influenza A by PCR NEGATIVE NEGATIVE Final   Influenza B by PCR NEGATIVE NEGATIVE Final    Comment: (NOTE) The Xpert Xpress SARS-CoV-2/FLU/RSV assay is intended as an aid in  the diagnosis of influenza from Nasopharyngeal swab specimens and  should not be used as a sole basis for treatment. Nasal washings and  aspirates are unacceptable for Xpert Xpress SARS-CoV-2/FLU/RSV  testing.  Fact Sheet for Patients: https://www.moore.com/  Fact Sheet for Healthcare Providers: https://www.young.biz/  This test is not yet approved or cleared by the [place] FDA and  has been authorized for detection and/or diagnosis of SARS-CoV-2 by  FDA under an Emergency Use Authorization (EUA). This EUA will remain  in effect (meaning this test can be used) for the duration of the  Covid-19 declaration under Section  564(b)(1) of the Act, 21  U.S.C. section 360bbb-3(b)(1), unless the authorization is  terminated or revoked. Performed at [facility], [address], [place], [place] [address]   Urine culture     Status: None   Collection Time: 06/27/20  9:28 AM   Specimen: Urine, Catheterized  Result Value Ref Range Status   Specimen Description   Final    URINE, CATHETERIZED Performed at [facility], [address] [place], [place] [address]    Special Requests   Final    NONE Performed at [facility], [address] [place], [place] [address]    Culture   Final    NO GROWTH Performed at [facility], [address] [place], [place] [address]    Report Status 06/28/2020 FINAL  Final         Radiology Studies: No results found.      Scheduled Meds:  donepezil  10 mg Oral QHS   enoxaparin (LOVENOX) injection  40 mg Subcutaneous Q24H   Continuous Infusions:    LOS: 1 day    Time spent: 30 minutes    Dorcas Carrow, MD [facility] Pager [phone]

## 2020-06-30 NOTE — TOC Progression Note (Signed)
Transition of Care [facility]) - Progression Note    Patient Details  Name: Jacob Rich MRN: [id number] Date of Birth: [birth year]  Transition of Care [facility]) CM/SW Contact  Ida Rogue, [place] Phone Number: 06/30/2020, 2:51 PM  Clinical Narrative:  Renetta Chalk at [facility] to check on authorization.  Nothing yet.  She asked that I post any updated clinicals.  Done.  She stated it is fine to call her this weekend to see if Berkley Harvey has come.  Furthermore, she states no beds until Monday due to staffing issues.  MD alerted. TOC will continue to follow during the course of hospitalization.      Expected Discharge Plan: Skilled Nursing Facility Barriers to Discharge: SNF Pending bed offer (as well as insurance authorization)  Expected Discharge Plan and Services Expected Discharge Plan: Skilled Nursing Facility   Discharge Planning Services: CM Consult Post Acute Care Choice: Skilled Nursing Facility Living arrangements for the past 2 months: Single Family Home Expected Discharge Date:  (unknown)                                     Social Determinants of Health (SDOH) Interventions    Readmission Risk Interventions No flowsheet data found.

## 2020-07-01 ENCOUNTER — Encounter: Payer: Self-pay | Admitting: Internal Medicine

## 2020-07-01 NOTE — Progress Notes (Signed)
PROGRESS NOTE    Jacob Rich  IOX:[id number] DOB: 1931-08-13 DOA: 06/26/2020 PCP: Renaye Rakers, MD    Brief Narrative:  86 year old gentleman with history of dementia, recent history of seizure, bronchitis.  Recently admitted to a skilled nursing facility went back home.  He was found to be sleepy all the time, difficulty walking and progressive debility so brought to ER.  In the emergency room, work-up was largely unremarkable.  UA abnormal, however he has similar urinalysis in the past and has frequent UTI.   Assessment & Plan:   Active Problems:   UTI (urinary tract infection)   Encephalopathy  Acute UTI presumed on admission: Ruled out. No positive cultures yet. No evidence of GU abnormalities on CT scan 10/24 No evidence of post void residual Treated with Rocephin, received 2 doses. Discontinued.  Progressive debility and dementia: With advanced physical deconditioning.  Will benefit with PT OT. Patient will need more supervised living, probably his dementia is progressing and he may need long-term placement. This time he will benefit with skilled nursing facility placement, will continue to work with therapies. Patient on donepezil that he will continue.  Acute kidney injury: Probably due to #1.  Treated with IV fluids and improved. Discontinue. Encourage oral intake.   DVT prophylaxis: enoxaparin (LOVENOX) injection 40 mg Start: 06/28/20 1000   Code Status: DNR Family Communication: Wife at bedside 11/11 Disposition Plan: Status is: Inpatient  Remains inpatient appropriate because:Unsafe d/c plan   Dispo:  Patient From: Home  Planned Disposition: Skilled Nursing Facility  Expected discharge date: 06/29/20  Medically stable for discharge: Yes Medically stable to transfer whenever bed available.        Consultants:   None  Procedures:   None  Antimicrobials:   Rocephin, 11/8-11/10   Subjective: Patient seen and examined.  No overnight events.   Slept well.  Pleasantly confused.  Denies any complaints.  Objective: Vitals:   06/30/20 1352 06/30/20 2126 07/01/20 0642 07/01/20 1006  BP: 121/60 128/73 132/65 118/73  Pulse: (!) 107 77 69 70  Resp: 17 15 16 16   Temp: (!) 97.4 F (36.3 C) 97.8 F (36.6 C)  (!) 97.5 F (36.4 C)  TempSrc: Oral Oral  Oral  SpO2: 98% 100% 100%   Weight:      Height:        Intake/Output Summary (Last 24 hours) at 07/01/2020 1105 Last data filed at 06/30/2020 1700 Gross per 24 hour  Intake 120 ml  Output 215 ml  Net -95 ml   Filed Weights   06/26/20 1736  Weight: 76.3 kg    Examination:  General exam: Appears comfortable.  Was sleepy. Respiratory system: No added sounds. Cardiovascular system: S1 & S2 heard, RRR.  Gastrointestinal system: Soft nontender.   Central nervous system: Alert and oriented x1.  No focal deficit. Extremities: Symmetric 5 x 5 power.  Generalized weakness. Psychiatry: Judgement and insight appear normal. Mood & affect flat.    Data Reviewed: I have personally reviewed following labs and imaging studies  CBC: Recent Labs  Lab 06/26/20 1846 06/27/20 1115 06/28/20 0516  WBC 7.9 8.7 6.3  NEUTROABS 5.8 7.3  --   HGB 15.8 14.8 13.8  HCT 46.9 44.1 41.2  MCV 96.5 96.9 96.3  PLT 357 278 262   Basic Metabolic Panel: Recent Labs  Lab 06/26/20 1846 06/27/20 1115 06/27/20 1116 06/28/20 0516  NA 140  --  143 141  K 4.8  --  4.5 3.9  CL 103  --  109 111  CO2 20*  --  19* 18*  GLUCOSE 91  --  87 84  BUN 28*  --  24* 20  CREATININE 1.52*  --  1.49* 1.24  CALCIUM 9.5  --  8.8* 8.7*  MG  --  2.3  --   --   PHOS  --   --  3.2  --    GFR: Estimated Creatinine Clearance: 45.3 mL/min (by C-G formula based on SCr of 1.24 mg/dL). Liver Function Tests: Recent Labs  Lab 06/26/20 1846 06/27/20 1116 06/28/20 0516  AST 25  --  27  ALT 18  --  17  ALKPHOS 69  --  58  BILITOT 1.7*  --  1.2  PROT 6.9  --  6.5  ALBUMIN 4.1 3.3* 3.5   No results for input(s):  LIPASE, AMYLASE in the last 168 hours. No results for input(s): AMMONIA in the last 168 hours. Coagulation Profile: No results for input(s): INR, PROTIME in the last 168 hours. Cardiac Enzymes: No results for input(s): CKTOTAL, CKMB, CKMBINDEX, TROPONINI in the last 168 hours. BNP (last 3 results) No results for input(s): PROBNP in the last 8760 hours. HbA1C: No results for input(s): HGBA1C in the last 72 hours. CBG: No results for input(s): GLUCAP in the last 168 hours. Lipid Profile: No results for input(s): CHOL, HDL, LDLCALC, TRIG, CHOLHDL, LDLDIRECT in the last 72 hours. Thyroid Function Tests: No results for input(s): TSH, T4TOTAL, FREET4, T3FREE, THYROIDAB in the last 72 hours. Anemia Panel: No results for input(s): VITAMINB12, FOLATE, FERRITIN, TIBC, IRON, RETICCTPCT in the last 72 hours. Sepsis Labs: Recent Labs  Lab 06/27/20 1101 06/27/20 1522  LATICACIDVEN 2.2* 1.2    Recent Results (from the past 240 hour(s))  Respiratory Panel by RT PCR (Flu A&B, Covid) - Nasopharyngeal Swab     Status: None   Collection Time: 06/26/20  8:56 PM   Specimen: Nasopharyngeal Swab  Result Value Ref Range Status   SARS Coronavirus 2 by RT PCR NEGATIVE NEGATIVE Final    Comment: (NOTE) SARS-CoV-2 target nucleic acids are NOT DETECTED.  The SARS-CoV-2 RNA is generally detectable in upper respiratoy specimens during the acute phase of infection. The lowest concentration of SARS-CoV-2 viral copies this assay can detect is 131 copies/mL. A negative result does not preclude SARS-Cov-2 infection and should not be used as the sole basis for treatment or other patient management decisions. A negative result may occur with  improper specimen collection/handling, submission of specimen other than nasopharyngeal swab, presence of viral mutation(s) within the areas targeted by this assay, and inadequate number of viral copies (<131 copies/mL). A negative result must be combined with  clinical observations, patient history, and epidemiological information. The expected result is Negative.  Fact Sheet for Patients:  https://www.moore.com/  Fact Sheet for Healthcare Providers:  https://www.young.biz/  This test is no t yet approved or cleared by the [place] FDA and  has been authorized for detection and/or diagnosis of SARS-CoV-2 by FDA under an Emergency Use Authorization (EUA). This EUA will remain  in effect (meaning this test can be used) for the duration of the COVID-19 declaration under Section 564(b)(1) of the Act, 21 U.S.C. section 360bbb-3(b)(1), unless the authorization is terminated or revoked sooner.     Influenza A by PCR NEGATIVE NEGATIVE Final   Influenza B by PCR NEGATIVE NEGATIVE Final    Comment: (NOTE) The Xpert Xpress SARS-CoV-2/FLU/RSV assay is intended as an aid in  the diagnosis of influenza from Nasopharyngeal  swab specimens and  should not be used as a sole basis for treatment. Nasal washings and  aspirates are unacceptable for Xpert Xpress SARS-CoV-2/FLU/RSV  testing.  Fact Sheet for Patients: https://www.moore.com/  Fact Sheet for Healthcare Providers: https://www.young.biz/  This test is not yet approved or cleared by the [place] FDA and  has been authorized for detection and/or diagnosis of SARS-CoV-2 by  FDA under an Emergency Use Authorization (EUA). This EUA will remain  in effect (meaning this test can be used) for the duration of the  Covid-19 declaration under Section 564(b)(1) of the Act, 21  U.S.C. section 360bbb-3(b)(1), unless the authorization is  terminated or revoked. Performed at [facility], [address] [place], [place] [address]   Urine culture     Status: None   Collection Time: 06/27/20  9:28 AM   Specimen: Urine, Catheterized  Result Value Ref Range Status   Specimen Description   Final     URINE, CATHETERIZED Performed at [facility], [address], [place], [place] [address]    Special Requests   Final    NONE Performed at [facility], [address] [place], [place] [address]    Culture   Final    NO GROWTH Performed at [facility], [address], [place], [place] [address]    Report Status 06/28/2020 FINAL  Final         Radiology Studies: No results found.      Scheduled Meds: . donepezil  10 mg Oral QHS  . enoxaparin (LOVENOX) injection  40 mg Subcutaneous Q24H   Continuous Infusions:   LOS: 2 days    Time spent: 25 minutes    Dorcas Carrow, MD [facility] Pager [phone]

## 2020-07-02 NOTE — Progress Notes (Signed)
PROGRESS NOTE    Jacob Rich  ZOX:[id number] DOB: [birth year] DOA: 06/26/2020 PCP: Renaye Rakers, MD    Brief Narrative:  86 year old gentleman with history of dementia, recent history of seizure, bronchitis.  Recently admitted to a skilled nursing facility went back home.  He was found to be sleepy all the time, difficulty walking and progressive debility so brought to ER.  In the emergency room, work-up was largely unremarkable.  UA abnormal, however he has similar urinalysis in the past and has frequent UTI.   Assessment & Plan:   Active Problems:   UTI (urinary tract infection)   Encephalopathy  Presumed UTI on admission: Ruled out. No positive cultures yet. No evidence of GU abnormalities on CT scan 10/24 No evidence of post void residual Treated with Rocephin, received 2 doses. Discontinued.  Progressive debility and dementia: With advanced physical deconditioning.  Will benefit with PT OT. Patient will need more supervised living, probably his dementia is progressing and he may need long-term placement. This time he will benefit with skilled nursing facility placement, will continue to work with therapies. Patient on donepezil that he will continue.  Acute kidney injury: Probably due to #1.  Treated with IV fluids and improved. Discontinue. Encourage oral intake.   DVT prophylaxis: enoxaparin (LOVENOX) injection 40 mg Start: 06/28/20 1000   Code Status: DNR Family Communication: Wife at bedside 11/11, phone call placed unable to reach. Disposition Plan: Status is: Inpatient  Remains inpatient appropriate because:Unsafe d/c plan   Dispo:  Patient From: Home  Planned Disposition: Skilled Nursing Facility  Expected discharge date: 06/29/20  Medically stable for discharge: Yes Medically stable to transfer whenever bed available.        Consultants:   None  Procedures:   None  Antimicrobials:   Rocephin, 11/8-11/10   Subjective: No overnight  events.  Objective: Vitals:   07/01/20 1006 07/01/20 1412 07/01/20 2051 07/02/20 0515  BP: 118/73 120/64 121/66 124/67  Pulse: 70 74 89 73  Resp: 16 16 17 17   Temp: (!) 97.5 F (36.4 C) 97.9 F (36.6 C) 97.8 F (36.6 C) 98.4 F (36.9 C)  TempSrc: Oral Oral    SpO2:  100% 99% 98%  Weight:      Height:        Intake/Output Summary (Last 24 hours) at 07/02/2020 1133 Last data filed at 07/02/2020 0900 Gross per 24 hour  Intake 340 ml  Output 300 ml  Net 40 ml   Filed Weights   06/26/20 1736  Weight: 76.3 kg    Examination:  General exam: Appears comfortable. Able to communicate. Behavior is well controlled. Respiratory system: No added sounds. Cardiovascular system: S1 & S2 heard, RRR.  Gastrointestinal system: Soft nontender.   Central nervous system: Alert and oriented x1.  No focal deficit. Extremities: Symmetric 5 x 5 power.  Generalized weakness. Psychiatry: Judgement and insight appear normal. Mood & affect flat.    Data Reviewed: I have personally reviewed following labs and imaging studies  CBC: Recent Labs  Lab 06/26/20 1846 06/27/20 1115 06/28/20 0516  WBC 7.9 8.7 6.3  NEUTROABS 5.8 7.3  --   HGB 15.8 14.8 13.8  HCT 46.9 44.1 41.2  MCV 96.5 96.9 96.3  PLT 357 278 262   Basic Metabolic Panel: Recent Labs  Lab 06/26/20 1846 06/27/20 1115 06/27/20 1116 06/28/20 0516  NA 140  --  143 141  K 4.8  --  4.5 3.9  CL 103  --  109 111  CO2 20*  --  19* 18*  GLUCOSE 91  --  87 84  BUN 28*  --  24* 20  CREATININE 1.52*  --  1.49* 1.24  CALCIUM 9.5  --  8.8* 8.7*  MG  --  2.3  --   --   PHOS  --   --  3.2  --    GFR: Estimated Creatinine Clearance: 45.3 mL/min (by C-G formula based on SCr of 1.24 mg/dL). Liver Function Tests: Recent Labs  Lab 06/26/20 1846 06/27/20 1116 06/28/20 0516  AST 25  --  27  ALT 18  --  17  ALKPHOS 69  --  58  BILITOT 1.7*  --  1.2  PROT 6.9  --  6.5  ALBUMIN 4.1 3.3* 3.5   No results for input(s): LIPASE,  AMYLASE in the last 168 hours. No results for input(s): AMMONIA in the last 168 hours. Coagulation Profile: No results for input(s): INR, PROTIME in the last 168 hours. Cardiac Enzymes: No results for input(s): CKTOTAL, CKMB, CKMBINDEX, TROPONINI in the last 168 hours. BNP (last 3 results) No results for input(s): PROBNP in the last 8760 hours. HbA1C: No results for input(s): HGBA1C in the last 72 hours. CBG: No results for input(s): GLUCAP in the last 168 hours. Lipid Profile: No results for input(s): CHOL, HDL, LDLCALC, TRIG, CHOLHDL, LDLDIRECT in the last 72 hours. Thyroid Function Tests: No results for input(s): TSH, T4TOTAL, FREET4, T3FREE, THYROIDAB in the last 72 hours. Anemia Panel: No results for input(s): VITAMINB12, FOLATE, FERRITIN, TIBC, IRON, RETICCTPCT in the last 72 hours. Sepsis Labs: Recent Labs  Lab 06/27/20 1101 06/27/20 1522  LATICACIDVEN 2.2* 1.2    Recent Results (from the past 240 hour(s))  Respiratory Panel by RT PCR (Flu A&B, Covid) - Nasopharyngeal Swab     Status: None   Collection Time: 06/26/20  8:56 PM   Specimen: Nasopharyngeal Swab  Result Value Ref Range Status   SARS Coronavirus 2 by RT PCR NEGATIVE NEGATIVE Final    Comment: (NOTE) SARS-CoV-2 target nucleic acids are NOT DETECTED.  The SARS-CoV-2 RNA is generally detectable in upper respiratoy specimens during the acute phase of infection. The lowest concentration of SARS-CoV-2 viral copies this assay can detect is 131 copies/mL. A negative result does not preclude SARS-Cov-2 infection and should not be used as the sole basis for treatment or other patient management decisions. A negative result may occur with  improper specimen collection/handling, submission of specimen other than nasopharyngeal swab, presence of viral mutation(s) within the areas targeted by this assay, and inadequate number of viral copies (<131 copies/mL). A negative result must be combined with  clinical observations, patient history, and epidemiological information. The expected result is Negative.  Fact Sheet for Patients:  https://www.moore.com/  Fact Sheet for Healthcare Providers:  https://www.young.biz/  This test is no t yet approved or cleared by the [place] FDA and  has been authorized for detection and/or diagnosis of SARS-CoV-2 by FDA under an Emergency Use Authorization (EUA). This EUA will remain  in effect (meaning this test can be used) for the duration of the COVID-19 declaration under Section 564(b)(1) of the Act, 21 U.S.C. section 360bbb-3(b)(1), unless the authorization is terminated or revoked sooner.     Influenza A by PCR NEGATIVE NEGATIVE Final   Influenza B by PCR NEGATIVE NEGATIVE Final    Comment: (NOTE) The Xpert Xpress SARS-CoV-2/FLU/RSV assay is intended as an aid in  the diagnosis of influenza from Nasopharyngeal swab specimens and  should not be used  as a sole basis for treatment. Nasal washings and  aspirates are unacceptable for Xpert Xpress SARS-CoV-2/FLU/RSV  testing.  Fact Sheet for Patients: https://www.moore.com/  Fact Sheet for Healthcare Providers: https://www.young.biz/  This test is not yet approved or cleared by the [place] FDA and  has been authorized for detection and/or diagnosis of SARS-CoV-2 by  FDA under an Emergency Use Authorization (EUA). This EUA will remain  in effect (meaning this test can be used) for the duration of the  Covid-19 declaration under Section 564(b)(1) of the Act, 21  U.S.C. section 360bbb-3(b)(1), unless the authorization is  terminated or revoked. Performed at [facility], [address] [place], [place] [address]   Urine culture     Status: None   Collection Time: 06/27/20  9:28 AM   Specimen: Urine, Catheterized  Result Value Ref Range Status   Specimen Description   Final     URINE, CATHETERIZED Performed at [facility], [address] [place], [place] [address]    Special Requests   Final    NONE Performed at [facility], [address] [place], [place] [address]    Culture   Final    NO GROWTH Performed at [facility], [address] [place], [place] [address]    Report Status 06/28/2020 FINAL  Final         Radiology Studies: No results found.      Scheduled Meds: . donepezil  10 mg Oral QHS  . enoxaparin (LOVENOX) injection  40 mg Subcutaneous Q24H   Continuous Infusions:   LOS: 3 days    Time spent: 25 minutes    Dorcas Carrow, MD [facility] Pager [phone]

## 2020-07-03 NOTE — Care Management Important Message (Signed)
Important Message  Patient Details IM Letter given to the Patient. Name: Aurther Harlin MRN: [id number] Date of Birth: [birth year]   Medicare Important Message Given:  Yes     [name] 07/03/2020, 12:45 PM

## 2020-07-03 NOTE — Discharge Summary (Signed)
Physician Discharge Summary  Jacob Rich JYN:[id number] DOB: 10/24/1930 DOA: 06/26/2020  PCP: Renaye RakersBland, Veita, MD  Admit date: 06/26/2020 Discharge date: 07/03/2020  Admitted From: Home Disposition: Skilled nursing facility  Recommendations for Outpatient Follow-up:  1. Follow up with PCP in 1-2 weeks 2.   Home Health: Not applicable Equipment/Devices: Not applicable  Discharge Condition: Stable CODE STATUS: DNR Diet recommendation: Regular diet  Discharge summary: 86 year old gentleman with advanced dementia, recent history of seizure, chronic bronchitis.  He was brought from home where he was found to be lethargic, sleeping all the time, difficulty walking and progressive debility.  In the emergency room his work-up was largely unremarkable.  He had poor appetite.  Initial UA was abnormal however subsequent urine culture was negative.  Was admitted to the hospital with advanced debility and ambulatory dysfunction.  Presumed UTI on admission: Ruled out.  Urine cultures were negative.  Recent CT scan with no evidence of GU abnormalities.  No post void residual.  Currently urinating well and asymptomatic.  Progressive debility with physical deconditioning, progressive dementia: Behavior is well controlled.  On admission, he was given a dose of Haldol.  He has not used any symptom control medication for last 3 days.  His behavior is well controlled and not needing any additional medication support. Patient is very deconditioned and he will benefit with inpatient therapies so referred to SNF. Patient is on donepezil that he will continue.  Acute kidney injury: Present on admission.  Treated with IV fluids and improved.  Now he has adequate oral intake.  Patient is medically stabilized.  Transfer to a skilled level of care when bed is available.   Discharge Diagnoses:  Active Problems:   UTI (urinary tract infection)   Encephalopathy    Discharge Instructions  Discharge Instructions     Diet general   Complete by: As directed    Increase activity slowly   Complete by: As directed      Allergies as of 07/03/2020   No Known Allergies     Medication List    TAKE these medications   acetaminophen 325 MG tablet Commonly known as: TYLENOL Take 2 tablets (650 mg total) by mouth every 6 (six) hours as needed for mild pain (or Fever >/= 101).   donepezil 10 MG tablet Commonly known as: ARICEPT Take 1 tablet (10 mg total) by mouth at bedtime. What changed: when to take this   folic acid 1 MG tablet Commonly known as: FOLVITE Take 1 mg by mouth at bedtime.   multivitamins with iron Tabs tablet Take 1 tablet by mouth daily.   pantoprazole 40 MG tablet Commonly known as: PROTONIX Take 1 tablet (40 mg total) by mouth daily.   VITAMIN B-12 PO Take 1 tablet by mouth daily with lunch.       Contact information for after-discharge care    Destination    [facility] .   Service: Skilled Nursing Contact information: [address] [place] [id number] [phone]                 No Known Allergies  Consultations:  None   Procedures/Studies: EEG  Result Date: 06/13/2020 Charlsie QuestYadav, Priyanka O, MD     06/13/2020  8:27 AM Patient Name: Jacob Rich MRN: [id number] Epilepsy Attending: Charlsie QuestPriyanka O Yadav Referring Physician/Provider: Dr. Midge MiniumArshad Kakrakandy Date: 06/12/2020 Duration: 23.17 mins Patient history: 86 year old male with history of advanced dementia who presented after persistent nausea and vomiting as well as an episode of whole body  shaking.  EEG to evaluate for seizures. Level of alertness: Awake, asleep AEDs during EEG study: None Technical aspects: This EEG study was done with scalp electrodes positioned according to the 10-20 International system of electrode placement. Electrical activity was acquired at a sampling rate of 500Hz  and reviewed with a high frequency filter of 70Hz  and a low frequency filter of 1Hz . EEG data  were recorded continuously and digitally stored. Description: No clear posterior dominant rhythm was seen.  Sleep was characterized by vertex waves, sleep spindles (12 to 14 Hz), maximal frontocentral region. EEG showed continuous generalized 3 to 6 Hz theta-delta slowing. Hyperventilation and photic stimulation were not performed.   ABNORMALITY -Continuous slow, generalized IMPRESSION: This study is suggestive of moderate diffuse encephalopathy, nonspecific etiology. No seizures or epileptiform discharges were seen throughout the recording.   CT Head Wo Contrast  Result Date: 06/11/2020 CLINICAL DATA:  Fever and vomiting. EXAM: CT HEAD WITHOUT CONTRAST TECHNIQUE: Contiguous axial images were obtained from the base of the skull through the vertex without intravenous contrast. COMPARISON:  September 08, 2019 FINDINGS: Brain: There is mild cerebral atrophy with widening of the extra-axial spaces and ventricular dilatation. There are areas of decreased attenuation within the white matter tracts of the supratentorial brain, consistent with microvascular disease changes. Vascular: No hyperdense vessel or unexpected calcification. Skull: Normal. Negative for fracture or focal lesion. Sinuses/Orbits: No acute finding. Other: Mild scalp soft tissue swelling is seen along the vertex on the right. IMPRESSION: Generalized cerebral atrophy, without evidence of acute intracranial abnormality. Electronically Signed   By: Charlsie Quest M.D.   On: 06/11/2020 19:58   CT Angio Chest PE W and/or Wo Contrast  Result Date: 06/27/2020 CLINICAL DATA:  Increasingly lethargic for 3 days EXAM: CT ANGIOGRAPHY CHEST WITH CONTRAST TECHNIQUE: Multidetector CT imaging of the chest was performed using the standard protocol during bolus administration of intravenous contrast. Multiplanar CT image reconstructions and MIPs were obtained to evaluate the vascular anatomy. CONTRAST:  60mL OMNIPAQUE IOHEXOL 350 MG/ML SOLN  COMPARISON:  Radiograph 06/26/2020 FINDINGS: Cardiovascular: Satisfactory opacification the pulmonary arteries to the segmental level. No pulmonary artery filling defects are identified. Central pulmonary arteries are normal caliber. Normal heart size. No pericardial effusion. Three-vessel coronary artery atherosclerosis is noted. Suboptimal opacification of the aorta. The aortic root is suboptimally assessed given cardiac pulsation artifact. Atherosclerotic plaque within the normal caliber aorta. No clear acute luminal abnormality. No periaortic stranding or hemorrhage. Shared origin of the brachiocephalic and left common carotid arteries. Proximal great vessels are moderately calcified but otherwise free of acute abnormality. No major venous abnormality. Mediastinum/Nodes: No mediastinal fluid or gas. Normal thyroid gland and thoracic inlet. No acute abnormality of the trachea or esophagus. No worrisome mediastinal, hilar or axillary adenopathy. Lungs/Pleura: There is some mild vascular redistribution with interlobular septal thickening and some dependent areas of ground-glass opacity which could reflect a combination of interstitial edema and atelectasis with more patchy opacity towards a posterior costophrenic sulci. Some additional areas of fine subpleural reticular change are present throughout both lungs incompletely assessed on this exam and likely accentuated by imaging during exhalation for the angiographic technique. Upper Abdomen: Multiple calcified gallstones layering within the otherwise normal gallbladder. No acute abnormalities present in the visualized portions of the upper abdomen. Musculoskeletal: No acute osseous abnormality or suspicious osseous lesion. Multilevel degenerative changes are present in the imaged portions of the spine. Additional degenerative changes in the shoulders. No worrisome chest wall lesions. Review of the MIP images  confirms the above findings. IMPRESSION: 1. No evidence  of acute pulmonary artery filling defects to suggest pulmonary embolism. 2. Septal thickening and vascular redistribution suggesting some interstitial edema superimposed on some atelectatic changes towards the lung bases. Infection is less favored. 3. Additional areas of fine subpleural reticular change are present throughout both lungs incompletely assessed on this exam. Consider outpatient evaluation with HRCT. 4. Cholelithiasis without evidence of acute cholecystitis. 5. Three-vessel coronary artery atherosclerosis. 6. Aortic Atherosclerosis (ICD10-I70.0). Electronically Signed   By: Kreg Shropshire M.D.   On: 06/27/2020 01:46   CT ABDOMEN PELVIS W CONTRAST  Result Date: 06/11/2020 CLINICAL DATA:  Vomiting and fever. EXAM: CT ABDOMEN AND PELVIS WITH CONTRAST TECHNIQUE: Multidetector CT imaging of the abdomen and pelvis was performed using the standard protocol following bolus administration of intravenous contrast. CONTRAST:  OMNIPAQUE IOHEXOL 300 MG/ML  SOLN COMPARISON:  None. FINDINGS: Lower chest: Mild to moderate severity scarring and fibrosis is seen involving the bilateral lung bases. Hepatobiliary: No focal liver abnormality is seen. Multiple subcentimeter gallstones are seen within the gallbladder lumen without evidence of gallbladder wall thickening or biliary dilatation. Pancreas: Unremarkable. No pancreatic ductal dilatation or surrounding inflammatory changes. Spleen: Normal in size without focal abnormality. Adrenals/Urinary Tract: Adrenal glands are unremarkable. Kidneys are normal, without renal calculi, focal lesion, or hydronephrosis. Bladder is unremarkable. Stomach/Bowel: There is a small hiatal hernia. Appendix appears normal. No evidence of bowel dilatation. Noninflamed diverticula are seen throughout the large bowel. Vascular/Lymphatic: There is moderate to marked severity calcification of the abdominal aorta and bilateral common iliac arteries, without evidence of aneurysmal  dilatation. No enlarged abdominal or pelvic lymph nodes. Reproductive: Prostate gland is moderately enlarged. Moderate severity prostate calcification is also seen. Other: No abdominal wall hernia or abnormality. No abdominopelvic ascites. Musculoskeletal: A very small amount of air is seen within the subcutaneous fat along the anterior aspect of the lower pelvic wall on the right. Chronic lateral eighth and ninth right rib fractures are seen. Multilevel degenerative changes seen throughout the lumbar spine. IMPRESSION: 1. Cholelithiasis without evidence of acute cholecystitis. 2. Colonic diverticulosis. 3. Small hiatal hernia. 4. Chronic lateral eighth and ninth right rib fractures. 5. Aortic atherosclerosis. 6. Enlarged prostate gland. Aortic Atherosclerosis (ICD10-I70.0). Electronically Signed   By: Aram Candela M.D.   On: 06/11/2020 23:36   DG Chest Portable 1 View  Result Date: 06/26/2020 CLINICAL DATA:  Shortness of breath EXAM: PORTABLE CHEST 1 VIEW COMPARISON:  June 11, 2020 FINDINGS: The heart size and mediastinal contours are within normal limits. Again noted are mildly increased interstitial markings seen at both lung bases, likely chronic lung changes versus atelectasis. No new airspace consolidation or pleural effusion. No acute osseous abnormality. IMPRESSION: Stable increased interstitial markings of both lung bases which could be due to atelectasis and/or chronic changes. Electronically Signed   By: Jonna Clark M.D.   On: 06/26/2020 18:19   DG Chest Port 1 View  Result Date: 06/11/2020 CLINICAL DATA:  Vomiting. EXAM: PORTABLE CHEST 1 VIEW COMPARISON:  Jan 04, 2020 FINDINGS: Mild, stable hazy areas of atelectasis are seen within the bilateral lung bases, left greater than right. There is no evidence of a pleural effusion or pneumothorax. The cardiac silhouette is mildly enlarged and unchanged in size. Multilevel degenerative changes seen throughout the thoracic spine. IMPRESSION:  Mild, stable bibasilar atelectasis, left greater than right. Electronically Signed   By: Aram Candela M.D.   On: 06/11/2020 17:26    (Echo, Carotid, EGD, Colonoscopy, ERCP)  Subjective: Patient seen and examined.  No overnight events.  He is quiet, composed.  Eating his breakfast.   Discharge Exam: Vitals:   07/02/20 2106 07/03/20 0518  BP: 131/67 140/83  Pulse: 80 83  Resp: 17 15  Temp: 98 F (36.7 C)   SpO2: 98% 95%   Vitals:   07/02/20 0515 07/02/20 1355 07/02/20 2106 07/03/20 0518  BP: 124/67 115/66 131/67 140/83  Pulse: 73 81 80 83  Resp: 17 15 17 15   Temp: 98.4 F (36.9 C) 98.1 F (36.7 C) 98 F (36.7 C)   TempSrc:   Oral   SpO2: 98% 97% 98% 95%  Weight:      Height:        General: Pt is alert, awake, not in acute distress Patient is pleasant but confused.  He is alert oriented x1.  He is awake and can keep up conversation. Cardiovascular: RRR, S1/S2 +, no rubs, no gallops Respiratory: CTA bilaterally, no wheezing, no rhonchi Abdominal: Soft, NT, ND, bowel sounds + Extremities: no edema, no cyanosis    The results of significant diagnostics from this hospitalization (including imaging, microbiology, ancillary and laboratory) are listed below for reference.     Microbiology: Recent Results (from the past 240 hour(s))  Respiratory Panel by RT PCR (Flu A&B, Covid) - Nasopharyngeal Swab     Status: None   Collection Time: 06/26/20  8:56 PM   Specimen: Nasopharyngeal Swab  Result Value Ref Range Status   SARS Coronavirus 2 by RT PCR NEGATIVE NEGATIVE Final    Comment: (NOTE) SARS-CoV-2 target nucleic acids are NOT DETECTED.  The SARS-CoV-2 RNA is generally detectable in upper respiratoy specimens during the acute phase of infection. The lowest concentration of SARS-CoV-2 viral copies this assay can detect is 131 copies/mL. A negative result does not preclude SARS-Cov-2 infection and should not be used as the sole basis for treatment or other  patient management decisions. A negative result may occur with  improper specimen collection/handling, submission of specimen other than nasopharyngeal swab, presence of viral mutation(s) within the areas targeted by this assay, and inadequate number of viral copies (<131 copies/mL). A negative result must be combined with clinical observations, patient history, and epidemiological information. The expected result is Negative.  Fact Sheet for Patients:  13/08/21  Fact Sheet for Healthcare Providers:  https://www.moore.com/  This test is no t yet approved or cleared by the https://www.young.biz/ FDA and  has been authorized for detection and/or diagnosis of SARS-CoV-2 by FDA under an Emergency Use Authorization (EUA). This EUA will remain  in effect (meaning this test can be used) for the duration of the COVID-19 declaration under Section 564(b)(1) of the Act, 21 U.S.C. section 360bbb-3(b)(1), unless the authorization is terminated or revoked sooner.     Influenza A by PCR NEGATIVE NEGATIVE Final   Influenza B by PCR NEGATIVE NEGATIVE Final    Comment: (NOTE) The Xpert Xpress SARS-CoV-2/FLU/RSV assay is intended as an aid in  the diagnosis of influenza from Nasopharyngeal swab specimens and  should not be used as a sole basis for treatment. Nasal washings and  aspirates are unacceptable for Xpert Xpress SARS-CoV-2/FLU/RSV  testing.  Fact Sheet for Patients: [place]  Fact Sheet for Healthcare Providers: https://www.moore.com/  This test is not yet approved or cleared by the https://www.young.biz/ FDA and  has been authorized for detection and/or diagnosis of SARS-CoV-2 by  FDA under an Emergency Use Authorization (EUA). This EUA will remain  in effect (meaning this test  can be used) for the duration of the  Covid-19 declaration under Section 564(b)(1) of the Act, 21  U.S.C. section  360bbb-3(b)(1), unless the authorization is  terminated or revoked. Performed at [facility], [address] [place], [place] [address]   Urine culture     Status: None   Collection Time: 06/27/20  9:28 AM   Specimen: Urine, Catheterized  Result Value Ref Range Status   Specimen Description   Final    URINE, CATHETERIZED Performed at [facility], [address] [place], [place] [address]    Special Requests   Final    NONE Performed at [facility], [address], [place], [place] [address]    Culture   Final    NO GROWTH Performed at [facility], [address], [place], [place] [address]    Report Status 06/28/2020 FINAL  Final     Labs: BNP (last 3 results) No results for input(s): BNP in the last 8760 hours. Basic Metabolic Panel: Recent Labs  Lab 06/26/20 1846 06/27/20 1115 06/27/20 1116 06/28/20 0516  NA 140  --  143 141  K 4.8  --  4.5 3.9  CL 103  --  109 111  CO2 20*  --  19* 18*  GLUCOSE 91  --  87 84  BUN 28*  --  24* 20  CREATININE 1.52*  --  1.49* 1.24  CALCIUM 9.5  --  8.8* 8.7*  MG  --  2.3  --   --   PHOS  --   --  3.2  --    Liver Function Tests: Recent Labs  Lab 06/26/20 1846 06/27/20 1116 06/28/20 0516  AST 25  --  27  ALT 18  --  17  ALKPHOS 69  --  58  BILITOT 1.7*  --  1.2  PROT 6.9  --  6.5  ALBUMIN 4.1 3.3* 3.5   No results for input(s): LIPASE, AMYLASE in the last 168 hours. No results for input(s): AMMONIA in the last 168 hours. CBC: Recent Labs  Lab 06/26/20 1846 06/27/20 1115 06/28/20 0516  WBC 7.9 8.7 6.3  NEUTROABS 5.8 7.3  --   HGB 15.8 14.8 13.8  HCT 46.9 44.1 41.2  MCV 96.5 96.9 96.3  PLT 357 278 262   Cardiac Enzymes: No results for input(s): CKTOTAL, CKMB, CKMBINDEX, TROPONINI in the last 168 hours. BNP: Invalid input(s): POCBNP CBG: No results for input(s): GLUCAP in the last 168 hours. D-Dimer No results for input(s): DDIMER in the  last 72 hours. Hgb A1c No results for input(s): HGBA1C in the last 72 hours. Lipid Profile No results for input(s): CHOL, HDL, LDLCALC, TRIG, CHOLHDL, LDLDIRECT in the last 72 hours. Thyroid function studies No results for input(s): TSH, T4TOTAL, T3FREE, THYROIDAB in the last 72 hours.  Invalid input(s): FREET3 Anemia work up No results for input(s): VITAMINB12, FOLATE, FERRITIN, TIBC, IRON, RETICCTPCT in the last 72 hours. Urinalysis    Component Value Date/Time   COLORURINE YELLOW 06/27/2020 0928   APPEARANCEUR CLEAR 06/27/2020 0928   LABSPEC >1.046 (H) 06/27/2020 0928   PHURINE 5.0 06/27/2020 0928   GLUCOSEU NEGATIVE 06/27/2020 0928   HGBUR LARGE (A) 06/27/2020 0928   BILIRUBINUR NEGATIVE 06/27/2020 0928   KETONESUR 20 (A) 06/27/2020 0928   PROTEINUR 100 (A) 06/27/2020 0928   NITRITE NEGATIVE 06/27/2020 0928   LEUKOCYTESUR MODERATE (A) 06/27/2020 0928   Sepsis Labs Invalid input(s): PROCALCITONIN,  WBC,  LACTICIDVEN  Microbiology Recent Results (from the past 240 hour(s))  Respiratory Panel by RT PCR (Flu A&B, Covid) - Nasopharyngeal Swab     Status: None   Collection Time: 06/26/20  8:56 PM   Specimen: Nasopharyngeal Swab  Result Value Ref Range Status   SARS Coronavirus 2 by RT PCR NEGATIVE NEGATIVE Final    Comment: (NOTE) SARS-CoV-2 target nucleic acids are NOT DETECTED.  The SARS-CoV-2 RNA is generally detectable in upper respiratoy specimens during the acute phase of infection. The lowest concentration of SARS-CoV-2 viral copies this assay can detect is 131 copies/mL. A negative result does not preclude SARS-Cov-2 infection and should not be used as the sole basis for treatment or other patient management decisions. A negative result may occur with  improper specimen collection/handling, submission of specimen other than nasopharyngeal swab, presence of viral mutation(s) within the areas targeted by this assay, and inadequate number of viral copies (<131  copies/mL). A negative result must be combined with clinical observations, patient history, and epidemiological information. The expected result is Negative.  Fact Sheet for Patients:  https://www.moore.com/  Fact Sheet for Healthcare Providers:  https://www.young.biz/  This test is no t yet approved or cleared by the [place] FDA and  has been authorized for detection and/or diagnosis of SARS-CoV-2 by FDA under an Emergency Use Authorization (EUA). This EUA will remain  in effect (meaning this test can be used) for the duration of the COVID-19 declaration under Section 564(b)(1) of the Act, 21 U.S.C. section 360bbb-3(b)(1), unless the authorization is terminated or revoked sooner.     Influenza A by PCR NEGATIVE NEGATIVE Final   Influenza B by PCR NEGATIVE NEGATIVE Final    Comment: (NOTE) The Xpert Xpress SARS-CoV-2/FLU/RSV assay is intended as an aid in  the diagnosis of influenza from Nasopharyngeal swab specimens and  should not be used as a sole basis for treatment. Nasal washings and  aspirates are unacceptable for Xpert Xpress SARS-CoV-2/FLU/RSV  testing.  Fact Sheet for Patients: https://www.moore.com/  Fact Sheet for Healthcare Providers: https://www.young.biz/  This test is not yet approved or cleared by the [place] FDA and  has been authorized for detection and/or diagnosis of SARS-CoV-2 by  FDA under an Emergency Use Authorization (EUA). This EUA will remain  in effect (meaning this test can be used) for the duration of the  Covid-19 declaration under Section 564(b)(1) of the Act, 21  U.S.C. section 360bbb-3(b)(1), unless the authorization is  terminated or revoked. Performed at [facility], [address] [place], [place] [address]   Urine culture     Status: None   Collection Time: 06/27/20  9:28 AM   Specimen: Urine, Catheterized  Result Value Ref  Range Status   Specimen Description   Final    URINE, CATHETERIZED Performed at [facility], [address] [place], [place] [address]    Special Requests   Final    NONE Performed at [facility], [address] [place], [place] [address]    Culture   Final    NO GROWTH Performed at [facility], [address] [place], [place] [address]    Report Status 06/28/2020 FINAL  Final     Time coordinating discharge:  32 minutes  SIGNED:   Dorcas Carrow, MD  [facility] 07/03/2020, 12:40 PM

## 2020-07-03 NOTE — Progress Notes (Signed)
Pt noted to be coughing/choking on dinner plate. Pt coughed up chicken and moderate amount of flem all over the bed. Pt O2 sats 98%. Pt still c/o feeling like something stuck in his throat and coughed up more chicken. Pt was sat up in bed and in no acute distress at this time. Lung sounds diminished. Consider changing to soft diet and SLP eval to prevent aspiration in the future. RN will keep Pt on soft foods for the night until MD can assess in AM.

## 2020-07-03 NOTE — Progress Notes (Signed)
Physical Therapy Treatment Patient Details Name: Jacob Rich MRN: [id number] DOB: 12-03-1930 Today's Date: 07/03/2020    History of Present Illness 86 y.o. male with medical history significant of dementia. Presenting with 5 days of lethargy and poor appetite. Hx is from his wife. He was recently in the hospital 10/24-10/26/21 for seizures and bronchitis.  He was fine for a couple of days, but on Thursday he started becoming more lethargic, decreased p.o. intake, weakness.    PT Comments    Patient progressing with ambulation distance this session.  Still with ataxic tendency with wide BOS, pushing walker out and festinating.  Patient continues with confusion.  Appropriate for SNF level rehab.  Pt to continue to follow acutely.   Follow Up Recommendations  Supervision/Assistance - 24 hour;Supervision for mobility/OOB;SNF     Equipment Recommendations  Rolling walker with 5" wheels    Recommendations for Other Services       Precautions / Restrictions Precautions Precautions: Fall Restrictions Weight Bearing Restrictions: No    Mobility  Bed Mobility               General bed mobility comments: up in recliner  Transfers Overall transfer level: Needs assistance Equipment used: Rolling walker (2 wheeled) Transfers: Sit to/from Stand Sit to Stand: Min assist         General transfer comment: increased time, assist for hand placement on armrests  Ambulation/Gait Ambulation/Gait assistance: Min assist;Mod assist Gait Distance (Feet): 24 Feet Assistive device: Rolling walker (2 wheeled) Gait Pattern/deviations: Step-to pattern;Step-through pattern;Decreased stride length;Festinating;Wide base of support;Trunk flexed;Ataxic     General Gait Details: assist for walker proximity, safety, increased time with festinating tendency, sliding L foot more than R and leaning over RW; assist to manage walker for all turns   Stairs             Wheelchair Mobility     Modified Rankin (Stroke Patients Only)       Balance Overall balance assessment: Needs assistance Sitting-balance support: Feet supported Sitting balance-Leahy Scale: Fair     Standing balance support: Bilateral upper extremity supported Standing balance-Leahy Scale: Poor Standing balance comment: assist for balance in standing with bilateral UE support                            Cognition Arousal/Alertness: Awake/alert Behavior During Therapy: WFL for tasks assessed/performed Overall Cognitive Status: History of cognitive impairments - at baseline                                 General Comments: pleasant, but admits confusion      Exercises      General Comments General comments (skin integrity, edema, etc.): assisted to bring lunch tray up and set up for pt as breakfast tray still in the room untouched      Pertinent Vitals/Pain Pain Assessment: Faces Faces Pain Scale: Hurts little more Pain Location: R shoulder Pain Descriptors / Indicators: Grimacing;Guarding Pain Intervention(s): Monitored during session;Repositioned;Heat applied    Home Living                      Prior Function            PT Goals (current goals can now be found in the care plan section) Progress towards PT goals: Progressing toward goals    Frequency    Min 2X/week  PT Plan Current plan remains appropriate    Co-evaluation              AM-PAC PT "6 Clicks" Mobility   Outcome Measure  Help needed turning from your back to your side while in a flat bed without using bedrails?: A Lot Help needed moving from lying on your back to sitting on the side of a flat bed without using bedrails?: A Lot Help needed moving to and from a bed to a chair (including a wheelchair)?: A Lot Help needed standing up from a chair using your arms (e.g., wheelchair or bedside chair)?: A Little Help needed to walk in hospital room?: A Lot Help needed  climbing 3-5 steps with a railing? : Total 6 Click Score: 12    End of Session   Activity Tolerance: Patient limited by fatigue Patient left: in chair;with call bell/phone within reach;with chair alarm set   PT Visit Diagnosis: Difficulty in walking, not elsewhere classified (R26.2)     Time: 1700-1749 PT Time Calculation (min) (ACUTE ONLY): 24 min  Charges:  $Gait Training: 8-22 mins $Therapeutic Activity: 8-22 mins                     Sheran Lawless, PT Acute Rehabilitation Services Pager:[phone] Office:[phone] 07/03/2020    Elray Mcgregor 07/03/2020, 12:49 PM

## 2020-07-03 NOTE — TOC Progression Note (Signed)
Transition of Care [facility]) - Progression Note    Patient Details  Name: Vipul Cafarelli MRN: [id number] Date of Birth: 21-Nov-1930  [facility]) CM/SW Contact  Clearance Coots, LCSW Phone Number: 07/03/2020, 12:33 PM  Clinical Narrative:    CSW reached out to the Admissions Coordinator Cynthia,insurance authorization is still pending. CSW left a voicemail for the patient spouse.    Expected Discharge Plan: Skilled Nursing Facility Barriers to Discharge: Insurance Authorization  Expected Discharge Plan and Services Expected Discharge Plan: Skilled Nursing Facility   Discharge Planning Services: CM Consult Post Acute Care Choice: Skilled Nursing Facility Living arrangements for the past 2 months: Single Family Home Expected Discharge Date:  (unknown)                                     Social Determinants of Health (SDOH) Interventions    Readmission Risk Interventions No flowsheet data found.

## 2020-07-04 NOTE — Progress Notes (Signed)
SLP Cancellation Note  Patient Details Name: Jacob Rich MRN: [id number] DOB: Jan 15, 1931   Cancelled treatment:       Reason Eval/Treat Not Completed: Patient declined, no reason specified. Despite encouragement, pt adamantly refused to accept PO trials. Pt swatted at SLP and raised his voice. Given notes regarding choking on solid food (chicken) will downgrade diet to dys 2 to maximize swallow safety. SLP will continue efforts to evaluate as pt tolerates.  Suhayb Anzalone B. Murvin Natal, [facility], CCC-SLP Speech Language Pathologist Office: [phone] Pager: [phone]  Leigh Aurora 07/04/2020, 12:21 PM

## 2020-07-04 NOTE — TOC Progression Note (Signed)
Transition of Care [facility]) - Progression Note    Patient Details  Name: Jacob Rich MRN: [id number] Date of Birth: 06-15-1931  Transition of Care [facility]) CM/SW Contact  Clearance Coots, LCSW Phone Number: 07/04/2020, 1:37 PM  Clinical Narrative:    CSW reached out to SNF staff-Cynthia. She reports the insurance authorization is still pending. Insurance has not requested additional clinical information.    Expected Discharge Plan: Skilled Nursing Facility Barriers to Discharge: Insurance Authorization  Expected Discharge Plan and Services Expected Discharge Plan: Skilled Nursing Facility   Discharge Planning Services: CM Consult Post Acute Care Choice: Skilled Nursing Facility Living arrangements for the past 2 months: Single Family Home Expected Discharge Date: 07/03/20                                     Social Determinants of Health (SDOH) Interventions    Readmission Risk Interventions No flowsheet data found.

## 2020-07-04 NOTE — Progress Notes (Signed)
Patient remains in the hospital, waiting to go to a skilled nursing facility. Overnight events noted.  He had a choking episode with chicken.  Called speech therapist and he was not cooperative.  Patient has clear voice.  Supervised feeding, changed to dysphagia 2 diet.  Plan: Transfer to skilled nursing facility when bed available.  Physical Exam Constitutional:      Comments: Sleepy.  Chronically sick looking.  Not in any distress.  On interested to talk.  HENT:     Head: Atraumatic.  Cardiovascular:     Rate and Rhythm: Regular rhythm.  Pulmonary:     Breath sounds: Normal breath sounds.  Neurological:     General: No focal deficit present.     Mental Status: Mental status is at baseline.   Total time spent: 15 minutes.

## 2020-07-05 LAB — RESPIRATORY PANEL BY RT PCR (FLU A&B, COVID)
Influenza A by PCR: NEGATIVE
Influenza B by PCR: NEGATIVE
SARS Coronavirus 2 by RT PCR: NEGATIVE

## 2020-07-05 NOTE — Progress Notes (Signed)
Patient remains in the hospital, waiting to go to a skilled nursing facility. Overnight, he has been more angry with the staff but not getting out of the bed. He does not like to be disturbed.  Plan: Transfer to skilled nursing facility today.  Diet changed to dysphagia 2 diet. His behavior is due to dementia.  Needs all time fall precautions and delirium precautions   Physical Exam Constitutional:      Comments: Sleepy.  Chronically sick looking.  Not in any distress.  On interested to talk.  HENT:     Head: Atraumatic.  Cardiovascular:     Rate and Rhythm: Regular rhythm.  Pulmonary:     Breath sounds: Normal breath sounds.  Neurological:     General: No focal deficit present.     Mental Status: Mental status is at baseline.   Total time spent: 15 minutes.

## 2020-07-05 NOTE — TOC Transition Note (Addendum)
Transition of Care [facility]) - CM/SW Discharge Note   Patient Details  Name: Jacob Rich MRN: [id number] Date of Birth: [birth year]  [facility]) CM/SW Contact:  Clearance Coots, LCSW Phone Number: 07/05/2020, 12:56 PM   Clinical Narrative:    CSW reached out to insurance company on behalf of SNF to follow up with insurance authorization. Approval provided [phone] start 11/17-next review 11/24-Call reference# [id number]. Rapid covid test ordered.  [facility] ready to accept patient.  Spouse to complete consent forms.   Nurse call report: [phone]   Final next level of care: Skilled Nursing Facility Barriers to Discharge: Barriers Resolved   Patient Goals and CMS Choice     Choice offered to / list presented to : Patient, Spouse  Discharge Placement                       Discharge Plan and Services   Discharge Planning Services: CM Consult Post Acute Care Choice: Skilled Nursing Facility                               Social Determinants of Health (SDOH) Interventions     Readmission Risk Interventions No flowsheet data found.

## 2020-07-05 NOTE — Discharge Summary (Signed)
Physician Discharge Summary  Jacob Rich WUJ:[id number] DOB: 19-Mar-1931 DOA: 06/26/2020  PCP: Renaye Rakers, MD  Admit date: 06/26/2020 Discharge date: 07/05/2020  Admitted From: Home Disposition: Skilled nursing facility  Recommendations for Outpatient Follow-up:  1. Follow up with PCP in 1-2 weeks  Home Health: Not applicable Equipment/Devices: Not applicable  Discharge Condition: Stable CODE STATUS: DNR Diet recommendation: dysphagia 2 diet   Discharge summary: 86 year old gentleman with advanced dementia, recent history of seizure, chronic bronchitis.  He was brought from home where he was found to be lethargic, sleeping all the time, difficulty walking and progressive debility.  In the emergency room his work-up was largely unremarkable.  He had poor appetite.  Initial UA was abnormal however subsequent urine culture was negative.  Was admitted to the hospital with advanced debility and ambulatory dysfunction.  Presumed UTI on admission: Ruled out.  Urine cultures were negative.  Recent CT scan with no evidence of GU abnormalities.  No post void residual.  Currently urinating well and asymptomatic.  Progressive debility with physical deconditioning, progressive dementia: Behavior is well controlled.  On admission, he was given a dose of Haldol.  He has not used any symptom control medication for last 5 days.  His behavior is well controlled and not needing any additional medication support. Patient is very deconditioned and he will benefit with inpatient therapies so referred to SNF. Patient is on donepezil that he will continue.  Acute kidney injury: Present on admission.  Treated with IV fluids and improved.  Now he has adequate oral intake.  Patient is medically stabilized.  Transfer to a skilled level of care when bed is available.   Discharge Diagnoses:  Active Problems:   UTI (urinary tract infection)   Encephalopathy    Discharge Instructions  Discharge Instructions     Diet general   Complete by: As directed    Increase activity slowly   Complete by: As directed      Allergies as of 07/05/2020   No Known Allergies     Medication List    TAKE these medications   acetaminophen 325 MG tablet Commonly known as: TYLENOL Take 2 tablets (650 mg total) by mouth every 6 (six) hours as needed for mild pain (or Fever >/= 101).   donepezil 10 MG tablet Commonly known as: ARICEPT Take 1 tablet (10 mg total) by mouth at bedtime. What changed: when to take this   folic acid 1 MG tablet Commonly known as: FOLVITE Take 1 mg by mouth at bedtime.   multivitamins with iron Tabs tablet Take 1 tablet by mouth daily.   pantoprazole 40 MG tablet Commonly known as: PROTONIX Take 1 tablet (40 mg total) by mouth daily.   VITAMIN B-12 PO Take 1 tablet by mouth daily with lunch.       Contact information for after-discharge care    Destination    [facility] .   Service: Skilled Nursing Contact information: [address] [place] [address] [phone]                 No Known Allergies  Consultations:  None   Procedures/Studies: EEG  Result Date: 06/13/2020 Charlsie Quest, MD     06/13/2020  8:27 AM Patient Name: Jacob Rich MRN: [id number] Epilepsy Attending: Charlsie Quest Referring Physician/Provider: Dr. Midge Minium Date: 06/12/2020 Duration: 23.17 mins Patient history: 86 year old male with history of advanced dementia who presented after persistent nausea and vomiting as well as an episode of whole body  shaking.  EEG to evaluate for seizures. Level of alertness: Awake, asleep AEDs during EEG study: None Technical aspects: This EEG study was done with scalp electrodes positioned according to the 10-20 International system of electrode placement. Electrical activity was acquired at a sampling rate of 500Hz  and reviewed with a high frequency filter of 70Hz  and a low frequency filter of 1Hz . EEG data  were recorded continuously and digitally stored. Description: No clear posterior dominant rhythm was seen.  Sleep was characterized by vertex waves, sleep spindles (12 to 14 Hz), maximal frontocentral region. EEG showed continuous generalized 3 to 6 Hz theta-delta slowing. Hyperventilation and photic stimulation were not performed.   ABNORMALITY -Continuous slow, generalized IMPRESSION: This study is suggestive of moderate diffuse encephalopathy, nonspecific etiology. No seizures or epileptiform discharges were seen throughout the recording.   CT Head Wo Contrast  Result Date: 06/11/2020 CLINICAL DATA:  Fever and vomiting. EXAM: CT HEAD WITHOUT CONTRAST TECHNIQUE: Contiguous axial images were obtained from the base of the skull through the vertex without intravenous contrast. COMPARISON:  September 08, 2019 FINDINGS: Brain: There is mild cerebral atrophy with widening of the extra-axial spaces and ventricular dilatation. There are areas of decreased attenuation within the white matter tracts of the supratentorial brain, consistent with microvascular disease changes. Vascular: No hyperdense vessel or unexpected calcification. Skull: Normal. Negative for fracture or focal lesion. Sinuses/Orbits: No acute finding. Other: Mild scalp soft tissue swelling is seen along the vertex on the right. IMPRESSION: Generalized cerebral atrophy, without evidence of acute intracranial abnormality. Electronically Signed   By: Charlsie Quest M.D.   On: 06/11/2020 19:58   CT Angio Chest PE W and/or Wo Contrast  Result Date: 06/27/2020 CLINICAL DATA:  Increasingly lethargic for 3 days EXAM: CT ANGIOGRAPHY CHEST WITH CONTRAST TECHNIQUE: Multidetector CT imaging of the chest was performed using the standard protocol during bolus administration of intravenous contrast. Multiplanar CT image reconstructions and MIPs were obtained to evaluate the vascular anatomy. CONTRAST:  60mL OMNIPAQUE IOHEXOL 350 MG/ML SOLN  COMPARISON:  Radiograph 06/26/2020 FINDINGS: Cardiovascular: Satisfactory opacification the pulmonary arteries to the segmental level. No pulmonary artery filling defects are identified. Central pulmonary arteries are normal caliber. Normal heart size. No pericardial effusion. Three-vessel coronary artery atherosclerosis is noted. Suboptimal opacification of the aorta. The aortic root is suboptimally assessed given cardiac pulsation artifact. Atherosclerotic plaque within the normal caliber aorta. No clear acute luminal abnormality. No periaortic stranding or hemorrhage. Shared origin of the brachiocephalic and left common carotid arteries. Proximal great vessels are moderately calcified but otherwise free of acute abnormality. No major venous abnormality. Mediastinum/Nodes: No mediastinal fluid or gas. Normal thyroid gland and thoracic inlet. No acute abnormality of the trachea or esophagus. No worrisome mediastinal, hilar or axillary adenopathy. Lungs/Pleura: There is some mild vascular redistribution with interlobular septal thickening and some dependent areas of ground-glass opacity which could reflect a combination of interstitial edema and atelectasis with more patchy opacity towards a posterior costophrenic sulci. Some additional areas of fine subpleural reticular change are present throughout both lungs incompletely assessed on this exam and likely accentuated by imaging during exhalation for the angiographic technique. Upper Abdomen: Multiple calcified gallstones layering within the otherwise normal gallbladder. No acute abnormalities present in the visualized portions of the upper abdomen. Musculoskeletal: No acute osseous abnormality or suspicious osseous lesion. Multilevel degenerative changes are present in the imaged portions of the spine. Additional degenerative changes in the shoulders. No worrisome chest wall lesions. Review of the MIP images  confirms the above findings. IMPRESSION: 1. No evidence  of acute pulmonary artery filling defects to suggest pulmonary embolism. 2. Septal thickening and vascular redistribution suggesting some interstitial edema superimposed on some atelectatic changes towards the lung bases. Infection is less favored. 3. Additional areas of fine subpleural reticular change are present throughout both lungs incompletely assessed on this exam. Consider outpatient evaluation with HRCT. 4. Cholelithiasis without evidence of acute cholecystitis. 5. Three-vessel coronary artery atherosclerosis. 6. Aortic Atherosclerosis (ICD10-I70.0). Electronically Signed   By: Kreg Shropshire M.D.   On: 06/27/2020 01:46   CT ABDOMEN PELVIS W CONTRAST  Result Date: 06/11/2020 CLINICAL DATA:  Vomiting and fever. EXAM: CT ABDOMEN AND PELVIS WITH CONTRAST TECHNIQUE: Multidetector CT imaging of the abdomen and pelvis was performed using the standard protocol following bolus administration of intravenous contrast. CONTRAST:  OMNIPAQUE IOHEXOL 300 MG/ML  SOLN COMPARISON:  None. FINDINGS: Lower chest: Mild to moderate severity scarring and fibrosis is seen involving the bilateral lung bases. Hepatobiliary: No focal liver abnormality is seen. Multiple subcentimeter gallstones are seen within the gallbladder lumen without evidence of gallbladder wall thickening or biliary dilatation. Pancreas: Unremarkable. No pancreatic ductal dilatation or surrounding inflammatory changes. Spleen: Normal in size without focal abnormality. Adrenals/Urinary Tract: Adrenal glands are unremarkable. Kidneys are normal, without renal calculi, focal lesion, or hydronephrosis. Bladder is unremarkable. Stomach/Bowel: There is a small hiatal hernia. Appendix appears normal. No evidence of bowel dilatation. Noninflamed diverticula are seen throughout the large bowel. Vascular/Lymphatic: There is moderate to marked severity calcification of the abdominal aorta and bilateral common iliac arteries, without evidence of aneurysmal  dilatation. No enlarged abdominal or pelvic lymph nodes. Reproductive: Prostate gland is moderately enlarged. Moderate severity prostate calcification is also seen. Other: No abdominal wall hernia or abnormality. No abdominopelvic ascites. Musculoskeletal: A very small amount of air is seen within the subcutaneous fat along the anterior aspect of the lower pelvic wall on the right. Chronic lateral eighth and ninth right rib fractures are seen. Multilevel degenerative changes seen throughout the lumbar spine. IMPRESSION: 1. Cholelithiasis without evidence of acute cholecystitis. 2. Colonic diverticulosis. 3. Small hiatal hernia. 4. Chronic lateral eighth and ninth right rib fractures. 5. Aortic atherosclerosis. 6. Enlarged prostate gland. Aortic Atherosclerosis (ICD10-I70.0). Electronically Signed   By: Aram Candela M.D.   On: 06/11/2020 23:36   DG Chest Portable 1 View  Result Date: 06/26/2020 CLINICAL DATA:  Shortness of breath EXAM: PORTABLE CHEST 1 VIEW COMPARISON:  June 11, 2020 FINDINGS: The heart size and mediastinal contours are within normal limits. Again noted are mildly increased interstitial markings seen at both lung bases, likely chronic lung changes versus atelectasis. No new airspace consolidation or pleural effusion. No acute osseous abnormality. IMPRESSION: Stable increased interstitial markings of both lung bases which could be due to atelectasis and/or chronic changes. Electronically Signed   By: Jonna Clark M.D.   On: 06/26/2020 18:19   DG Chest Port 1 View  Result Date: 06/11/2020 CLINICAL DATA:  Vomiting. EXAM: PORTABLE CHEST 1 VIEW COMPARISON:  Jan 04, 2020 FINDINGS: Mild, stable hazy areas of atelectasis are seen within the bilateral lung bases, left greater than right. There is no evidence of a pleural effusion or pneumothorax. The cardiac silhouette is mildly enlarged and unchanged in size. Multilevel degenerative changes seen throughout the thoracic spine. IMPRESSION:  Mild, stable bibasilar atelectasis, left greater than right. Electronically Signed   By: Aram Candela M.D.   On: 06/11/2020 17:26   (Echo, Carotid, EGD, Colonoscopy, ERCP)  Subjective: Patient seen and examined. He refused care and refused to eat in the morning. He was angry with the staff today. No other overnight events.   Discharge Exam: Vitals:   07/04/20 1402 07/04/20 2150  BP: 111/63 115/67  Pulse: 65 74  Resp: 18 16  Temp:    SpO2: 100% 99%   Vitals:   07/04/20 0149 07/04/20 0523 07/04/20 1402 07/04/20 2150  BP: 139/70 136/67 111/63 115/67  Pulse: 86 64 65 74  Resp: 18 18 18 16   Temp: (!) 97.4 F (36.3 C) 97.6 F (36.4 C)    TempSrc: Oral Oral    SpO2: 98% 98% 100% 99%  Weight:      Height:        General: Pt is alert, awake, not in acute distress Patient is alert and awake but confused. He is alert oriented x1.  He is awake and can keep up conversation. Cardiovascular: RRR, S1/S2 +, no rubs, no gallops Respiratory: CTA bilaterally, no wheezing, no rhonchi Abdominal: Soft, NT, ND, bowel sounds + Extremities: no edema, no cyanosis    The results of significant diagnostics from this hospitalization (including imaging, microbiology, ancillary and laboratory) are listed below for reference.     Microbiology: Recent Results (from the past 240 hour(s))  Respiratory Panel by RT PCR (Flu A&B, Covid) - Nasopharyngeal Swab     Status: None   Collection Time: 06/26/20  8:56 PM   Specimen: Nasopharyngeal Swab  Result Value Ref Range Status   SARS Coronavirus 2 by RT PCR NEGATIVE NEGATIVE Final    Comment: (NOTE) SARS-CoV-2 target nucleic acids are NOT DETECTED.  The SARS-CoV-2 RNA is generally detectable in upper respiratoy specimens during the acute phase of infection. The lowest concentration of SARS-CoV-2 viral copies this assay can detect is 131 copies/mL. A negative result does not preclude SARS-Cov-2 infection and should not be used as the sole basis  for treatment or other patient management decisions. A negative result may occur with  improper specimen collection/handling, submission of specimen other than nasopharyngeal swab, presence of viral mutation(s) within the areas targeted by this assay, and inadequate number of viral copies (<131 copies/mL). A negative result must be combined with clinical observations, patient history, and epidemiological information. The expected result is Negative.  Fact Sheet for Patients:  https://www.moore.com/https://www.fda.gov/media/142436/download  Fact Sheet for Healthcare Providers:  https://www.young.biz/https://www.fda.gov/media/142435/download  This test is no t yet approved or cleared by the [place] FDA and  has been authorized for detection and/or diagnosis of SARS-CoV-2 by FDA under an Emergency Use Authorization (EUA). This EUA will remain  in effect (meaning this test can be used) for the duration of the COVID-19 declaration under Section 564(b)(1) of the Act, 21 U.S.C. section 360bbb-3(b)(1), unless the authorization is terminated or revoked sooner.     Influenza A by PCR NEGATIVE NEGATIVE Final   Influenza B by PCR NEGATIVE NEGATIVE Final    Comment: (NOTE) The Xpert Xpress SARS-CoV-2/FLU/RSV assay is intended as an aid in  the diagnosis of influenza from Nasopharyngeal swab specimens and  should not be used as a sole basis for treatment. Nasal washings and  aspirates are unacceptable for Xpert Xpress SARS-CoV-2/FLU/RSV  testing.  Fact Sheet for Patients: https://www.moore.com/https://www.fda.gov/media/142436/download  Fact Sheet for Healthcare Providers: https://www.young.biz/https://www.fda.gov/media/142435/download  This test is not yet approved or cleared by the [place] FDA and  has been authorized for detection and/or diagnosis of SARS-CoV-2 by  FDA under an Emergency Use Authorization (EUA). This EUA will remain  in  effect (meaning this test can be used) for the duration of the  Covid-19 declaration under Section 564(b)(1) of the Act, 21   U.S.C. section 360bbb-3(b)(1), unless the authorization is  terminated or revoked. Performed at [facility], [address], [place], [place] [address]   Urine culture     Status: None   Collection Time: 06/27/20  9:28 AM   Specimen: Urine, Catheterized  Result Value Ref Range Status   Specimen Description   Final    URINE, CATHETERIZED Performed at [facility], [address], [place], [place] [address]    Special Requests   Final    NONE Performed at [facility], [address], [place], [place] [address]    Culture   Final    NO GROWTH Performed at [facility], [address] [place], [place] [address]    Report Status 06/28/2020 FINAL  Final     Labs: BNP (last 3 results) No results for input(s): BNP in the last 8760 hours. Basic Metabolic Panel: No results for input(s): NA, K, CL, CO2, GLUCOSE, BUN, CREATININE, CALCIUM, MG, PHOS in the last 168 hours. Liver Function Tests: No results for input(s): AST, ALT, ALKPHOS, BILITOT, PROT, ALBUMIN in the last 168 hours. No results for input(s): LIPASE, AMYLASE in the last 168 hours. No results for input(s): AMMONIA in the last 168 hours. CBC: No results for input(s): WBC, NEUTROABS, HGB, HCT, MCV, PLT in the last 168 hours. Cardiac Enzymes: No results for input(s): CKTOTAL, CKMB, CKMBINDEX, TROPONINI in the last 168 hours. BNP: Invalid input(s): POCBNP CBG: No results for input(s): GLUCAP in the last 168 hours. D-Dimer No results for input(s): DDIMER in the last 72 hours. Hgb A1c No results for input(s): HGBA1C in the last 72 hours. Lipid Profile No results for input(s): CHOL, HDL, LDLCALC, TRIG, CHOLHDL, LDLDIRECT in the last 72 hours. Thyroid function studies No results for input(s): TSH, T4TOTAL, T3FREE, THYROIDAB in the last 72 hours.  Invalid input(s): FREET3 Anemia work up No results for input(s): VITAMINB12, FOLATE, FERRITIN, TIBC, IRON,  RETICCTPCT in the last 72 hours. Urinalysis    Component Value Date/Time   COLORURINE YELLOW 06/27/2020 0928   APPEARANCEUR CLEAR 06/27/2020 0928   LABSPEC >1.046 (H) 06/27/2020 0928   PHURINE 5.0 06/27/2020 0928   GLUCOSEU NEGATIVE 06/27/2020 0928   HGBUR LARGE (A) 06/27/2020 0928   BILIRUBINUR NEGATIVE 06/27/2020 0928   KETONESUR 20 (A) 06/27/2020 0928   PROTEINUR 100 (A) 06/27/2020 0928   NITRITE NEGATIVE 06/27/2020 0928   LEUKOCYTESUR MODERATE (A) 06/27/2020 0928   Sepsis Labs Invalid input(s): PROCALCITONIN,  WBC,  LACTICIDVEN Microbiology Recent Results (from the past 240 hour(s))  Respiratory Panel by RT PCR (Flu A&B, Covid) - Nasopharyngeal Swab     Status: None   Collection Time: 06/26/20  8:56 PM   Specimen: Nasopharyngeal Swab  Result Value Ref Range Status   SARS Coronavirus 2 by RT PCR NEGATIVE NEGATIVE Final    Comment: (NOTE) SARS-CoV-2 target nucleic acids are NOT DETECTED.  The SARS-CoV-2 RNA is generally detectable in upper respiratoy specimens during the acute phase of infection. The lowest concentration of SARS-CoV-2 viral copies this assay can detect is 131 copies/mL. A negative result does not preclude SARS-Cov-2 infection and should not be used as the sole basis for treatment or other patient management decisions. A negative result may occur with  improper specimen collection/handling, submission of specimen other than nasopharyngeal swab, presence of viral mutation(s) within  the areas targeted by this assay, and inadequate number of viral copies (<131 copies/mL). A negative result must be combined with clinical observations, patient history, and epidemiological information. The expected result is Negative.  Fact Sheet for Patients:  https://www.moore.com/  Fact Sheet for Healthcare Providers:  https://www.young.biz/  This test is no t yet approved or cleared by the [place] FDA and  has been authorized  for detection and/or diagnosis of SARS-CoV-2 by FDA under an Emergency Use Authorization (EUA). This EUA will remain  in effect (meaning this test can be used) for the duration of the COVID-19 declaration under Section 564(b)(1) of the Act, 21 U.S.C. section 360bbb-3(b)(1), unless the authorization is terminated or revoked sooner.     Influenza A by PCR NEGATIVE NEGATIVE Final   Influenza B by PCR NEGATIVE NEGATIVE Final    Comment: (NOTE) The Xpert Xpress SARS-CoV-2/FLU/RSV assay is intended as an aid in  the diagnosis of influenza from Nasopharyngeal swab specimens and  should not be used as a sole basis for treatment. Nasal washings and  aspirates are unacceptable for Xpert Xpress SARS-CoV-2/FLU/RSV  testing.  Fact Sheet for Patients: https://www.moore.com/  Fact Sheet for Healthcare Providers: https://www.young.biz/  This test is not yet approved or cleared by the [place] FDA and  has been authorized for detection and/or diagnosis of SARS-CoV-2 by  FDA under an Emergency Use Authorization (EUA). This EUA will remain  in effect (meaning this test can be used) for the duration of the  Covid-19 declaration under Section 564(b)(1) of the Act, 21  U.S.C. section 360bbb-3(b)(1), unless the authorization is  terminated or revoked. Performed at [facility], [address], [place], [place] [address]   Urine culture     Status: None   Collection Time: 06/27/20  9:28 AM   Specimen: Urine, Catheterized  Result Value Ref Range Status   Specimen Description   Final    URINE, CATHETERIZED Performed at [facility], [address] [place], [place] [address]    Special Requests   Final    NONE Performed at [facility], [address] [place], [place] [address]    Culture   Final    NO GROWTH Performed at [facility], [address], [place], [place] [address]    Report  Status 06/28/2020 FINAL  Final     Time coordinating discharge:  32 minutes  SIGNED:   Dorcas Carrow, MD  [facility] 07/05/2020, 12:57 PM

## 2020-07-05 NOTE — Plan of Care (Signed)
Report was called to April at [phone] [facility] all questions were answered and [facility] will transport patient to facility. Package was sent with [facility].

## 2021-03-15 ENCOUNTER — Encounter (HOSPITAL_COMMUNITY): Payer: Self-pay | Admitting: Emergency Medicine

## 2021-03-15 ENCOUNTER — Emergency Department (HOSPITAL_COMMUNITY)
Admission: EM | Admit: 2021-03-15 | Discharge: 2021-03-16 | Disposition: A | Discharge status: Home / Self Care | Payer: Medicare (Managed Care) | Source: Home / Self Care | Attending: Emergency Medicine | Admitting: Emergency Medicine

## 2021-03-15 ENCOUNTER — Emergency Department (HOSPITAL_COMMUNITY): Payer: Medicare (Managed Care)

## 2021-03-15 DIAGNOSIS — Y900 Blood alcohol level of less than 20 mg/100 ml: Secondary | ICD-10-CM | POA: Insufficient documentation

## 2021-03-15 DIAGNOSIS — N289 Disorder of kidney and ureter, unspecified: Secondary | ICD-10-CM | POA: Diagnosis not present

## 2021-03-15 DIAGNOSIS — R410 Disorientation, unspecified: Secondary | ICD-10-CM | POA: Insufficient documentation

## 2021-03-15 DIAGNOSIS — R4781 Slurred speech: Secondary | ICD-10-CM | POA: Insufficient documentation

## 2021-03-15 DIAGNOSIS — F0390 Unspecified dementia without behavioral disturbance: Secondary | ICD-10-CM | POA: Insufficient documentation

## 2021-03-15 DIAGNOSIS — R262 Difficulty in walking, not elsewhere classified: Secondary | ICD-10-CM | POA: Insufficient documentation

## 2021-03-15 DIAGNOSIS — Z79899 Other long term (current) drug therapy: Secondary | ICD-10-CM | POA: Insufficient documentation

## 2021-03-15 DIAGNOSIS — F0391 Unspecified dementia with behavioral disturbance: Secondary | ICD-10-CM

## 2021-03-15 DIAGNOSIS — Z20822 Contact with and (suspected) exposure to covid-19: Secondary | ICD-10-CM | POA: Insufficient documentation

## 2021-03-15 DIAGNOSIS — A419 Sepsis, unspecified organism: Secondary | ICD-10-CM | POA: Diagnosis not present

## 2021-03-15 LAB — CBC WITH DIFFERENTIAL/PLATELET
Abs Immature Granulocytes: 0.01 10*3/uL (ref 0.00–0.07)
Basophils Absolute: 0 10*3/uL (ref 0.0–0.1)
Basophils Relative: 1 %
Eosinophils Absolute: 0 10*3/uL (ref 0.0–0.5)
Eosinophils Relative: 0 %
HCT: 38.6 % — ABNORMAL LOW (ref 39.0–52.0)
Hemoglobin: 12.9 g/dL — ABNORMAL LOW (ref 13.0–17.0)
Immature Granulocytes: 0 %
Lymphocytes Relative: 8 %
Lymphs Abs: 0.4 10*3/uL — ABNORMAL LOW (ref 0.7–4.0)
MCH: 32 pg (ref 26.0–34.0)
MCHC: 33.4 g/dL (ref 30.0–36.0)
MCV: 95.8 fL (ref 80.0–100.0)
Monocytes Absolute: 0.5 10*3/uL (ref 0.1–1.0)
Monocytes Relative: 9 %
Neutro Abs: 4.6 10*3/uL (ref 1.7–7.7)
Neutrophils Relative %: 82 %
Platelets: 205 10*3/uL (ref 150–400)
RBC: 4.03 MIL/uL — ABNORMAL LOW (ref 4.22–5.81)
RDW: 12.4 % (ref 11.5–15.5)
WBC: 5.6 10*3/uL (ref 4.0–10.5)
nRBC: 0 % (ref 0.0–0.2)

## 2021-03-15 LAB — COMPREHENSIVE METABOLIC PANEL
ALT: 12 U/L (ref 0–44)
AST: 21 U/L (ref 15–41)
Albumin: 3.9 g/dL (ref 3.5–5.0)
Alkaline Phosphatase: 81 U/L (ref 38–126)
Anion gap: 8 (ref 5–15)
BUN: 18 mg/dL (ref 8–23)
CO2: 24 mmol/L (ref 22–32)
Calcium: 9 mg/dL (ref 8.9–10.3)
Chloride: 104 mmol/L (ref 98–111)
Creatinine, Ser: 1.48 mg/dL — ABNORMAL HIGH (ref 0.61–1.24)
GFR, Estimated: 45 mL/min — ABNORMAL LOW (ref >60)
Glucose, Bld: 111 mg/dL — ABNORMAL HIGH (ref 70–99)
Potassium: 3.9 mmol/L (ref 3.5–5.1)
Sodium: 136 mmol/L (ref 135–145)
Total Bilirubin: 1.2 mg/dL (ref 0.3–1.2)
Total Protein: 6.6 g/dL (ref 6.5–8.1)

## 2021-03-15 LAB — RESP PANEL BY RT-PCR (FLU A&B, COVID) ARPGX2
Influenza A by PCR: NEGATIVE
Influenza B by PCR: NEGATIVE
SARS Coronavirus 2 by RT PCR: NEGATIVE

## 2021-03-15 LAB — ETHANOL: Alcohol, Ethyl (B): <10 mg/dL (ref <10)

## 2021-03-15 MED ORDER — LORAZEPAM 2 MG/ML IJ SOLN
0.5000 mg | Freq: Once | INTRAMUSCULAR | Status: AC
  Administered 2021-03-15: 0.5 mg via INTRAVENOUS
  Filled 2021-03-15: qty 1

## 2021-03-15 NOTE — ED Triage Notes (Signed)
Pt BIB EMS from home. Wife unable to take care of pt due to worsening dementia and would like SNF placement. No complaints. A&o x 1 (self).

## 2021-03-15 NOTE — ED Notes (Signed)
Patient transported to X-ray & CT °

## 2021-03-15 NOTE — ED Provider Notes (Signed)
[facility] LONG EMERGENCY DEPARTMENT Provider Note  CSN: [id number] Arrival date & time: 03/15/21 2022    History Chief Complaint  Patient presents with   Dementia    Jacob Rich is a 87 y.o. male with history of dementia brought to the ED by EMS from home. Wife reports he was having more and more trouble walking during the day today. He has been having trouble eating today with slurred speech. She he was unable to sit on the cough and she was unable to get him into a wheelchair. They live in a mobile home and she has not been able to get him out of the house. His normal speech is clear but he is confused at baseline. He has not had a fever, cough, N/V/D. Appetite has been normal for him. He had a similar presentation in Nov 2021, was admitted for a few days and ultimately went to SNF for 30 days. Wife reports he was improved some when he got home and continued to get better until about Feb. He fell and hit his head in May and has been going downhill since then. Wife called Neurologist office today for help but has not heard back yet.   Patient is unable to provide any additional history.    Past Medical History:  Diagnosis Date   Dementia (HCC)    MRSA infection     Past Surgical History:  Procedure Laterality Date   MRSa     2006/2007   OTHER SURGICAL HISTORY     Dicverticulitis    Family History  Problem Relation Age of Onset   Glaucoma Mother     Social History   Tobacco Use   Smoking status: Never   Smokeless tobacco: Never  Substance Use Topics   Alcohol use: Not Currently    Comment: rare   Drug use: No     Home Medications Prior to Admission medications   Medication Sig Start Date End Date Taking? Authorizing Provider  acetaminophen (TYLENOL) 325 MG tablet Take 2 tablets (650 mg total) by mouth every 6 (six) hours as needed for mild pain (or Fever >/= 101). 01/06/20   Lonia Blood, MD  Cyanocobalamin (VITAMIN B-12 PO) Take 1 tablet by mouth daily with  lunch.    [provider]  donepezil (ARICEPT) 10 MG tablet Take 1 tablet (10 mg total) by mouth at bedtime. Patient taking differently: Take 10 mg by mouth daily.  04/16/16   Anson Fret, MD  folic acid (FOLVITE) 1 MG tablet Take 1 mg by mouth at bedtime.  08/18/19   [provider]  pantoprazole (PROTONIX) 40 MG tablet Take 1 tablet (40 mg total) by mouth daily. 06/14/20 07/14/20  Arrien, York Ram, MD     Allergies    Patient has no known allergies.   Review of Systems   Review of Systems Unable to assess due to mental status.    Physical Exam BP 114/89   Pulse 74   Temp 97.9 F (36.6 C) (Oral)   Resp 16   SpO2 99%   Physical Exam Vitals and nursing note reviewed.  Constitutional:      Appearance: Normal appearance.  HENT:     Head: Normocephalic and atraumatic.     Nose: Nose normal.     Mouth/Throat:     Mouth: Mucous membranes are dry.  Eyes:     Extraocular Movements: Extraocular movements intact.     Conjunctiva/sclera: Conjunctivae normal.  Cardiovascular:  Rate and Rhythm: Normal rate.  Pulmonary:     Effort: Pulmonary effort is normal.     Breath sounds: Normal breath sounds.  Abdominal:     General: Abdomen is flat.     Palpations: Abdomen is soft.     Tenderness: There is no abdominal tenderness.  Musculoskeletal:        General: No swelling. Normal range of motion.     Cervical back: Neck supple.  Skin:    General: Skin is warm and dry.  Neurological:     Mental Status: He is alert. He is disoriented.     Cranial Nerves: No cranial nerve deficit.     Sensory: No sensory deficit.     Motor: No weakness.  Psychiatric:     Comments: Calm when resting, but aggressive when attempting exam     ED Results / Procedures / Treatments   Labs (all labs ordered are listed, but only abnormal results are displayed) Labs Reviewed  COMPREHENSIVE METABOLIC PANEL - Abnormal; Notable for the following components:      Result  Value   Glucose, Bld 111 (*)    Creatinine, Ser 1.48 (*)    GFR, Estimated 45 (*)    All other components within normal limits  CBC WITH DIFFERENTIAL/PLATELET - Abnormal; Notable for the following components:   RBC 4.03 (*)    Hemoglobin 12.9 (*)    HCT 38.6 (*)    Lymphs Abs 0.4 (*)    All other components within normal limits  RESP PANEL BY RT-PCR (FLU A&B, COVID) ARPGX2  ETHANOL  URINALYSIS, ROUTINE W REFLEX MICROSCOPIC  RAPID URINE DRUG SCREEN, HOSP PERFORMED    EKG None  Radiology CT Head Wo Contrast  Result Date: 03/15/2021 CLINICAL DATA:  Altered mental status. EXAM: CT HEAD WITHOUT CONTRAST TECHNIQUE: Contiguous axial images were obtained from the base of the skull through the vertex without intravenous contrast. COMPARISON:  June 11, 2020 FINDINGS: Brain: There is mild cerebral atrophy with widening of the extra-axial spaces and ventricular dilatation. There are areas of decreased attenuation within the white matter tracts of the supratentorial brain, consistent with microvascular disease changes. Vascular: No hyperdense vessel or unexpected calcification. Skull: Normal. Negative for fracture or focal lesion. Sinuses/Orbits: No acute finding. Other: None. IMPRESSION: 1. Generalized cerebral atrophy. 2. No acute intracranial abnormality. Electronically Signed   By: Aram Candela M.D.   On: 03/15/2021 22:17   DG Chest Port 1 View  Result Date: 03/15/2021 CLINICAL DATA:  87 year old male with altered mental status EXAM: PORTABLE CHEST 1 VIEW COMPARISON:  Chest radiograph dated 06/26/2020 and CT dated 06/27/2020 FINDINGS: Bibasilar hazy densities, likely atelectasis. Atypical infiltrate is less likely but not excluded. Clinical correlation is recommended. There is mild chronic interstitial coarsening. No lobar consolidation, pleural effusion or pneumothorax. Top-normal cardiac size. No acute osseous pathology. IMPRESSION: Bibasilar hazy densities, likely atelectasis.  Electronically Signed   By: Elgie Collard M.D.   On: 03/15/2021 22:22    Procedures Procedures  Medications Ordered in the ED Medications  LORazepam (ATIVAN) injection 0.5 mg (has no administration in time range)     MDM Rules/Calculators/A&P MDM  Discussed with wife the typical process for initial medical evaluation. He has had a prior 30day hospital stay for similar presentation last year. Wife reports he does not qualify for medicaid. She does not currently have an ongoing relationship with a Home Health company but she does not feel she can continue taking care of him at home. Will start  with labs, if no acute process identified, will likely need to hold in the ED for SW evaluation in the AM.    ED Course  I have reviewed the triage vital signs and the nursing notes.  Pertinent labs & imaging results that were available during my care of the patient were reviewed by me and considered in my medical decision making (see chart for details).  Clinical Course as of 03/15/21 2347  Thu Mar 15, 2021  2225 CT head and CXR neg for acute process.  [CS]  2305 CMP with mild increase in Creatinine. Similar to admission in Nov.  [CS]  2315 CBC is unchanged from baseline [CS]  2347 Care of the patient signed out to Dr. Eudelia Bunch at the change of shift.  [CS]    Clinical Course User Index [CS] Pollyann Savoy, MD    Final Clinical Impression(s) / ED Diagnoses Final diagnoses:  None    Rx / DC Orders ED Discharge Orders     None        Pollyann Savoy, MD 03/15/21 [phone]

## 2021-03-16 ENCOUNTER — Inpatient Hospital Stay (HOSPITAL_COMMUNITY)
Admission: EM | Admit: 2021-03-16 | Discharge: 2021-03-23 | DRG: 871 | Disposition: A | Discharge status: Hospice | Payer: Medicare (Managed Care) | Attending: Internal Medicine | Admitting: Internal Medicine

## 2021-03-16 ENCOUNTER — Encounter (HOSPITAL_COMMUNITY): Payer: Self-pay | Admitting: Emergency Medicine

## 2021-03-16 DIAGNOSIS — N1832 Chronic kidney disease, stage 3b: Secondary | ICD-10-CM | POA: Diagnosis present

## 2021-03-16 DIAGNOSIS — R627 Adult failure to thrive: Secondary | ICD-10-CM | POA: Diagnosis present

## 2021-03-16 DIAGNOSIS — G9341 Metabolic encephalopathy: Secondary | ICD-10-CM | POA: Diagnosis present

## 2021-03-16 DIAGNOSIS — Z66 Do not resuscitate: Secondary | ICD-10-CM | POA: Diagnosis present

## 2021-03-16 DIAGNOSIS — J189 Pneumonia, unspecified organism: Secondary | ICD-10-CM

## 2021-03-16 DIAGNOSIS — Z8614 Personal history of Methicillin resistant Staphylococcus aureus infection: Secondary | ICD-10-CM

## 2021-03-16 DIAGNOSIS — A419 Sepsis, unspecified organism: Principal | ICD-10-CM | POA: Diagnosis present

## 2021-03-16 DIAGNOSIS — Z7401 Bed confinement status: Secondary | ICD-10-CM

## 2021-03-16 DIAGNOSIS — Z681 Body mass index (BMI) 19 or less, adult: Secondary | ICD-10-CM

## 2021-03-16 DIAGNOSIS — G9340 Encephalopathy, unspecified: Secondary | ICD-10-CM | POA: Diagnosis present

## 2021-03-16 DIAGNOSIS — D6959 Other secondary thrombocytopenia: Secondary | ICD-10-CM | POA: Diagnosis present

## 2021-03-16 DIAGNOSIS — E871 Hypo-osmolality and hyponatremia: Secondary | ICD-10-CM | POA: Diagnosis not present

## 2021-03-16 DIAGNOSIS — Z20822 Contact with and (suspected) exposure to covid-19: Secondary | ICD-10-CM | POA: Diagnosis present

## 2021-03-16 DIAGNOSIS — R0602 Shortness of breath: Secondary | ICD-10-CM

## 2021-03-16 DIAGNOSIS — F0391 Unspecified dementia with behavioral disturbance: Secondary | ICD-10-CM | POA: Diagnosis present

## 2021-03-16 DIAGNOSIS — N289 Disorder of kidney and ureter, unspecified: Secondary | ICD-10-CM

## 2021-03-16 DIAGNOSIS — D631 Anemia in chronic kidney disease: Secondary | ICD-10-CM | POA: Diagnosis present

## 2021-03-16 DIAGNOSIS — Z83511 Family history of glaucoma: Secondary | ICD-10-CM

## 2021-03-16 DIAGNOSIS — J690 Pneumonitis due to inhalation of food and vomit: Secondary | ICD-10-CM | POA: Diagnosis present

## 2021-03-16 DIAGNOSIS — R6520 Severe sepsis without septic shock: Secondary | ICD-10-CM | POA: Diagnosis present

## 2021-03-16 DIAGNOSIS — F03918 Unspecified dementia, unspecified severity, with other behavioral disturbance: Secondary | ICD-10-CM | POA: Diagnosis present

## 2021-03-16 DIAGNOSIS — Z79899 Other long term (current) drug therapy: Secondary | ICD-10-CM

## 2021-03-16 DIAGNOSIS — R509 Fever, unspecified: Secondary | ICD-10-CM

## 2021-03-16 LAB — URINALYSIS, ROUTINE W REFLEX MICROSCOPIC
Bacteria, UA: NONE SEEN
Bilirubin Urine: NEGATIVE
Glucose, UA: NEGATIVE mg/dL
Ketones, ur: NEGATIVE mg/dL
Nitrite: NEGATIVE
Protein, ur: NEGATIVE mg/dL
Specific Gravity, Urine: 1.02 (ref 1.005–1.030)
pH: 6 (ref 5.0–8.0)

## 2021-03-16 LAB — RAPID URINE DRUG SCREEN, HOSP PERFORMED
Amphetamines: NOT DETECTED
Barbiturates: NOT DETECTED
Benzodiazepines: NOT DETECTED
Cocaine: NOT DETECTED
Opiates: NOT DETECTED
Tetrahydrocannabinol: NOT DETECTED

## 2021-03-16 NOTE — ED Triage Notes (Signed)
Patient here from home, brought back after discharge with complaints of fatigue. Wife unable to care for patient.

## 2021-03-16 NOTE — Discharge Instructions (Addendum)
Our case manager has placed another referral to try to help with home health support and supplies, including a commode.  They have also placed a referral to an agency that can help with long-term placement options.  The placement process and search can take several days.

## 2021-03-16 NOTE — ED Notes (Signed)
Nurse [facility] attempted to complete the EKG ordered.  The patient refuses by swatting at the tech and refusing

## 2021-03-16 NOTE — ED Provider Notes (Signed)
Patient has been evaluated by her transition of care team.  They have discussed home health options with his wife.  Referrals were placed to an agency.  Bedside commode was ordered for home.  At this point they will assist with continued efforts for placement from home, but otherwise arrange for home health.  This may be a prolonged process.  His family is comfortable taking him home at this time.  Okay for discharge   Terald Sleeper, MD 03/16/21 1139

## 2021-03-16 NOTE — ED Notes (Signed)
PTAR SET UP FOR TRANSPORTATION TO HOME

## 2021-03-16 NOTE — ED Notes (Signed)
Attempted to call the patient's wife [name] [phone]  Daughter [phone] in regards to setting up transportation and to make sure the wife is at the home.  No answer when called both numbers

## 2021-03-16 NOTE — ED Notes (Signed)
rn request to complete EKG at this time writer attempt PT refuse

## 2021-03-16 NOTE — ED Notes (Signed)
Multiple attempts to obtain EKG. Pt continues to be uncooperative and attempting to hit staff. MD made aware.

## 2021-03-16 NOTE — Evaluation (Signed)
Physical Therapy Evaluation Patient Details Name: Jacob Rich MRN: [id number] DOB: 06/26/1931 Today's Date: 03/16/2021   History of Present Illness  Jacob Rich is a 87 y.o. male with history of dementia brought to the ED by EMS from home. Wife reports he was having more and more trouble walking during the day.  Clinical Impression  PT was asked to evaluate this patient knowing  that he will be returning to home and placed in long term care from home. Patient unble to follow any directions, total assist  to sit up on bed edge with poor balance responses. Patient becomes more agitated when given a bite of food and  a drink, pushes away and grips therapists  arm tightly. Patient now to be transported to home via [facility] Patient  does not require skilled PT intervention, appears  to require total care, may benefit from a hospital bed if does not have. PT signing off.    Follow Up Recommendations No PT follow up    Equipment Recommendations  None recommended by PT    Recommendations for Other Services       Precautions / Restrictions Precautions Precautions: Fall Precaution Comments: gets agitated      Mobility  Bed Mobility Overal bed mobility: Needs Assistance Bed Mobility: Supine to Sit;Sit to Supine     Supine to sit: Total assist Sit to supine: Total assist   General bed mobility comments: total times x attempts to sit up, second attempt able to get legs over bed edge, listing heavily to the left.    Transfers                 General transfer comment: NT  Ambulation/Gait                Stairs            Wheelchair Mobility    Modified Rankin (Stroke Patients Only)       Balance Overall balance assessment: Needs assistance Sitting-balance support: No upper extremity supported Sitting balance-Leahy Scale: Zero                                       Pertinent Vitals/Pain Pain Assessment: No/denies pain    Home Living  Family/patient expects to be discharged to:: Private residence   Available Help at Discharge: Family;Available 24 hours/day Type of Home: Mobile home           Additional Comments: inf from previous encounter, patient no to DC home via PTAR    Prior Function           Comments: recent decline in function     Hand Dominance        Extremity/Trunk Assessment   Upper Extremity Assessment Upper Extremity Assessment: Generalized weakness    Lower Extremity Assessment Lower Extremity Assessment: Generalized weakness    Cervical / Trunk Assessment Cervical / Trunk Assessment: Kyphotic  Communication   Communication:  (does not respond to any simple questions)  Cognition Arousal/Alertness: Awake/alert Behavior During Therapy: Restless;Agitated Overall Cognitive Status: No family/caregiver present to determine baseline cognitive functioning                                 General Comments: H/O dementia. becomes more agitated when offered bites of food and drink, knocks therapist hand away.      General  Comments      Exercises     Assessment/Plan    PT Assessment All further PT needs can be met in the next venue of care  PT Problem List Decreased strength;Decreased balance;Decreased cognition;Decreased activity tolerance       PT Treatment Interventions      PT Goals (Current goals can be found in the Care Plan section)  Acute Rehab PT Goals PT Goal Formulation: Patient unable to participate in goal setting    Frequency     Barriers to discharge        Co-evaluation               AM-PAC PT "6 Clicks" Mobility  Outcome Measure Help needed turning from your back to your side while in a flat bed without using bedrails?: Total Help needed moving from lying on your back to sitting on the side of a flat bed without using bedrails?: Total Help needed moving to and from a bed to a chair (including a wheelchair)?: Total Help needed  standing up from a chair using your arms (e.g., wheelchair or bedside chair)?: Total Help needed to walk in hospital room?: Total Help needed climbing 3-5 steps with a railing? : Total 6 Click Score: 6    End of Session   Activity Tolerance: Treatment limited secondary to agitation Patient left: in bed;with call bell/phone within reach   PT Visit Diagnosis: Difficulty in walking, not elsewhere classified (R26.2)    Time: 1410-1430 PT Time Calculation (min) (ACUTE ONLY): 20 min   Charges:   PT Evaluation $PT Eval Low Complexity: [place] Pager [phone] Office [phone]   Claretha Cooper 03/16/2021, 2:42 PM

## 2021-03-16 NOTE — ED Notes (Signed)
Pt uncooperative and attempting to hit staff when staff is providing care. MD made aware.

## 2021-03-16 NOTE — ED Notes (Signed)
Patient was given his lunch tray.

## 2021-03-16 NOTE — Progress Notes (Addendum)
Fax sent to [facility] intake for service of ordered [facility].  Vikki Ports [phone] [facility] will take case and [facility] will call wife to arrange start visit.

## 2021-03-16 NOTE — ED Notes (Signed)
Patient was given his dinner tray. I did attempt to feed him meatloaf and he turned his head. I offered him a drink and he again turned his head. Tray was left at his bedside within reach.

## 2021-03-16 NOTE — ED Notes (Signed)
Message sent to Marcelle Smiling, RN and Donato Schultz, SW in regards to discharge transportation means.  Patient has been discharged according to the notes and will be returning home with his wife

## 2021-03-16 NOTE — ED Notes (Signed)
Was able to reach [place], his wife and she will be at the home for the patient to be discharged.  I will set up transportation with PTAR

## 2021-03-16 NOTE — Progress Notes (Signed)
.  Transition of Care [facility]) - Emergency Department Mini Assessment   Patient Details  Name: Jacob Rich MRN: [id number] Date of Birth: 05-03-1931  Transition of Care [facility] CM/SW Contact:    Larrie Kass, LCSW Phone Number: 03/16/2021, 10:03 AM   Clinical Narrative:  CSW spoke with pt's wife Acel Natzke) , she reported pt lives with her in a trailer. Pt's wife stated pt went to rehab in December for about 30 days,when he got ou her was doing fine but he fell and has gotten worse. Pt's wife reported he couldn't walk or talk yesterday which prompted her to bring him to the ED. Pt wife reported pt does not want help from any one , and wouldn't even let her help him. Pt wife stated she wanted to get pt in rehab , due to it helping him a little bit before. CSW informed pt's wife pt will need a long term placement as pt is progressively getting worse. CSW informed pt's wife the hospital will not be able to place hm in long term care in the ED. CSW informed pt's wife he will be set up with a bedside [employer], HH-PT, SW and a referral to a [facility] to assist family with finding long term placement at home.   ED Mini Assessment:    Barriers to Discharge: Continued Medical Work up             Patient Contact and Communications        ,                 Admission diagnosis:  Failure to Thrive  Patient Active Problem List   Diagnosis Date Noted   Encephalopathy 06/29/2020   Convulsion (HCC) 06/12/2020   Intractable nausea and vomiting 06/12/2020   Acute encephalopathy 06/11/2020   Nausea & vomiting 06/11/2020   Complicated UTI (urinary tract infection) 01/05/2020   Dementia (HCC) 01/05/2020   UTI (urinary tract infection) 06/17/2017   Traumatic closed displaced fracture of shoulder with anterior dislocation with routine healing 06/10/2016   Dementia with behavioral disturbance (HCC) 04/16/2016   PCP:  Renaye Rakers, MD Pharmacy:   [facility] [phone] Ginette Otto, [place] - [address] AT [facility] [address] [place] [address] Phone: [phone] Fax: [phone]

## 2021-03-16 NOTE — ED Notes (Signed)
Patient continues to wait for PTAR 

## 2021-03-17 ENCOUNTER — Encounter (HOSPITAL_COMMUNITY): Payer: Self-pay | Admitting: Family Medicine

## 2021-03-17 ENCOUNTER — Emergency Department (HOSPITAL_COMMUNITY): Payer: Medicare (Managed Care)

## 2021-03-17 DIAGNOSIS — F0281 Dementia in other diseases classified elsewhere with behavioral disturbance: Secondary | ICD-10-CM

## 2021-03-17 DIAGNOSIS — A419 Sepsis, unspecified organism: Secondary | ICD-10-CM | POA: Diagnosis present

## 2021-03-17 DIAGNOSIS — Z7401 Bed confinement status: Secondary | ICD-10-CM | POA: Diagnosis not present

## 2021-03-17 DIAGNOSIS — E871 Hypo-osmolality and hyponatremia: Secondary | ICD-10-CM | POA: Diagnosis not present

## 2021-03-17 DIAGNOSIS — Z20822 Contact with and (suspected) exposure to covid-19: Secondary | ICD-10-CM | POA: Diagnosis present

## 2021-03-17 DIAGNOSIS — Z79899 Other long term (current) drug therapy: Secondary | ICD-10-CM | POA: Diagnosis not present

## 2021-03-17 DIAGNOSIS — R6520 Severe sepsis without septic shock: Secondary | ICD-10-CM

## 2021-03-17 DIAGNOSIS — Z83511 Family history of glaucoma: Secondary | ICD-10-CM | POA: Diagnosis not present

## 2021-03-17 DIAGNOSIS — D6959 Other secondary thrombocytopenia: Secondary | ICD-10-CM | POA: Diagnosis present

## 2021-03-17 DIAGNOSIS — D649 Anemia, unspecified: Secondary | ICD-10-CM

## 2021-03-17 DIAGNOSIS — R627 Adult failure to thrive: Secondary | ICD-10-CM | POA: Diagnosis present

## 2021-03-17 DIAGNOSIS — Z515 Encounter for palliative care: Secondary | ICD-10-CM | POA: Diagnosis not present

## 2021-03-17 DIAGNOSIS — G9340 Encephalopathy, unspecified: Secondary | ICD-10-CM

## 2021-03-17 DIAGNOSIS — D631 Anemia in chronic kidney disease: Secondary | ICD-10-CM | POA: Diagnosis present

## 2021-03-17 DIAGNOSIS — G309 Alzheimer's disease, unspecified: Secondary | ICD-10-CM | POA: Diagnosis not present

## 2021-03-17 DIAGNOSIS — G9341 Metabolic encephalopathy: Secondary | ICD-10-CM | POA: Diagnosis present

## 2021-03-17 DIAGNOSIS — Z7189 Other specified counseling: Secondary | ICD-10-CM | POA: Diagnosis not present

## 2021-03-17 DIAGNOSIS — Z8614 Personal history of Methicillin resistant Staphylococcus aureus infection: Secondary | ICD-10-CM | POA: Diagnosis not present

## 2021-03-17 DIAGNOSIS — Z681 Body mass index (BMI) 19 or less, adult: Secondary | ICD-10-CM | POA: Diagnosis not present

## 2021-03-17 DIAGNOSIS — N289 Disorder of kidney and ureter, unspecified: Secondary | ICD-10-CM | POA: Diagnosis not present

## 2021-03-17 DIAGNOSIS — G301 Alzheimer's disease with late onset: Secondary | ICD-10-CM | POA: Diagnosis not present

## 2021-03-17 DIAGNOSIS — R531 Weakness: Secondary | ICD-10-CM | POA: Diagnosis not present

## 2021-03-17 DIAGNOSIS — N1832 Chronic kidney disease, stage 3b: Secondary | ICD-10-CM | POA: Diagnosis present

## 2021-03-17 DIAGNOSIS — Z66 Do not resuscitate: Secondary | ICD-10-CM | POA: Diagnosis present

## 2021-03-17 DIAGNOSIS — J189 Pneumonia, unspecified organism: Secondary | ICD-10-CM | POA: Diagnosis not present

## 2021-03-17 DIAGNOSIS — F0391 Unspecified dementia with behavioral disturbance: Secondary | ICD-10-CM | POA: Diagnosis present

## 2021-03-17 DIAGNOSIS — J690 Pneumonitis due to inhalation of food and vomit: Secondary | ICD-10-CM | POA: Diagnosis present

## 2021-03-17 LAB — URINALYSIS, ROUTINE W REFLEX MICROSCOPIC
Bilirubin Urine: NEGATIVE
Glucose, UA: NEGATIVE mg/dL
Ketones, ur: NEGATIVE mg/dL
Nitrite: NEGATIVE
Protein, ur: NEGATIVE mg/dL
Specific Gravity, Urine: 1.009 (ref 1.005–1.030)
pH: 6 (ref 5.0–8.0)

## 2021-03-17 LAB — CBC WITH DIFFERENTIAL/PLATELET
Abs Immature Granulocytes: 0.04 10*3/uL (ref 0.00–0.07)
Basophils Absolute: 0 10*3/uL (ref 0.0–0.1)
Basophils Relative: 0 %
Eosinophils Absolute: 0 10*3/uL (ref 0.0–0.5)
Eosinophils Relative: 0 %
HCT: 45.7 % (ref 39.0–52.0)
Hemoglobin: 15.4 g/dL (ref 13.0–17.0)
Immature Granulocytes: 0 %
Lymphocytes Relative: 7 %
Lymphs Abs: 0.7 10*3/uL (ref 0.7–4.0)
MCH: 32 pg (ref 26.0–34.0)
MCHC: 33.7 g/dL (ref 30.0–36.0)
MCV: 94.8 fL (ref 80.0–100.0)
Monocytes Absolute: 0.4 10*3/uL (ref 0.1–1.0)
Monocytes Relative: 4 %
Neutro Abs: 8.8 10*3/uL — ABNORMAL HIGH (ref 1.7–7.7)
Neutrophils Relative %: 89 %
Platelets: 184 10*3/uL (ref 150–400)
RBC: 4.82 MIL/uL (ref 4.22–5.81)
RDW: 12.4 % (ref 11.5–15.5)
WBC: 10 10*3/uL (ref 4.0–10.5)
nRBC: 0 % (ref 0.0–0.2)

## 2021-03-17 LAB — COMPREHENSIVE METABOLIC PANEL
ALT: 18 U/L (ref 0–44)
AST: 36 U/L (ref 15–41)
Albumin: 4.2 g/dL (ref 3.5–5.0)
Alkaline Phosphatase: 89 U/L (ref 38–126)
Anion gap: 14 (ref 5–15)
BUN: 20 mg/dL (ref 8–23)
CO2: 22 mmol/L (ref 22–32)
Calcium: 9.3 mg/dL (ref 8.9–10.3)
Chloride: 100 mmol/L (ref 98–111)
Creatinine, Ser: 1.76 mg/dL — ABNORMAL HIGH (ref 0.61–1.24)
GFR, Estimated: 37 mL/min — ABNORMAL LOW (ref >60)
Glucose, Bld: 141 mg/dL — ABNORMAL HIGH (ref 70–99)
Potassium: 4.3 mmol/L (ref 3.5–5.1)
Sodium: 136 mmol/L (ref 135–145)
Total Bilirubin: 1.5 mg/dL — ABNORMAL HIGH (ref 0.3–1.2)
Total Protein: 7.6 g/dL (ref 6.5–8.1)

## 2021-03-17 LAB — APTT: aPTT: 33 seconds (ref 24–36)

## 2021-03-17 LAB — PROCALCITONIN
Procalcitonin: 0.53 ng/mL
Procalcitonin: 4.48 ng/mL

## 2021-03-17 LAB — PROTIME-INR
INR: 1.1 (ref 0.8–1.2)
Prothrombin Time: 14.3 seconds (ref 11.4–15.2)

## 2021-03-17 LAB — SARS CORONAVIRUS 2 (TAT 6-24 HRS): SARS Coronavirus 2: NEGATIVE

## 2021-03-17 LAB — LACTIC ACID, PLASMA
Lactic Acid, Venous: 4.7 mmol/L (ref 0.5–1.9)
Lactic Acid, Venous: 2.3 mmol/L (ref 0.5–1.9)

## 2021-03-17 MED ORDER — LACTATED RINGERS IV BOLUS (SEPSIS)
500.0000 mL | Freq: Once | INTRAVENOUS | Status: AC
  Administered 2021-03-17: 500 mL via INTRAVENOUS

## 2021-03-17 MED ORDER — PNEUMOCOCCAL VAC POLYVALENT 25 MCG/0.5ML IJ INJ
0.5000 mL | INJECTION | INTRAMUSCULAR | Status: DC
Start: 2021-03-18 — End: 2021-03-23
  Filled 2021-03-17: qty 0.5

## 2021-03-17 MED ORDER — LACTATED RINGERS IV BOLUS (SEPSIS)
1000.0000 mL | Freq: Once | INTRAVENOUS | Status: AC
  Administered 2021-03-17: 1000 mL via INTRAVENOUS

## 2021-03-17 MED ORDER — ACETAMINOPHEN 325 MG PO TABS: 650.0000 mg | ORAL_TABLET | Freq: Four times a day (QID) | ORAL | Status: DC | PRN

## 2021-03-17 MED ORDER — ACETAMINOPHEN 650 MG RE SUPP
650.0000 mg | Freq: Four times a day (QID) | RECTAL | Status: DC | PRN
  Administered 2021-03-17: 650 mg via RECTAL
  Filled 2021-03-17: qty 1

## 2021-03-17 MED ORDER — VANCOMYCIN HCL IN DEXTROSE 1-5 GM/200ML-% IV SOLN
1000.0000 mg | Freq: Once | INTRAVENOUS | Status: AC
  Administered 2021-03-17: 1000 mg via INTRAVENOUS
  Filled 2021-03-17: qty 200

## 2021-03-17 MED ORDER — LACTATED RINGERS IV BOLUS
1000.0000 mL | Freq: Once | INTRAVENOUS | Status: AC
  Administered 2021-03-17: 1000 mL via INTRAVENOUS

## 2021-03-17 MED ORDER — SODIUM CHLORIDE 0.9 % IV SOLN
2.0000 g | Freq: Once | INTRAVENOUS | Status: AC
  Administered 2021-03-17: 2 g via INTRAVENOUS
  Filled 2021-03-17: qty 20

## 2021-03-17 MED ORDER — VANCOMYCIN HCL 1250 MG/250ML IV SOLN: 1250.0000 mg | INTRAVENOUS | Status: DC

## 2021-03-17 MED ORDER — METRONIDAZOLE 500 MG/100ML IV SOLN
500.0000 mg | Freq: Three times a day (TID) | INTRAVENOUS | Status: DC
  Administered 2021-03-17 – 2021-03-19 (×7): 500 mg via INTRAVENOUS
  Filled 2021-03-17 (×7): qty 100

## 2021-03-17 MED ORDER — SENNOSIDES-DOCUSATE SODIUM 8.6-50 MG PO TABS: 1.0000 | ORAL_TABLET | Freq: Every evening | ORAL | Status: DC | PRN

## 2021-03-17 MED ORDER — SODIUM CHLORIDE 0.9 % IV SOLN
2.0000 g | Freq: Once | INTRAVENOUS | Status: AC
  Administered 2021-03-17: 2 g via INTRAVENOUS
  Filled 2021-03-17: qty 2

## 2021-03-17 MED ORDER — ACETAMINOPHEN 650 MG RE SUPP
650.0000 mg | Freq: Once | RECTAL | Status: AC
  Administered 2021-03-17: 650 mg via RECTAL
  Filled 2021-03-17: qty 1

## 2021-03-17 MED ORDER — SODIUM CHLORIDE 0.9 % IV SOLN
500.0000 mg | Freq: Once | INTRAVENOUS | Status: AC
  Administered 2021-03-17: 500 mg via INTRAVENOUS
  Filled 2021-03-17: qty 500

## 2021-03-17 MED ORDER — LACTATED RINGERS IV SOLN: INTRAVENOUS | Status: AC

## 2021-03-17 MED ORDER — HEPARIN SODIUM (PORCINE) 5000 UNIT/ML IJ SOLN
5000.0000 [IU] | Freq: Three times a day (TID) | INTRAMUSCULAR | Status: DC
  Administered 2021-03-17 – 2021-03-19 (×7): 5000 [IU] via SUBCUTANEOUS
  Filled 2021-03-17 (×9): qty 1

## 2021-03-17 MED ORDER — ONDANSETRON HCL 4 MG/2ML IJ SOLN: 4.0000 mg | Freq: Four times a day (QID) | INTRAMUSCULAR | Status: DC | PRN

## 2021-03-17 MED ORDER — ONDANSETRON HCL 4 MG PO TABS: 4.0000 mg | ORAL_TABLET | Freq: Four times a day (QID) | ORAL | Status: DC | PRN

## 2021-03-17 MED ORDER — LACTATED RINGERS IV SOLN: INTRAVENOUS | Status: DC

## 2021-03-17 MED ORDER — SODIUM CHLORIDE 0.9 % IV SOLN
2.0000 g | Freq: Two times a day (BID) | INTRAVENOUS | Status: DC
  Administered 2021-03-17 – 2021-03-19 (×4): 2 g via INTRAVENOUS
  Filled 2021-03-17 (×4): qty 2

## 2021-03-17 NOTE — Sepsis Progress Note (Signed)
Following for sepsis monitoring ?

## 2021-03-17 NOTE — Progress Notes (Signed)
Pharmacy Antibiotic Note  Jacob Rich is a 87 y.o. male admitted on 03/16/2021 with history of dementia and was brought to ED because of inability of his wife to take care of him at home. Pharmacy has been consulted to dose cefepime and vancomycin for sepsis  Plan: Vancomycin 1gm IV x 1 then 1250mg  q36h (AUC 515.4, Scr 1.76, TBW) Cefepime 2gm q12h Follow renal function, cultures and clinical course  Height: 6\' 4"  (193 cm) Weight: 75 kg (165 lb 5.5 oz) IBW/kg (Calculated) : 86.8  Temp (24hrs), Avg:99.9 F (37.7 C), Min:97.9 F (36.6 C), Max:103.8 F (39.9 C)  Recent Labs  Lab 03/15/21 2230 03/17/21 0137 03/17/21 0145  WBC 5.6  --  10.0  CREATININE 1.48*  --  1.76*  LATICACIDVEN  --  4.7*  --     Estimated Creatinine Clearance: 30.8 mL/min (A) (by C-G formula based on SCr of 1.76 mg/dL (H)).    No Known Allergies  Antimicrobials this admission: 7/30 CTX x1 7/30 azith x 1 7/30 cefepime >> 7/30 vanc >> 7/30 flagyl >>  Dose adjustments this admission:   Microbiology results: 7/30 BCx:  7/30 UCx:   Thank you for allowing pharmacy to be a part of this patient's care.  8/30 RPh 03/17/2021, 4:30 AM

## 2021-03-17 NOTE — H&P (Signed)
History and Physical    Jacob Rich HFW:[id number] DOB: [birth year] DOA: 03/16/2021  PCP: Renaye Rakers, MD   Patient coming from: Home   Chief Complaint: Lethargy  HPI: Jacob Rich is a 87 y.o. male with medical history significant for dementia and chronic renal insufficiency, now presenting to the emergency department with lethargy.  Patient is confused at baseline but had been able to ambulate independently and perform many of his ADLs and until the last 2 to 3 months, over which interval he has had significant worsening in cognition and independence.  He then worsened acutely over the past 2 to 3 days marked by increased confusion and fatigue.  He was seen in the emergency department on 03/15/2021 and case management was engaged and arranging home health services.  Patient returned home with his wife but came back to the ED yesterday due to lethargy.  Patient was not voicing any specific complaints.  He was noted to have an episode of nausea with nonbloody vomiting in the emergency department.  ED Course: Upon arrival to the ED, patient is found to be febrile to 39.9 C, saturating high 90s on room air, slightly tachycardic, and with blood pressure 135/70.  EKG features sinus tachycardia.  Chest x-ray with retrocardiac airspace opacity.  Chemistry panel notable for creatinine 1.76.  CBC unremarkable.  Lactic acid 4.7.  Blood culture was collected and the patient was given 3.5 L of LR, Rocephin, and azithromycin in the emergency department.  Review of Systems:  Unable to complete ROS secondary to the patient's clinical condition.  Past Medical History:  Diagnosis Date   Dementia (HCC)    MRSA infection     Past Surgical History:  Procedure Laterality Date   MRSa     2006/2007   OTHER SURGICAL HISTORY     Dicverticulitis    Social History:   reports that he has never smoked. He has never used smokeless tobacco. He reports previous alcohol use. He reports that he does not use  drugs.  No Known Allergies  Family History  Problem Relation Age of Onset   Glaucoma Mother      Prior to Admission medications   Medication Sig Start Date End Date Taking? Authorizing Provider  acetaminophen (TYLENOL) 325 MG tablet Take 2 tablets (650 mg total) by mouth every 6 (six) hours as needed for mild pain (or Fever >/= 101). 01/06/20   Lonia Blood, MD  Cyanocobalamin (VITAMIN B-12 PO) Take 1 tablet by mouth daily with lunch.    [provider]  donepezil (ARICEPT) 10 MG tablet Take 1 tablet (10 mg total) by mouth at bedtime. Patient taking differently: Take 10 mg by mouth daily.  04/16/16   Anson Fret, MD  folic acid (FOLVITE) 1 MG tablet Take 1 mg by mouth at bedtime.  08/18/19   [provider]  pantoprazole (PROTONIX) 40 MG tablet Take 1 tablet (40 mg total) by mouth daily. 06/14/20 07/14/20  Coralie Keens, MD    Physical Exam: Vitals:   03/17/21 0230 03/17/21 0300 03/17/21 0330 03/17/21 0400  BP: (!) 123/59 125/70 (!) 145/130 (!) 109/54  Pulse: 96 (!) 106 (!) 102 94  Resp: (!) 21 19 (!) 24 (!) 26  Temp:      TempSrc:      SpO2: 93% 99% 100% 99%  Weight:      Height:        Constitutional:Obtunded, no diaphoresis or pallor   Eyes: PERTLA, lids and conjunctivae normal ENMT:  Mucous membranes are moist. Posterior pharynx clear of any exudate or lesions.   Neck: normal, supple, no masses, no thyromegaly Respiratory: no wheezing, no crackles. No accessory muscle use.  Cardiovascular: S1 & S2 heard, regular rate and rhythm. No extremity edema.   Abdomen: No distension, no tenderness, soft. Bowel sounds active.  Musculoskeletal: no clubbing / cyanosis. No joint deformity upper and lower extremities.   Skin: no significant rashes, lesions, ulcers. Poor turgor. Neurologic: No gross facial asymmetry. Sensation intact. Obtunded.     Labs and Imaging on Admission: I have personally reviewed following labs and imaging  studies  CBC: Recent Labs  Lab 03/15/21 2230 03/17/21 0145  WBC 5.6 10.0  NEUTROABS 4.6 8.8*  HGB 12.9* 15.4  HCT 38.6* 45.7  MCV 95.8 94.8  PLT 205 184   Basic Metabolic Panel: Recent Labs  Lab 03/15/21 2230 03/17/21 0145  NA 136 136  K 3.9 4.3  CL 104 100  CO2 24 22  GLUCOSE 111* 141*  BUN 18 20  CREATININE 1.48* 1.76*  CALCIUM 9.0 9.3   GFR: Estimated Creatinine Clearance: 30.8 mL/min (A) (by C-G formula based on SCr of 1.76 mg/dL (H)). Liver Function Tests: Recent Labs  Lab 03/15/21 2230 03/17/21 0145  AST 21 36  ALT 12 18  ALKPHOS 81 89  BILITOT 1.2 1.5*  PROT 6.6 7.6  ALBUMIN 3.9 4.2   No results for input(s): LIPASE, AMYLASE in the last 168 hours. No results for input(s): AMMONIA in the last 168 hours. Coagulation Profile: Recent Labs  Lab 03/17/21 0145  INR 1.1   Cardiac Enzymes: No results for input(s): CKTOTAL, CKMB, CKMBINDEX, TROPONINI in the last 168 hours. BNP (last 3 results) No results for input(s): PROBNP in the last 8760 hours. HbA1C: No results for input(s): HGBA1C in the last 72 hours. CBG: No results for input(s): GLUCAP in the last 168 hours. Lipid Profile: No results for input(s): CHOL, HDL, LDLCALC, TRIG, CHOLHDL, LDLDIRECT in the last 72 hours. Thyroid Function Tests: No results for input(s): TSH, T4TOTAL, FREET4, T3FREE, THYROIDAB in the last 72 hours. Anemia Panel: No results for input(s): VITAMINB12, FOLATE, FERRITIN, TIBC, IRON, RETICCTPCT in the last 72 hours. Urine analysis:    Component Value Date/Time   COLORURINE YELLOW 03/16/2021 0130   APPEARANCEUR CLEAR 03/16/2021 0130   LABSPEC 1.020 03/16/2021 0130   PHURINE 6.0 03/16/2021 0130   GLUCOSEU NEGATIVE 03/16/2021 0130   HGBUR SMALL (A) 03/16/2021 0130   BILIRUBINUR NEGATIVE 03/16/2021 0130   KETONESUR NEGATIVE 03/16/2021 0130   PROTEINUR NEGATIVE 03/16/2021 0130   NITRITE NEGATIVE 03/16/2021 0130   LEUKOCYTESUR TRACE (A) 03/16/2021 0130   Sepsis  Labs: @LABRCNTIP (procalcitonin:4,lacticidven:4) ) Recent Results (from the past 240 hour(s))  Resp Panel by RT-PCR (Flu A&B, Covid) Nasopharyngeal Swab     Status: None   Collection Time: 03/15/21 10:03 PM   Specimen: Nasopharyngeal Swab; Nasopharyngeal(NP) swabs in vial transport medium  Result Value Ref Range Status   SARS Coronavirus 2 by RT PCR NEGATIVE NEGATIVE Final    Comment: (NOTE) SARS-CoV-2 target nucleic acids are NOT DETECTED.  The SARS-CoV-2 RNA is generally detectable in upper respiratory specimens during the acute phase of infection. The lowest concentration of SARS-CoV-2 viral copies this assay can detect is 138 copies/mL. A negative result does not preclude SARS-Cov-2 infection and should not be used as the sole basis for treatment or other patient management decisions. A negative result may occur with  improper specimen collection/handling, submission of specimen other than nasopharyngeal swab,  presence of viral mutation(s) within the areas targeted by this assay, and inadequate number of viral copies(<138 copies/mL). A negative result must be combined with clinical observations, patient history, and epidemiological information. The expected result is Negative.  Fact Sheet for Patients:  BloggerCourse.com  Fact Sheet for Healthcare Providers:  SeriousBroker.it  This test is no t yet approved or cleared by the [place] FDA and  has been authorized for detection and/or diagnosis of SARS-CoV-2 by FDA under an Emergency Use Authorization (EUA). This EUA will remain  in effect (meaning this test can be used) for the duration of the COVID-19 declaration under Section 564(b)(1) of the Act, 21 U.S.C.section 360bbb-3(b)(1), unless the authorization is terminated  or revoked sooner.       Influenza A by PCR NEGATIVE NEGATIVE Final   Influenza B by PCR NEGATIVE NEGATIVE Final    Comment: (NOTE) The Xpert Xpress  SARS-CoV-2/FLU/RSV plus assay is intended as an aid in the diagnosis of influenza from Nasopharyngeal swab specimens and should not be used as a sole basis for treatment. Nasal washings and aspirates are unacceptable for Xpert Xpress SARS-CoV-2/FLU/RSV testing.  Fact Sheet for Patients: BloggerCourse.com  Fact Sheet for Healthcare Providers: SeriousBroker.it  This test is not yet approved or cleared by the [place] FDA and has been authorized for detection and/or diagnosis of SARS-CoV-2 by FDA under an Emergency Use Authorization (EUA). This EUA will remain in effect (meaning this test can be used) for the duration of the COVID-19 declaration under Section 564(b)(1) of the Act, 21 U.S.C. section 360bbb-3(b)(1), unless the authorization is terminated or revoked.  Performed at [facility], [address], [place], [place] [address]      Radiological Exams on Admission: CT Head Wo Contrast  Result Date: 03/15/2021 CLINICAL DATA:  Altered mental status. EXAM: CT HEAD WITHOUT CONTRAST TECHNIQUE: Contiguous axial images were obtained from the base of the skull through the vertex without intravenous contrast. COMPARISON:  June 11, 2020 FINDINGS: Brain: There is mild cerebral atrophy with widening of the extra-axial spaces and ventricular dilatation. There are areas of decreased attenuation within the white matter tracts of the supratentorial brain, consistent with microvascular disease changes. Vascular: No hyperdense vessel or unexpected calcification. Skull: Normal. Negative for fracture or focal lesion. Sinuses/Orbits: No acute finding. Other: None. IMPRESSION: 1. Generalized cerebral atrophy. 2. No acute intracranial abnormality. Electronically Signed   By: Aram Candela M.D.   On: 03/15/2021 22:17   DG Chest Port 1 View  Result Date: 03/17/2021 CLINICAL DATA:  Patient here from home, brought back after  discharge with complaints of fatigue. Wife unable to care for patient. patient poor historian. Hx dementia. EXAM: PORTABLE CHEST 1 VIEW COMPARISON:  Chest x-ray 03/15/2021, CT chest 06/27/2020 FINDINGS: The heart size and mediastinal contours are unchanged. Aortic calcification. Coarsened interstitial markings at the right base persistent. Retrocardiac airspace opacity with silhouetting off left hemidiaphragm. No pulmonary edema. No pleural effusion. No pneumothorax. No acute osseous abnormality. Degenerative changes of the shoulders. Degenerative changes of the spine. IMPRESSION: Retrocardiac airspace opacity that may represent atelectasis versus infection/inflammation. Electronically Signed   By: Tish Frederickson M.D.   On: 03/17/2021 02:03   DG Chest Port 1 View  Result Date: 03/15/2021 CLINICAL DATA:  87 year old male with altered mental status EXAM: PORTABLE CHEST 1 VIEW COMPARISON:  Chest radiograph dated 06/26/2020 and CT dated 06/27/2020 FINDINGS: Bibasilar hazy densities, likely atelectasis. Atypical infiltrate is less likely but not excluded. Clinical correlation is recommended. There is  mild chronic interstitial coarsening. No lobar consolidation, pleural effusion or pneumothorax. Top-normal cardiac size. No acute osseous pathology. IMPRESSION: Bibasilar hazy densities, likely atelectasis. Electronically Signed   By: Elgie Collard M.D.   On: 03/15/2021 22:22    EKG: Independently reviewed. Sinus tachycardia, rate 101.   Assessment/Plan   1. SIRS, rule-out sepsis  - Presents with 2 days of increased confusion and lethargy, acutely worse yesterday, found to have temp 39.9 C, slightly tachycardic, and initial lactate 4.7  - UA yesterday not suggestive of infection; question of possible PNA on CXR though sat is close to 100% on rm air and respirations unlabored; abd exam benign; no meningismus; no apparent cellulitis  - Blood culture collected in ED, 30 cc/kg LR was given, and empiric Rocephin  and azithromycin started   - Trend lactate, check COVID pcr, check/trend procalcitonin, broaden antibiotics for now, follow cultures and clinical course    2. Acute encephalopathy  - Pt's wife notes decline in mental status over the past 2-3 months but worsened acutely over the past 2-3 days  - No acute findings on head CT 03/15/21, suspect acute change related to fever/dehydration/possible sepsis  - Continue IVF hydration, sepsis workup and empiric antibiotics, expand workup if fails to improve with fluids and treatment of possible infection   3. Dementia  - Significant decline over the past 2-3 months, requiring increasing amounts of assistance with ADLs per pt's wife  - Supportive care, delirium precautions  4. CKD IIIb  - SCr is 1.76 on admission, up from 1.48 the day prior - He is being fluid-resuscitated in ED  - Renally-dose medications, repeat chem panel    DVT prophylaxis: sq heparin  Code Status: DNR  Level of Care: Level of care: Telemetry Family Communication: wife updated from ED  Disposition Plan:  Patient is from: Home  Anticipated d/c is to: TBD Anticipated d/c date is: 03/20/21  Patient currently: Pending cultures, improvement in sepsis parameters, safe discharge plan   Consults called: None  Admission status: Inpatient     Briscoe Deutscher, MD [facility]  03/17/2021, 4:18 AM

## 2021-03-17 NOTE — ED Provider Notes (Signed)
[facility]-EMERGENCY DEPT Provider Note   CSN: [id number] Arrival date & time: 03/16/21  2259     History Chief Complaint  Patient presents with   Failure To Thrive    Jacob Rich is a 87 y.o. male.  The history is provided by the EMS personnel and medical records. The history is limited by the condition of the patient (Patient nonverbal).  He has history of dementia and was brought in by ambulance because of inability of his wife to take care of him at home.  He had been discharged from the emergency department earlier today and he had been seen by transitions of care team with arrangements being made for home health services and intent to find placement at a skilled nursing facility.  Wife apparently was unable to manage his care at home and called for EMS.   Past Medical History:  Diagnosis Date   Dementia (HCC)    MRSA infection     Patient Active Problem List   Diagnosis Date Noted   Encephalopathy 06/29/2020   Convulsion (HCC) 06/12/2020   Intractable nausea and vomiting 06/12/2020   Acute encephalopathy 06/11/2020   Nausea & vomiting 06/11/2020   Complicated UTI (urinary tract infection) 01/05/2020   Dementia (HCC) 01/05/2020   UTI (urinary tract infection) 06/17/2017   Traumatic closed displaced fracture of shoulder with anterior dislocation with routine healing 06/10/2016   Dementia with behavioral disturbance (HCC) 04/16/2016    Past Surgical History:  Procedure Laterality Date   MRSa     2006/2007   OTHER SURGICAL HISTORY     Dicverticulitis       Family History  Problem Relation Age of Onset   Glaucoma Mother     Social History   Tobacco Use   Smoking status: Never   Smokeless tobacco: Never  Substance Use Topics   Alcohol use: Not Currently    Comment: rare   Drug use: No    Home Medications Prior to Admission medications   Medication Sig Start Date End Date Taking? Authorizing Provider  acetaminophen (TYLENOL) 325  MG tablet Take 2 tablets (650 mg total) by mouth every 6 (six) hours as needed for mild pain (or Fever >/= 101). 01/06/20   Lonia Blood, MD  Cyanocobalamin (VITAMIN B-12 PO) Take 1 tablet by mouth daily with lunch.    [provider]  donepezil (ARICEPT) 10 MG tablet Take 1 tablet (10 mg total) by mouth at bedtime. Patient taking differently: Take 10 mg by mouth daily.  04/16/16   Anson Fret, MD  folic acid (FOLVITE) 1 MG tablet Take 1 mg by mouth at bedtime.  08/18/19   [provider]  pantoprazole (PROTONIX) 40 MG tablet Take 1 tablet (40 mg total) by mouth daily. 06/14/20 07/14/20  Arrien, York Ram, MD    Allergies    Patient has no known allergies.  Review of Systems   Review of Systems  Unable to perform ROS: Patient nonverbal   Physical Exam Updated Vital Signs BP 135/70 (BP Location: Right Arm)   Pulse (!) 101   Temp 98 F (36.7 C) (Oral)   Resp 18   SpO2 100%   Physical Exam Vitals reviewed.  87 year old male, resting comfortably and in no acute distress. Vital signs are significant for borderline elevated heart rate. Oxygen saturation is 100%, which is normal. Head is normocephalic and atraumatic. PERRLA, EOMI. Oropharynx is clear.  Mucous membranes are dry. Neck is nontender and supple without  adenopathy or JVD. Back is nontender and there is no CVA tenderness. Lungs are clear without rales, wheezes, or rhonchi. Chest is nontender. Heart has regular rate and rhythm without murmur. Abdomen is soft, flat, nontender without masses or hepatosplenomegaly and peristalsis is normoactive. Extremities have no cyanosis or edema. Skin is warm and dry without rash.  Skin turgor is decreased. Neurologic: Awake but nonverbal, does not follow commands but does respond purposefully to noxious stimuli, cranial nerves are intact, moves all extremities equally.  ED Results / Procedures / Treatments   Labs (all labs ordered are listed, but only  abnormal results are displayed) Labs Reviewed  COMPREHENSIVE METABOLIC PANEL  CBC WITH DIFFERENTIAL/PLATELET  URINALYSIS, ROUTINE W REFLEX MICROSCOPIC    EKG None  Radiology CT Head Wo Contrast  Result Date: 03/15/2021 CLINICAL DATA:  Altered mental status. EXAM: CT HEAD WITHOUT CONTRAST TECHNIQUE: Contiguous axial images were obtained from the base of the skull through the vertex without intravenous contrast. COMPARISON:  June 11, 2020 FINDINGS: Brain: There is mild cerebral atrophy with widening of the extra-axial spaces and ventricular dilatation. There are areas of decreased attenuation within the white matter tracts of the supratentorial brain, consistent with microvascular disease changes. Vascular: No hyperdense vessel or unexpected calcification. Skull: Normal. Negative for fracture or focal lesion. Sinuses/Orbits: No acute finding. Other: None. IMPRESSION: 1. Generalized cerebral atrophy. 2. No acute intracranial abnormality. Electronically Signed   By: Aram Candela M.D.   On: 03/15/2021 22:17   DG Chest Port 1 View  Result Date: 03/15/2021 CLINICAL DATA:  87 year old male with altered mental status EXAM: PORTABLE CHEST 1 VIEW COMPARISON:  Chest radiograph dated 06/26/2020 and CT dated 06/27/2020 FINDINGS: Bibasilar hazy densities, likely atelectasis. Atypical infiltrate is less likely but not excluded. Clinical correlation is recommended. There is mild chronic interstitial coarsening. No lobar consolidation, pleural effusion or pneumothorax. Top-normal cardiac size. No acute osseous pathology. IMPRESSION: Bibasilar hazy densities, likely atelectasis. Electronically Signed   By: Elgie Collard M.D.   On: 03/15/2021 22:22    Procedures Procedures  CRITICAL CARE Performed by: Dione Booze Total critical care time: 55 minutes Critical care time was exclusive of separately billable procedures and treating other patients. Critical care was necessary to treat or prevent imminent  or life-threatening deterioration. Critical care was time spent personally by me on the following activities: development of treatment plan with patient and/or surrogate as well as nursing, discussions with consultants, evaluation of patient's response to treatment, examination of patient, obtaining history from patient or surrogate, ordering and performing treatments and interventions, ordering and review of laboratory studies, ordering and review of radiographic studies, pulse oximetry and re-evaluation of patient's condition.  Medications Ordered in ED Medications  lactated ringers bolus 1,000 mL (has no administration in time range)    ED Course  I have reviewed the triage vital signs and the nursing notes.  Pertinent labs & imaging results that were available during my care of the patient were reviewed by me and considered in my medical decision making (see chart for details).   MDM Rules/Calculators/A&P                         Weakness which was evaluated in the ED yesterday and negative work-up.  Old records are reviewed confirming ED visit yesterday with extensive evaluation including normal urinalysis, labs showing mild renal insufficiency which was at baseline.  He had negative CT of head and chest x-ray, negative urine drug screen.  He does appear clinically dehydrated today, will give IV fluids and recheck labs and urinalysis.  No need to repeat chest x-ray or CT of head.  We will need to get transitions of care team involved.  2:30 AM Patient is spiked fever to 103.8.  Respirations are noted to be significantly increased as well.  Chest x-ray shows new retrocardiac infiltrate concerning for pneumonia, likely source of infection.  He started on evolving sepsis pathway and started on antibiotics for community-acquired pneumonia.  He will need to be admitted.  Creat has increased slightly over baseline.  Lactic acid level has come back elevated at over 4.  He is given early,  goal-directed fluids.  Case is discussed with Dr. Antionette Char of [facility], who agrees to admit the patient.  Final Clinical Impression(s) / ED Diagnoses Final diagnoses:  Community acquired pneumonia of left lower lobe of lung  Renal insufficiency    Rx / DC Orders ED Discharge Orders     None        Dione Booze, MD 03/17/21 [phone]

## 2021-03-17 NOTE — Progress Notes (Signed)
Care started prior to midnight in the emergency room and patient is admitted early this morning after midnight by Dr. Christia Reading Opyd and I am in current agreement with his assessment and plan.  Additional changes to the plan of care been made accordingly and updated.  Patient is an 87 year old Caucasian male with a past medical history significant for but not limited to dementia and chronic renal insufficiency with a CKD stage IIIb, history of MRSA infection as well as other comorbidities who presented to the ED with lethargy.  At baseline he is usually confused but has been able to ambulate independently and perform many of his ADLs until last 2 to 3 months over which she had significant worsening in his cognition and his independence.  He then acutely worsened over the past 2 to 3 days with marked increased confusion and fatigue.  He was sent to the ED on 03/15/2021 and case manager met was engaged and arranging home health services.  He returned home with his wife but came back to the ED yesterday due to lethargy though he is not voicing any specific complaints.  He was noted to have episode of nausea with nonbloody vomiting in the ED and he was found to be febrile in the ED with tachycardia.  Chest x-ray showed a retrocardiac airspace opacity.  Chemistry panel was notable for an elevated creatinine of 1.6 and his lactic acid level was 4.7.  Blood and urine cultures were obtained and he was given 3.5 L of lactated Ringer's and started on antibiotics with IV ceftriaxone and azithromycin.  Currently he is being evaluated and treated for the following but not limited to:  Severe sepsis likely secondary to pneumonia present on admission -Presents with 2 days of increased confusion and lethargy, acutely worse yesterday, found to have temp 39.9 C, slightly tachycardic, and initial lactate 4.7  -UA yesterday not suggestive of infection; question of possible PNA on CXR though sat is close to 100% on rm air and  respirations unlabored; abd exam benign; no meningismus; no apparent cellulitis  -PCT went from 0.53 -> 4.48 -LA went from 4.7 -> 2.3 -Blood cultures collected in ED, 30 cc/kg LR was given, and empiric Rocephin and azithromycin started   -Continue to Trend lactate, -Checking COVID PCR and is still pending -U/A showed Hazy Appearance with Small Hgb Trace Leukocytes, and Rare Bacteria -Check/Trend Procalcitonin -Broadened antibiotics for now and will remain on IVC Vanc, IV Cefepime, and IV Flagyl -Continue to Follow cultures and clinical course   -Patient will need PT OT to further evaluate and treat once he is improved   Acute Encephalopathy  -Pt's wife notes decline in mental status over the past 2-3 months but worsened acutely over the past 2-3 days  -No acute findings on head CT 03/15/21, suspect acute change related to fever/dehydration/possible sepsis  -Continue IVF hydration and reduce rate to 75 mL/hr, -C/w Sepsis workup and empiric antibiotics, expand workup if fails to improve with fluids and treatment of possible infection    Dementia  -Significant decline over the past 2-3 months, requiring increasing amounts of assistance with ADLs per pt's wife  -C/w Supportive care -Delirium precautions   CKD IIIb  -SCr is 1.76 on admission, up from 1.48 the day prior -He is being fluid-resuscitated in ED  -Avoid nephrotoxic medications if possible, contrast dyes, hypotension and renally adjust medications -Repeat CMP in the a.m.   Normocytic Anemia  -Patient's Hgb/Hct went from 12.9/38.6 -> 15.4/45.7 -Check anemia panel in a.m.  Continue to monitor for signs and symptoms of bleeding; no overt bleeding noted -Repeat CBC in a.m.  Hyperbilirubinemia -Mild -The Patient's T bili went from 1.2 is to 1.5 -Likely reactive and will need to continue monitor and trend and repeat CMP in the a.m.  We will continue to monitor the patient's clinical response to intervention and repeat blood work  and imaging in the a.m. by repeating a chest x-ray

## 2021-03-17 NOTE — ED Notes (Signed)
Attempted to in and out cath, no urine obtained.  Pt's skin and lips dry.  Applied primofit.

## 2021-03-18 ENCOUNTER — Inpatient Hospital Stay (HOSPITAL_COMMUNITY): Payer: Medicare (Managed Care)

## 2021-03-18 DIAGNOSIS — E871 Hypo-osmolality and hyponatremia: Secondary | ICD-10-CM

## 2021-03-18 DIAGNOSIS — A419 Sepsis, unspecified organism: Secondary | ICD-10-CM | POA: Diagnosis not present

## 2021-03-18 DIAGNOSIS — G9340 Encephalopathy, unspecified: Secondary | ICD-10-CM | POA: Diagnosis not present

## 2021-03-18 DIAGNOSIS — G309 Alzheimer's disease, unspecified: Secondary | ICD-10-CM | POA: Diagnosis not present

## 2021-03-18 DIAGNOSIS — R7401 Elevation of levels of liver transaminase levels: Secondary | ICD-10-CM

## 2021-03-18 DIAGNOSIS — N1832 Chronic kidney disease, stage 3b: Secondary | ICD-10-CM | POA: Diagnosis not present

## 2021-03-18 DIAGNOSIS — E872 Acidosis: Secondary | ICD-10-CM

## 2021-03-18 LAB — URINE CULTURE

## 2021-03-18 LAB — CBC WITH DIFFERENTIAL/PLATELET
Abs Immature Granulocytes: 0.01 10*3/uL (ref 0.00–0.07)
Basophils Absolute: 0 10*3/uL (ref 0.0–0.1)
Basophils Relative: 0 %
Eosinophils Absolute: 0 10*3/uL (ref 0.0–0.5)
Eosinophils Relative: 1 %
HCT: 30.9 % — ABNORMAL LOW (ref 39.0–52.0)
Hemoglobin: 10.6 g/dL — ABNORMAL LOW (ref 13.0–17.0)
Immature Granulocytes: 0 %
Lymphocytes Relative: 13 %
Lymphs Abs: 0.5 10*3/uL — ABNORMAL LOW (ref 0.7–4.0)
MCH: 32.4 pg (ref 26.0–34.0)
MCHC: 34.3 g/dL (ref 30.0–36.0)
MCV: 94.5 fL (ref 80.0–100.0)
Monocytes Absolute: 0.3 10*3/uL (ref 0.1–1.0)
Monocytes Relative: 8 %
Neutro Abs: 2.9 10*3/uL (ref 1.7–7.7)
Neutrophils Relative %: 78 %
Platelets: 106 10*3/uL — ABNORMAL LOW (ref 150–400)
RBC: 3.27 MIL/uL — ABNORMAL LOW (ref 4.22–5.81)
RDW: 12.6 % (ref 11.5–15.5)
WBC: 3.7 10*3/uL — ABNORMAL LOW (ref 4.0–10.5)
nRBC: 0 % (ref 0.0–0.2)

## 2021-03-18 LAB — IRON AND TIBC
Iron: 11 ug/dL — ABNORMAL LOW (ref 45–182)
Saturation Ratios: 7 % — ABNORMAL LOW (ref 17.9–39.5)
TIBC: 167 ug/dL — ABNORMAL LOW (ref 250–450)
UIBC: 156 ug/dL

## 2021-03-18 LAB — RETICULOCYTES
Immature Retic Fract: 8.1 % (ref 2.3–15.9)
RBC.: 3.26 MIL/uL — ABNORMAL LOW (ref 4.22–5.81)
Retic Count, Absolute: 38.5 10*3/uL (ref 19.0–186.0)
Retic Ct Pct: 1.2 % (ref 0.4–3.1)

## 2021-03-18 LAB — COMPREHENSIVE METABOLIC PANEL
ALT: 22 U/L (ref 0–44)
AST: 56 U/L — ABNORMAL HIGH (ref 15–41)
Albumin: 2.6 g/dL — ABNORMAL LOW (ref 3.5–5.0)
Alkaline Phosphatase: 50 U/L (ref 38–126)
Anion gap: 9 (ref 5–15)
BUN: 23 mg/dL (ref 8–23)
CO2: 19 mmol/L — ABNORMAL LOW (ref 22–32)
Calcium: 7.9 mg/dL — ABNORMAL LOW (ref 8.9–10.3)
Chloride: 106 mmol/L (ref 98–111)
Creatinine, Ser: 1.4 mg/dL — ABNORMAL HIGH (ref 0.61–1.24)
GFR, Estimated: 48 mL/min — ABNORMAL LOW (ref >60)
Glucose, Bld: 83 mg/dL (ref 70–99)
Potassium: 3.7 mmol/L (ref 3.5–5.1)
Sodium: 134 mmol/L — ABNORMAL LOW (ref 135–145)
Total Bilirubin: 1.1 mg/dL (ref 0.3–1.2)
Total Protein: 4.7 g/dL — ABNORMAL LOW (ref 6.5–8.1)

## 2021-03-18 LAB — MAGNESIUM: Magnesium: 1.9 mg/dL (ref 1.7–2.4)

## 2021-03-18 LAB — FOLATE: Folate: 8.4 ng/mL (ref >5.9)

## 2021-03-18 LAB — PROCALCITONIN: Procalcitonin: 10.6 ng/mL

## 2021-03-18 LAB — VITAMIN B12: Vitamin B-12: 270 pg/mL (ref 180–914)

## 2021-03-18 LAB — FERRITIN: Ferritin: 480 ng/mL — ABNORMAL HIGH (ref 24–336)

## 2021-03-18 LAB — PHOSPHORUS: Phosphorus: 2.2 mg/dL — ABNORMAL LOW (ref 2.5–4.6)

## 2021-03-18 MED ORDER — POLYSACCHARIDE IRON COMPLEX 150 MG PO CAPS
150.0000 mg | ORAL_CAPSULE | Freq: Every day | ORAL | Status: DC
  Administered 2021-03-21 – 2021-03-23 (×3): 150 mg via ORAL
  Filled 2021-03-18 (×6): qty 1

## 2021-03-18 MED ORDER — VANCOMYCIN HCL IN DEXTROSE 1-5 GM/200ML-% IV SOLN
1000.0000 mg | INTRAVENOUS | Status: DC
  Administered 2021-03-18: 1000 mg via INTRAVENOUS
  Filled 2021-03-18 (×2): qty 200

## 2021-03-18 MED ORDER — SODIUM CHLORIDE 0.9 % IV SOLN
INTRAVENOUS | Status: DC
Start: 2021-03-18 — End: 2021-03-21

## 2021-03-18 NOTE — Progress Notes (Signed)
PROGRESS NOTE    Jacob Rich  UEK:[id number] DOB: 1930/10/11 DOA: 03/16/2021 PCP: Lucianne Lei, MD   Brief Narrative:  Patient is an 87 year old Caucasian male with a past medical history significant for but not limited to dementia and chronic renal insufficiency with a CKD stage IIIb, history of MRSA infection as well as other comorbidities who presented to the ED with lethargy.  At baseline he is usually confused but has been able to ambulate independently and perform many of his ADLs until last 2 to 3 months over which she had significant worsening in his cognition and his independence.  He then acutely worsened over the past 2 to 3 days with marked increased confusion and fatigue.  He was sent to the ED on 03/15/2021 and case manager met was engaged and arranging home health services.  He returned home with his wife but came back to the ED yesterday due to lethargy though he is not voicing any specific complaints.  He was noted to have episode of nausea with nonbloody vomiting in the ED and he was found to be febrile in the ED with tachycardia.  Chest x-ray showed a retrocardiac airspace opacity.  Chemistry panel was notable for an elevated creatinine of 1.6 and his lactic acid level was 4.7.  Blood and urine cultures were obtained and he was given 3.5 L of lactated Ringer's and started on antibiotics with IV ceftriaxone and azithromycin. Antibiotics were escalated to IV cefepime, IV Flagyl, IV vancomycin and procalcitonin continues to worsen but renal function is improving.  Patient's mentation is also improving today.  Assessment & Plan:   Principal Problem:   Sepsis ([place]) Active Problems:   Dementia with behavioral disturbance (HCC)   Acute encephalopathy   Chronic kidney disease, stage 3b (HCC)  Severe sepsis likely secondary to pneumonia present on admission -Presents with 2 days of increased confusion and lethargy, acutely worse yesterday, found to have temp 39.9 C, slightly tachycardic,  and initial lactate 4.7 -Sepsis physiology is improving but not totally resolved as he is still spiking temperatures and he had a T-max of 101.4 -UA yesterday not suggestive of infection; question of possible PNA on CXR though sat is close to 100% on rm air and respirations unlabored; abd exam benign; no meningismus; no apparent cellulitis  -PCT went from 0.53 -> 4.48 is further worsened and is now 10.60 -LA went from 4.7 -> 2.3 -Blood cultures collected in ED, 30 cc/kg LR was given, and empiric Rocephin and azithromycin started   -Continue to Trend lactate, -Checking COVID PCR and was negative -U/A showed Hazy Appearance with Small Hgb Trace Leukocytes, and Rare Bacteria but showed 0-5 RBCs per high-power field and 0-5 WBCs -Check/Trend Procalcitonin -Broadened antibiotics for now and will remain on IVC Vanc, IV Cefepime, and IV Flagyl for now -Continue to Follow cultures and clinical course   -WBC is trending down and went from 8.0 is now 3.7 -Patient will need PT OT to further evaluate and treat once he is improved   Acute Encephalopathy, improving -Pt's wife notes decline in mental status over the past 2-3 months but worsened acutely over the past 2-3 days  -No acute findings on head CT 03/15/21, suspect acute change related to fever/dehydration/possible sepsis  -Continue IVF hydration and reduce rate to 75 mL/hr, -C/w Sepsis workup and empiric antibiotics, expand workup if fails to improve with fluids and treatment of possible infection  -Mentation is improving but will continue with delirium precautions   Dementia  -Significant decline  over the past 2-3 months, requiring increasing amounts of assistance with ADLs per pt's wife  -C/w Supportive care -Delirium precautions   CKD IIIb  -SCr is 1.76 on admission, up from 1.48 the day prior; now patient's BUNs/creatinine is 23/1.40 -He is being fluid-resuscitated in ED  -Further IV fluid resuscitation has now been stopped but will resume  maintenance IV fluid hydration with normal saline at 75 MLS per hour -Avoid nephrotoxic medications if possible, contrast dyes, hypotension and renally adjust medications -Repeat CMP in the a.m.   Normocytic Anemia -Patient's Hgb/Hct went from 12.9/38.6 -> 15.4/45.7 and is further dropped down to 10.6/30.9 in the setting of dilution -Checked anemia panel and showed an iron level of 11, U IBC 156, TIBC 167, saturation ratios of 7%, ferritin level 480, folate level 8.4, and vitamin B12 level of 270 -Will start p.o. Niferex 150 mg daily -Continue to monitor for signs and symptoms of bleeding; no overt bleeding noted -Repeat CBC in a.m.  Hyperbilirubinemia -Mild -The Patient's T bili went from 1.2 is to 1.5 yesterday and is now 1.1 today -Likely reactive and will need to continue monitor and trend and repeat CMP in the a.m.  Thrombocytopenia -Likely in the setting of infection and antibiotic usage -The patient's platelet count has gone from 184 is now 106 -Continue monitor for signs and symptoms bleeding; currently no overt bleeding noted -Repeat CBC in a.m.  Hyponatremia -Patient's sodium has dropped and went from 136 is now 134 -Start maintenance IV fluid at 75 MLS per hour with normal saline -Continue monitor and trend and repeat CMP in a.m.  Elevated AST -Mild -The patient's AST went from 36 is now 56 -continue monitor and trend and repeat hepatic function panel in the a.m. and if necessary will obtain a right upper quadrant ultrasound and a acute hepatitis panel -repeat CMP in a.m.  Metabolic Acidosis -Mild -Patient CO2 is now 19, anion gap is 9, chloride level is 165 and continue maintenance IV fluid hydration with normal saline at 75 MLS per hour left-continue monitor and trend and repeat CMP in a.m.  Hypophosphatemia -Patient's Phos level is 2.2 -Replete with p.o. K-Phos 500 mg p.o. twice daily x2 doses -Continue to monitor and replete as necessary -Repeat phosphorus  level in a.m.  DVT prophylaxis: Heparin injection 5000 units subcu every 8 hours Code Status: DO NOT RESUSCITATE Family Communication: No family present at bedside  Disposition Plan: Need further clinical improvement and evaluation by PT and OT  Status is: Inpatient  Remains inpatient appropriate because:Unsafe d/c plan, IV treatments appropriate due to intensity of illness or inability to take PO, and Inpatient level of care appropriate due to severity of illness  Dispo: The patient is from: Home              Anticipated d/c is to:  TBD              Patient currently is not medically stable to d/c.   Difficult to place patient No  Consultants:  None  Procedures: None  Antimicrobials:  Anti-infectives (From admission, onward)    Start     Dose/Rate Route Frequency Ordered Stop   03/18/21 2000  vancomycin (VANCOREADY) IVPB 1250 mg/250 mL  Status:  Discontinued        1,250 mg 166.7 mL/hr over 90 Minutes Intravenous Every 36 hours 03/17/21 0607 03/18/21 1125   03/18/21 1300  vancomycin (VANCOCIN) IVPB 1000 mg/200 mL premix        1,000 mg  200 mL/hr over 60 Minutes Intravenous Every 24 hours 03/18/21 1125     03/17/21 1800  ceFEPIme (MAXIPIME) 2 g in sodium chloride 0.9 % 100 mL IVPB        2 g 200 mL/hr over 30 Minutes Intravenous Every 12 hours 03/17/21 0550     03/17/21 0430  ceFEPIme (MAXIPIME) 2 g in sodium chloride 0.9 % 100 mL IVPB        2 g 200 mL/hr over 30 Minutes Intravenous  Once 03/17/21 0418 03/17/21 0553   03/17/21 0430  metroNIDAZOLE (FLAGYL) IVPB 500 mg        500 mg 100 mL/hr over 60 Minutes Intravenous Every 8 hours 03/17/21 0418     03/17/21 0430  vancomycin (VANCOCIN) IVPB 1000 mg/200 mL premix        1,000 mg 200 mL/hr over 60 Minutes Intravenous  Once 03/17/21 0418 03/17/21 0655   03/17/21 0230  cefTRIAXone (ROCEPHIN) 2 g in sodium chloride 0.9 % 100 mL IVPB        2 g 200 mL/hr over 30 Minutes Intravenous  Once 03/17/21 0225 03/17/21 0300   03/17/21  0230  azithromycin (ZITHROMAX) 500 mg in sodium chloride 0.9 % 250 mL IVPB        500 mg 250 mL/hr over 60 Minutes Intravenous  Once 03/17/21 0225 03/17/21 0400        Subjective: Seen and examined at bedside and his mentation has been much better is much more awake and alert.  He denies any specific complaints and denies any shortness of breath or nausea or vomiting.  No lightheadedness or dizziness.  His mentation is much improved compared to yesterday and will need to continue monitor carefully but will place on delirium precautions.  No other concerns or complaints at this time and no family present at bedside.  Objective: Vitals:   03/18/21 0130 03/18/21 0500 03/18/21 0529 03/18/21 1412  BP: (!) 99/55  (!) 114/58 (!) 122/58  Pulse: 61  61 67  Resp:   18 14  Temp:   97.8 F (36.6 C) 98.1 F (36.7 C)  TempSrc:   Oral   SpO2:   98% 95%  Weight:  68.7 kg    Height:        Intake/Output Summary (Last 24 hours) at 03/18/2021 1548 Last data filed at 03/18/2021 0900 Gross per 24 hour  Intake 1396.16 ml  Output --  Net 1396.16 ml   Filed Weights   03/17/21 0137 03/18/21 0500  Weight: 75 kg 68.7 kg   Examination: Physical Exam:  Constitutional: Thin elderly chronically ill-appearing Caucasian male currently in no acute distress appears calm and more awake today  Eyes: Lids and conjunctivae normal, sclerae anicteric  ENMT: External Ears, Nose appear normal. Grossly normal hearing.  Neck: Appears normal, supple, no cervical masses, normal ROM, no appreciable thyromegaly; no JVD Respiratory: Diminished to auscultation bilaterally, no wheezing, rales, rhonchi or crackles. Normal respiratory effort and patient is not tachypenic. No accessory muscle use.  Unlabored breathing Cardiovascular: RRR, no murmurs / rubs / gallops. S1 and S2 auscultated.  Abdomen: Soft, non-tender, non-distended.  Bowel sounds positive.  GU: Deferred. Musculoskeletal: No clubbing / cyanosis of digits/nails.  Normal strength and muscle tone.  Skin: No rashes, lesions, ulcers on limited skin evaluation. No induration; Warm and dry.  Neurologic: CN 2-12 grossly intact with no focal deficits. Romberg sign and cerebellar reflexes not assessed.  Psychiatric: Slightly impaired judgment and insight. Alert and oriented x 2. Normal  mood and appropriate affect.   Data Reviewed: I have personally reviewed following labs and imaging studies  CBC: Recent Labs  Lab 03/15/21 2230 03/17/21 0145 03/18/21 0639  WBC 5.6 10.0 3.7*  NEUTROABS 4.6 8.8* 2.9  HGB 12.9* 15.4 10.6*  HCT 38.6* 45.7 30.9*  MCV 95.8 94.8 94.5  PLT 205 184 627*   Basic Metabolic Panel: Recent Labs  Lab 03/15/21 2230 03/17/21 0145 03/18/21 0639  NA 136 136 134*  K 3.9 4.3 3.7  CL 104 100 106  CO2 24 22 19*  GLUCOSE 111* 141* 83  BUN 18 20 23   CREATININE 1.48* 1.76* 1.40*  CALCIUM 9.0 9.3 7.9*  MG  --   --  1.9  PHOS  --   --  2.2*   GFR: Estimated Creatinine Clearance: 35.4 mL/min (A) (by C-G formula based on SCr of 1.4 mg/dL (H)). Liver Function Tests: Recent Labs  Lab 03/15/21 2230 03/17/21 0145 03/18/21 0639  AST 21 36 56*  ALT 12 18 22   ALKPHOS 81 89 50  BILITOT 1.2 1.5* 1.1  PROT 6.6 7.6 4.7*  ALBUMIN 3.9 4.2 2.6*   No results for input(s): LIPASE, AMYLASE in the last 168 hours. No results for input(s): AMMONIA in the last 168 hours. Coagulation Profile: Recent Labs  Lab 03/17/21 0145  INR 1.1   Cardiac Enzymes: No results for input(s): CKTOTAL, CKMB, CKMBINDEX, TROPONINI in the last 168 hours. BNP (last 3 results) No results for input(s): PROBNP in the last 8760 hours. HbA1C: No results for input(s): HGBA1C in the last 72 hours. CBG: No results for input(s): GLUCAP in the last 168 hours. Lipid Profile: No results for input(s): CHOL, HDL, LDLCALC, TRIG, CHOLHDL, LDLDIRECT in the last 72 hours. Thyroid Function Tests: No results for input(s): TSH, T4TOTAL, FREET4, T3FREE, THYROIDAB in the last  72 hours. Anemia Panel: Recent Labs    03/18/21 0639  VITAMINB12 270  FOLATE 8.4  FERRITIN 480*  TIBC 167*  IRON 11*  RETICCTPCT 1.2   Sepsis Labs: Recent Labs  Lab 03/17/21 0137 03/17/21 0145 03/17/21 0405 03/17/21 0500 03/18/21 0639  PROCALCITON  --  0.53  --  4.48 10.60  LATICACIDVEN 4.7*  --  2.3*  --   --     Recent Results (from the past 240 hour(s))  Resp Panel by RT-PCR (Flu A&B, Covid) Nasopharyngeal Swab     Status: None   Collection Time: 03/15/21 10:03 PM   Specimen: Nasopharyngeal Swab; Nasopharyngeal(NP) swabs in vial transport medium  Result Value Ref Range Status   SARS Coronavirus 2 by RT PCR NEGATIVE NEGATIVE Final    Comment: (NOTE) SARS-CoV-2 target nucleic acids are NOT DETECTED.  The SARS-CoV-2 RNA is generally detectable in upper respiratory specimens during the acute phase of infection. The lowest concentration of SARS-CoV-2 viral copies this assay can detect is 138 copies/mL. A negative result does not preclude SARS-Cov-2 infection and should not be used as the sole basis for treatment or other patient management decisions. A negative result may occur with  improper specimen collection/handling, submission of specimen other than nasopharyngeal swab, presence of viral mutation(s) within the areas targeted by this assay, and inadequate number of viral copies(<138 copies/mL). A negative result must be combined with clinical observations, patient history, and epidemiological information. The expected result is Negative.  Fact Sheet for Patients:  EntrepreneurPulse.com.au  Fact Sheet for Healthcare Providers:  IncredibleEmployment.be  This test is no t yet approved or cleared by the [place] FDA and  has  been authorized for detection and/or diagnosis of SARS-CoV-2 by FDA under an Emergency Use Authorization (EUA). This EUA will remain  in effect (meaning this test can be used) for the duration of  the COVID-19 declaration under Section 564(b)(1) of the Act, 21 U.S.C.section 360bbb-3(b)(1), unless the authorization is terminated  or revoked sooner.       Influenza A by PCR NEGATIVE NEGATIVE Final   Influenza B by PCR NEGATIVE NEGATIVE Final    Comment: (NOTE) The Xpert Xpress SARS-CoV-2/FLU/RSV plus assay is intended as an aid in the diagnosis of influenza from Nasopharyngeal swab specimens and should not be used as a sole basis for treatment. Nasal washings and aspirates are unacceptable for Xpert Xpress SARS-CoV-2/FLU/RSV testing.  Fact Sheet for Patients: EntrepreneurPulse.com.au  Fact Sheet for Healthcare Providers: IncredibleEmployment.be  This test is not yet approved or cleared by the [place] FDA and has been authorized for detection and/or diagnosis of SARS-CoV-2 by FDA under an Emergency Use Authorization (EUA). This EUA will remain in effect (meaning this test can be used) for the duration of the COVID-19 declaration under Section 564(b)(1) of the Act, 21 U.S.C. section 360bbb-3(b)(1), unless the authorization is terminated or revoked.  Performed at [facility], [place] [address] [place], [place] [address]   Urine Culture     Status: Abnormal   Collection Time: 03/17/21  1:30 AM   Specimen: In/Out Cath Urine  Result Value Ref Range Status   Specimen Description   Final    IN/OUT CATH URINE Performed at [facility] [address] [place], [place] [address]    Special Requests   Final    NONE Performed at [facility], [place] [address], [place], [place] [address]    Culture MULTIPLE SPECIES PRESENT, SUGGEST RECOLLECTION (A)  Final   Report Status 03/18/2021 FINAL  Final  Culture, blood (Routine x 2)     Status: None (Preliminary result)   Collection Time: 03/17/21  1:45 AM   Specimen: BLOOD  Result Value Ref Range Status   Specimen Description   Final     BLOOD RIGHT ANTECUBITAL Performed at [facility] [address], [place], [place] [address]    Special Requests   Final    BOTTLES DRAWN AEROBIC AND ANAEROBIC Blood Culture adequate volume Performed at [facility] [address], [place], [place] [address]    Culture   Final    NO GROWTH 1 DAY Performed at [facility], [place] [address], [place], [place] [address]    Report Status PENDING  Incomplete  SARS CORONAVIRUS 2 (TAT 6-24 HRS) Nasopharyngeal Nasopharyngeal Swab     Status: None   Collection Time: 03/17/21  2:30 AM   Specimen: Nasopharyngeal Swab  Result Value Ref Range Status   SARS Coronavirus 2 NEGATIVE NEGATIVE Final    Comment: (NOTE) SARS-CoV-2 target nucleic acids are NOT DETECTED.  The SARS-CoV-2 RNA is generally detectable in upper and lower respiratory specimens during the acute phase of infection. Negative results do not preclude SARS-CoV-2 infection, do not rule out co-infections with other pathogens, and should not be used as the sole basis for treatment or other patient management decisions. Negative results must be combined with clinical observations, patient history, and epidemiological information. The expected result is Negative.  Fact Sheet for Patients: SugarRoll.be  Fact Sheet for Healthcare Providers: https://www.woods-mathews.com/  This test is not yet approved or cleared by the [place] FDA and  has  been authorized for detection and/or diagnosis of SARS-CoV-2 by FDA under an Emergency Use Authorization (EUA). This EUA will remain  in effect (meaning this test can be used) for the duration of the COVID-19 declaration under Se ction 564(b)(1) of the Act, 21 U.S.C. section 360bbb-3(b)(1), unless the authorization is terminated or revoked sooner.  Performed at [facility], [place] [address] [place], [place] [address]   Culture, blood (Routine x 2)      Status: None (Preliminary result)   Collection Time: 03/17/21  4:05 AM   Specimen: BLOOD  Result Value Ref Range Status   Specimen Description   Final    BLOOD BLOOD LEFT FOREARM Performed at [facility] [address], [place], [place] [address]    Special Requests   Final    BOTTLES DRAWN AEROBIC AND ANAEROBIC Blood Culture adequate volume Performed at [facility] [address] [place], [place] [address]    Culture   Final    NO GROWTH 1 DAY Performed at [facility], [place] [address] [place], [place] [address]    Report Status PENDING  Incomplete    RN Pressure Injury Documentation:     Estimated body mass index is 18.44 kg/m as calculated from the following:   Height as of this encounter: 6' 4"  (1.93 m).   Weight as of this encounter: 68.7 kg.  Malnutrition Type:   Malnutrition Characteristics:   Nutrition Interventions:     Radiology Studies: DG CHEST PORT 1 VIEW  Result Date: 03/18/2021 CLINICAL DATA:  Dementia with chronic renal insufficiency. EXAM: PORTABLE CHEST 1 VIEW COMPARISON:  03/17/2021 FINDINGS: Stable cardiomediastinal contours. Persistent opacity within the lower left lung concerning for pneumonia. Right lung appears clear. Visualized osseous structures are unremarkable. IMPRESSION: Persistent opacity within the lower left lung concerning for pneumonia. Electronically Signed   By: Kerby Moors M.D.   On: 03/18/2021 11:09   DG Chest Port 1 View  Result Date: 03/17/2021 CLINICAL DATA:  Patient here from home, brought back after discharge with complaints of fatigue. Wife unable to care for patient. patient poor historian. Hx dementia. EXAM: PORTABLE CHEST 1 VIEW COMPARISON:  Chest x-ray 03/15/2021, CT chest 06/27/2020 FINDINGS: The heart size and mediastinal contours are unchanged. Aortic calcification. Coarsened interstitial markings at the right base persistent. Retrocardiac airspace opacity with silhouetting  off left hemidiaphragm. No pulmonary edema. No pleural effusion. No pneumothorax. No acute osseous abnormality. Degenerative changes of the shoulders. Degenerative changes of the spine. IMPRESSION: Retrocardiac airspace opacity that may represent atelectasis versus infection/inflammation. Electronically Signed   By: Iven Finn M.D.   On: 03/17/2021 02:03    Scheduled Meds:  heparin  5,000 Units Subcutaneous Q8H   pneumococcal 23 valent vaccine  0.5 mL Intramuscular Tomorrow-1000   Continuous Infusions:  ceFEPime (MAXIPIME) IV 2 g (03/18/21 0629)   metronidazole 500 mg (03/18/21 1237)   vancomycin      LOS: 1 day   Kerney Elbe, DO [facility] PAGER is on [place]  If 7PM-7AM, please contact night-coverage [email or web address]

## 2021-03-18 NOTE — Progress Notes (Addendum)
Limited assessment by RN, multiple attempts. Pt cursing and hitting at staff when attempting to assess and obtain VS. Pt is unable to be redirected. Pt is only orient to self, in no apparent distress. Pt appears to be sleeping comfortably.   [id number] RN attempted reassessment. Pt appeared pleasant. RN requested to assess skin, LE and pt stated "I feel fine." When pressed more pt became loud refusing and threatening to hit.

## 2021-03-18 NOTE — Progress Notes (Signed)
Pharmacy Antibiotic Note  Jacob Rich is a 87 y.o. male admitted on 03/16/2021 with history of dementia and was brought to ED because of inability of his wife to take care of him at home. Pharmacy has been consulted to dose cefepime and vancomycin for sepsis  Plan: Vancomycin 1gm IV x 1 then 1gm q24 Cefepime 2gm q12h  Height: 6\' 4"  (193 cm) Weight: 68.7 kg (151 lb 7.3 oz) IBW/kg (Calculated) : 86.8  Temp (24hrs), Avg:99.4 F (37.4 C), Min:97.8 F (36.6 C), Max:101.4 F (38.6 C)  Recent Labs  Lab 03/15/21 2230 03/17/21 0137 03/17/21 0145 03/17/21 0405 03/18/21 0639  WBC 5.6  --  10.0  --  3.7*  CREATININE 1.48*  --  1.76*  --  1.40*  LATICACIDVEN  --  4.7*  --  2.3*  --      Estimated Creatinine Clearance: 35.4 mL/min (A) (by C-G formula based on SCr of 1.4 mg/dL (H)).    No Known Allergies  Antimicrobials this admission: 7/30 CTX x1 7/30 azith x 1 7/30 cefepime >> 7/30 vanc >> 7/30 flagyl >>  Dose adjustments this admission: 7/31 Vanc 1250mg  q36, AUC 515.4, Scr 1.76 > 1gm q24  Microbiology results: 7/30 BCx: sent 7/30 UCx: mult species - final  Thank you for allowing pharmacy to be a part of this patient's care.  8/30 PharmD [facility] [phone] 03/18/2021, 11:21 AM

## 2021-03-19 ENCOUNTER — Inpatient Hospital Stay (HOSPITAL_COMMUNITY): Payer: Medicare (Managed Care)

## 2021-03-19 DIAGNOSIS — G9340 Encephalopathy, unspecified: Secondary | ICD-10-CM | POA: Diagnosis not present

## 2021-03-19 DIAGNOSIS — N1832 Chronic kidney disease, stage 3b: Secondary | ICD-10-CM | POA: Diagnosis not present

## 2021-03-19 DIAGNOSIS — A419 Sepsis, unspecified organism: Secondary | ICD-10-CM | POA: Diagnosis not present

## 2021-03-19 DIAGNOSIS — J189 Pneumonia, unspecified organism: Secondary | ICD-10-CM | POA: Diagnosis not present

## 2021-03-19 LAB — CBC WITH DIFFERENTIAL/PLATELET
Abs Immature Granulocytes: 0.01 10*3/uL (ref 0.00–0.07)
Basophils Absolute: 0 10*3/uL (ref 0.0–0.1)
Basophils Relative: 0 %
Eosinophils Absolute: 0 10*3/uL (ref 0.0–0.5)
Eosinophils Relative: 2 %
HCT: 33.1 % — ABNORMAL LOW (ref 39.0–52.0)
Hemoglobin: 11.3 g/dL — ABNORMAL LOW (ref 13.0–17.0)
Immature Granulocytes: 0 %
Lymphocytes Relative: 13 %
Lymphs Abs: 0.4 10*3/uL — ABNORMAL LOW (ref 0.7–4.0)
MCH: 31.8 pg (ref 26.0–34.0)
MCHC: 34.1 g/dL (ref 30.0–36.0)
MCV: 93.2 fL (ref 80.0–100.0)
Monocytes Absolute: 0.3 10*3/uL (ref 0.1–1.0)
Monocytes Relative: 13 %
Neutro Abs: 2 10*3/uL (ref 1.7–7.7)
Neutrophils Relative %: 72 %
Platelets: 117 10*3/uL — ABNORMAL LOW (ref 150–400)
RBC: 3.55 MIL/uL — ABNORMAL LOW (ref 4.22–5.81)
RDW: 12.5 % (ref 11.5–15.5)
WBC: 2.7 10*3/uL — ABNORMAL LOW (ref 4.0–10.5)
nRBC: 0 % (ref 0.0–0.2)

## 2021-03-19 LAB — COMPREHENSIVE METABOLIC PANEL
ALT: 21 U/L (ref 0–44)
AST: 47 U/L — ABNORMAL HIGH (ref 15–41)
Albumin: 2.7 g/dL — ABNORMAL LOW (ref 3.5–5.0)
Alkaline Phosphatase: 52 U/L (ref 38–126)
Anion gap: 11 (ref 5–15)
BUN: 21 mg/dL (ref 8–23)
CO2: 18 mmol/L — ABNORMAL LOW (ref 22–32)
Calcium: 8 mg/dL — ABNORMAL LOW (ref 8.9–10.3)
Chloride: 107 mmol/L (ref 98–111)
Creatinine, Ser: 1.4 mg/dL — ABNORMAL HIGH (ref 0.61–1.24)
GFR, Estimated: 48 mL/min — ABNORMAL LOW (ref >60)
Glucose, Bld: 81 mg/dL (ref 70–99)
Potassium: 3.6 mmol/L (ref 3.5–5.1)
Sodium: 136 mmol/L (ref 135–145)
Total Bilirubin: 1.3 mg/dL — ABNORMAL HIGH (ref 0.3–1.2)
Total Protein: 5.2 g/dL — ABNORMAL LOW (ref 6.5–8.1)

## 2021-03-19 LAB — PHOSPHORUS: Phosphorus: 2.3 mg/dL — ABNORMAL LOW (ref 2.5–4.6)

## 2021-03-19 LAB — MAGNESIUM: Magnesium: 1.9 mg/dL (ref 1.7–2.4)

## 2021-03-19 LAB — MRSA NEXT GEN BY PCR, NASAL: MRSA by PCR Next Gen: NOT DETECTED

## 2021-03-19 MED ORDER — SODIUM CHLORIDE 0.9 % IV SOLN
2.0000 g | INTRAVENOUS | Status: DC
  Administered 2021-03-20: 2 g via INTRAVENOUS
  Filled 2021-03-19 (×3): qty 20

## 2021-03-19 MED ORDER — K PHOS MONO-SOD PHOS DI & MONO 155-852-130 MG PO TABS
500.0000 mg | ORAL_TABLET | Freq: Two times a day (BID) | ORAL | Status: AC
  Filled 2021-03-19 (×2): qty 2

## 2021-03-19 MED ORDER — SODIUM BICARBONATE 650 MG PO TABS
650.0000 mg | ORAL_TABLET | Freq: Two times a day (BID) | ORAL | Status: DC
  Administered 2021-03-19 – 2021-03-23 (×6): 650 mg via ORAL
  Filled 2021-03-19 (×8): qty 1

## 2021-03-19 NOTE — NC FL2 (Signed)
[place] MEDICAID FL2 LEVEL OF CARE SCREENING TOOL     IDENTIFICATION  Patient Name: Jacob Rich Birthdate: [birth year] Sex: male Admission Date (Current Location): 03/16/2021  [facility] and [place] Number:  Producer, television/film/video and Address:  [facility],  [address] [place], [place] [address]      Provider Number: [id number]  Attending Physician Name and Address:  Merlene Laughter, DO  Relative Name and Phone Number:  Finkbiner,[name] (Spouse)   [phone]    Current Level of Care: Hospital Recommended Level of Care: Skilled Nursing Facility Prior Approval Number:    Date Approved/Denied:   PASRR Number: [id number]  Discharge Plan: SNF    Current Diagnoses: Patient Active Problem List   Diagnosis Date Noted   Sepsis (HCC) 03/17/2021   Chronic kidney disease, stage 3b (HCC) 03/17/2021   Encephalopathy 06/29/2020   Convulsion (HCC) 06/12/2020   Intractable nausea and vomiting 06/12/2020   Acute encephalopathy 06/11/2020   Nausea & vomiting 06/11/2020   Complicated UTI (urinary tract infection) 01/05/2020   Dementia (HCC) 01/05/2020   UTI (urinary tract infection) 06/17/2017   Traumatic closed displaced fracture of shoulder with anterior dislocation with routine healing 06/10/2016   Dementia with behavioral disturbance (HCC) 04/16/2016    Orientation RESPIRATION BLADDER Height & Weight     Self  Normal External catheter Weight: 67.1 kg Height:  6\' 4"  (193 cm)  BEHAVIORAL SYMPTOMS/MOOD NEUROLOGICAL BOWEL NUTRITION STATUS     (none) Incontinent Diet (see d/c summary)  AMBULATORY STATUS COMMUNICATION OF NEEDS Skin   Extensive Assist Verbally Normal                       Personal Care Assistance Level of Assistance  Bathing, Feeding, Dressing Bathing Assistance: Maximum assistance Feeding assistance: Limited assistance Dressing Assistance: Maximum assistance     Functional Limitations Info  Sight, Hearing, Speech Sight Info: Adequate Hearing  Info: Adequate Speech Info: Adequate    SPECIAL CARE FACTORS FREQUENCY  PT (By licensed PT), OT (By licensed OT)     PT Frequency: 5X/W OT Frequency: 5X/W            Contractures Contractures Info: Not present    Additional Factors Info  Code Status, Allergies Code Status Info: DNR Allergies Info: NKA           Current Medications (03/19/2021):  This is the current hospital active medication list Current Facility-Administered Medications  Medication Dose Route Frequency Provider Last Rate Last Admin   0.9 %  sodium chloride infusion   Intravenous Continuous 05/19/2021 Jasper, DO 75 mL/hr at 03/18/21 1655 New Bag at 03/18/21 1655   acetaminophen (TYLENOL) tablet 650 mg  650 mg Oral Q6H PRN Opyd, 03/20/21, MD       Or   acetaminophen (TYLENOL) suppository 650 mg  650 mg Rectal Q6H PRN Opyd, Lavone Neri, MD   650 mg at 03/17/21 1213   cefTRIAXone (ROCEPHIN) 2 g in sodium chloride 0.9 % 100 mL IVPB  2 g Intravenous Q24H Sheikh, Omair Latif, DO       heparin injection 5,000 Units  5,000 Units Subcutaneous Q8H Opyd, 03-21-2004, MD   5,000 Units at 03/19/21 1446   iron polysaccharides (NIFEREX) capsule 150 mg  150 mg Oral Daily Sheikh, Omair Latif, DO       ondansetron [facility] [place] [facility]) tablet 4 mg  4 mg Oral Q6H PRN Opyd, [facility], MD       Or   ondansetron [facility])  injection 4 mg  4 mg Intravenous Q6H PRN Opyd, Lavone Neri, MD       phosphorus (K PHOS NEUTRAL) tablet 500 mg  500 mg Oral BID Sheikh, Omair Latif, DO       pneumococcal 23 valent vaccine (PNEUMOVAX-23) injection 0.5 mL  0.5 mL Intramuscular Tomorrow-1000 Sheikh, Omair Latif, DO       senna-docusate (Senokot-S) tablet 1 tablet  1 tablet Oral QHS PRN Opyd, Lavone Neri, MD       sodium bicarbonate tablet 650 mg  650 mg Oral BID Sheikh, Omair Latif, DO       vancomycin (VANCOCIN) IVPB 1000 mg/200 mL premix  1,000 mg Intravenous Q24H Otho Bellows, RPH 200 mL/hr at 03/18/21 1345 1,000 mg at 03/18/21 1345     Discharge  Medications: Please see discharge summary for a list of discharge medications.  Relevant Imaging Results:  Relevant Lab Results:   Additional Information [phone] [place], [place]

## 2021-03-19 NOTE — Progress Notes (Signed)
As NT attempted to obtain vitals, pt kicked, hit, grabbed and scratched NT. Pt stated "Get the fuck out of this room you crazy person". NT attempted to redirect pt. Pt continued to be verbally and physically aggressive NT. CN notified

## 2021-03-19 NOTE — Evaluation (Signed)
Occupational Therapy Evaluation Patient Details Name: Jacob Rich MRN: [id number] DOB: [birth year] Today's Date: 03/19/2021    History of Present Illness patient is a 87 year old Caucasian male  who presented to the ED with lethargy admitted with sepsis d/t pna.  PMH: dementia and chronic renal insufficiency with a CKD stage IIIb, history of MRSA infection as well as other comorbidities.   At baseline he is usually confused but has been able to ambulate independently and perform many of his ADLs until last 2 to 3 months over which she had significant worsening in his cognition and his independence.   Clinical Impression   Patient is a 87 year old male who was admitted for above. Patient was previously living at home with wife with min A for ADLs and SUP for functional mobility per last OT note. Currently, patient is min guard for rolling in bed with min A for supine to sit on edge of bed. Patient was mod A for standing up and standing balance with rolling walker with inability to follow cues to take small steps towards head to bed. Patient was noted to have decreased standing balance, decreased standing tolerance, decreased memory, increased pain in LUE with movement impacting participation in ADL tasks. Patient would continue to benefit from skilled OT services at this time while admitted and after d/c to address noted deficits in order to improve overall safety and independence in ADLs.      Follow Up Recommendations  SNF    Equipment Recommendations  None recommended by OT    Recommendations for Other Services       Precautions / Restrictions Precautions Precautions: Fall Restrictions Weight Bearing Restrictions: No      Mobility Bed Mobility Overal bed mobility: Needs Assistance Bed Mobility: Supine to Sit;Sit to Supine     Supine to sit: Min assist Sit to supine: Min assist   General bed mobility comments: assist fully elevate trunk, assist to complete lifting LEs on to bed,  incr time needed    Transfers                 General transfer comment: deferred for safety at this time.    Balance Overall balance assessment: Needs assistance Sitting-balance support: Single extremity supported;Feet supported Sitting balance-Leahy Scale: Fair Sitting balance - Comments: patient was able to sit on edge of bed with SUP with no leaning noted Postural control: Left lateral lean   Standing balance-Leahy Scale: Poor Standing balance comment: NT with +1 assist                           ADL either performed or assessed with clinical judgement   ADL Overall ADL's : Needs assistance/impaired Eating/Feeding: NPO   Grooming: Wash/dry face;Wash/dry hands;Sitting   Upper Body Bathing: Moderate assistance;Bed level   Lower Body Bathing: Moderate assistance;Bed level   Upper Body Dressing : Min guard   Lower Body Dressing: Moderate assistance;Bed level Lower Body Dressing Details (indicate cue type and reason): patient was able to don/doff socks at bed level. patient would need increased assistance for clothing maangement in standing with current balance deficits. Toilet Transfer: +2 for physical assistance;+2 for safety/equipment;RW;Moderate assistance   Toileting- Clothing Manipulation and Hygiene: Maximal assistance;Sit to/from stand       Functional mobility during ADLs: Total assistance General ADL Comments: patient attempted to take small step towards head of bed with patient noted to take larger step with LLE than needed fowards instead of  to side as per cues. patient reported being " off balance" with movement and needed mod A to regain standing balance with RW. patient was educated on proper foot movement with patient unable to follow commands to complete task at this time.     Vision Patient Visual Report: No change from baseline       Perception     Praxis      Pertinent Vitals/Pain Pain Assessment: Faces Faces Pain Scale: Hurts a  little bit Pain Location: L shoulder with movement Pain Descriptors / Indicators: Grimacing;Guarding Pain Intervention(s): Limited activity within patient's tolerance;Monitored during session     Hand Dominance Right   Extremity/Trunk Assessment Upper Extremity Assessment Upper Extremity Assessment: LUE deficits/detail LUE Deficits / Details: shoulder flexion limited to ~ 70 degrees d/t pain. sensitive to touch.   Lower Extremity Assessment Lower Extremity Assessment: Generalized weakness (AAROM grossly WFL bil LEs)   Cervical / Trunk Assessment Cervical / Trunk Assessment: Kyphotic   Communication Communication Communication: No difficulties   Cognition Arousal/Alertness: Awake/alert Behavior During Therapy: WFL for tasks assessed/performed Overall Cognitive Status: No family/caregiver present to determine baseline cognitive functioning                                 General Comments: h/o dementia. follows one step commands with incr time   General Comments       Exercises     Shoulder Instructions      Home Living Family/patient expects to be discharged to:: Unsure Living Arrangements: Spouse/significant other Available Help at Discharge: Family;Available 24 hours/day Type of Home: Mobile home Home Access: Stairs to enter Entrance Stairs-Number of Steps: 5 Entrance Stairs-Rails: Left Home Layout: One level               Home Equipment: Grab bars - toilet;Tub bench   Additional Comments: inf from previous encounter, no family present at time of OT eval, pt is unreliable historian      Prior Functioning/Environment Level of Independence: Needs assistance    ADL's / Homemaking Assistance Needed: patient was noted in previous evaluation by OT to be min A for ADL tasks at home with SUP for functional mobility per wife report.   Comments: recent decline in function last 2-70mos, was amb at home however decr abilityto perform ADLs per chart         OT Problem List: Decreased strength;Impaired balance (sitting and/or standing);Decreased activity tolerance;Decreased coordination;Decreased knowledge of use of DME or AE;Impaired UE functional use;Decreased safety awareness      OT Treatment/Interventions: Self-care/ADL training;DME and/or AE instruction;Therapeutic activities;Balance training;Patient/family education;Therapeutic exercise    OT Goals(Current goals can be found in the care plan section) Acute Rehab OT Goals Patient Stated Goal: "what?' OT Goal Formulation: With patient Time For Goal Achievement: 03/19/21 Potential to Achieve Goals: Fair  OT Frequency: Min 2X/week   Barriers to D/C:    patient to d/c home with wife with limited assistance       Co-evaluation              AM-PAC OT "6 Clicks" Daily Activity     Outcome Measure Help from another person eating meals?: Total (NPO) Help from another person taking care of personal grooming?: A Little Help from another person toileting, which includes using toliet, bedpan, or urinal?: A Lot Help from another person bathing (including washing, rinsing, drying)?: A Lot Help from another person to put on and taking  off regular upper body clothing?: A Little Help from another person to put on and taking off regular lower body clothing?: A Lot 6 Click Score: 13   End of Session Equipment Utilized During Treatment: Rolling walker;Gait belt Nurse Communication: Other (comment) (nurse cleared patient to participate in session.)  Activity Tolerance: Patient tolerated treatment well Patient left: in bed;with call bell/phone within reach;with chair alarm set  OT Visit Diagnosis: Muscle weakness (generalized) (M62.81);Pain Pain - Right/Left: Left Pain - part of body: Shoulder                Time: 8811-0315 OT Time Calculation (min): 17 min Charges:  OT General Charges $OT Visit: 1 Visit OT Evaluation $OT Eval Low Complexity: 1 Low  Viann Shove,  MS Acute Rehabilitation Department Office# [phone] Pager# [phone]   [facility] Guest Norena Bratton 03/19/2021, 2:17 PM

## 2021-03-19 NOTE — Progress Notes (Signed)
Initial Nutrition Assessment  DOCUMENTATION CODES:   Underweight  INTERVENTION:   -Will monitor for diet advancement  -Recommend Ensure Enlive po BID, each supplement provides 350 kcal and 20 grams of protein -Magic cup TID with meals, each supplement provides 290 kcal and 9 grams of protein -Multivitamin with minerals daily   NUTRITION DIAGNOSIS:   Increased nutrient needs related to acute illness as evidenced by estimated needs.  GOAL:   Patient will meet greater than or equal to 90% of their needs  MONITOR:   Diet advancement, Labs, Weight trends, I & O's  REASON FOR ASSESSMENT:   Consult Assessment of nutrition requirement/status, Poor PO  ASSESSMENT:   87 year old Caucasian male with a past medical history significant for but not limited to dementia and chronic renal insufficiency with a CKD stage IIIb, history of MRSA infection as well as other comorbidities who presented to the ED with lethargy.  Patient currently alert/oriented x 1.  Per chart review, pt is confused at baseline but mental status had worsened over the past 2-3 months. Currently NPO until more alert. Recommend protein supplements once diet advanced. Suspect some degree of malnutrition.  Per weight records, pt has lost 20 lbs since 06/11/20 (11% wt loss x 9 months, insignificant for time frame).   Medications: Niferex  Labs reviewed:  Low Phos  NUTRITION - FOCUSED PHYSICAL EXAM:  Deferred -AMS  Diet Order:   Diet Order             Diet NPO time specified  Diet effective now                   EDUCATION NEEDS:   No education needs have been identified at this time  Skin:  Skin Assessment: Reviewed RN Assessment  Last BM:  8/1 -type 6  Height:   Ht Readings from Last 1 Encounters:  03/17/21 6\' 4"  (1.93 m)    Weight:   Wt Readings from Last 1 Encounters:  03/19/21 67.1 kg    BMI:  Body mass index is 18.01 kg/m.  Estimated Nutritional Needs:   Kcal:   1700-1900  Protein:  75-85g  Fluid:  1.9L/day   05/19/21, MS, RD, LDN Inpatient Clinical Dietitian Contact information available via Amion

## 2021-03-19 NOTE — Progress Notes (Signed)
PROGRESS NOTE    Penny Frisbie  QQV:[id number] DOB: Jan 20, 1931 DOA: 03/16/2021 PCP: Lucianne Lei, MD   Brief Narrative:  Patient is an 87 year old Caucasian male with a past medical history significant for but not limited to dementia and chronic renal insufficiency with a CKD stage IIIb, history of MRSA infection as well as other comorbidities who presented to the ED with lethargy.  At baseline he is usually confused but has been able to ambulate independently and perform many of his ADLs until last 2 to 3 months over which she had significant worsening in his cognition and his independence.  He then acutely worsened over the past 2 to 3 days with marked increased confusion and fatigue.  He was sent to the ED on 03/15/2021 and case manager met was engaged and arranging home health services.  He returned home with his wife but came back to the ED yesterday due to lethargy though he is not voicing any specific complaints.  He was noted to have episode of nausea with nonbloody vomiting in the ED and he was found to be febrile in the ED with tachycardia.  Chest x-ray showed a retrocardiac airspace opacity.  Chemistry panel was notable for an elevated creatinine of 1.6 and his lactic acid level was 4.7.  Blood and urine cultures were obtained and he was given 3.5 L of lactated Ringer's and started on antibiotics with IV ceftriaxone and azithromycin. Antibiotics were escalated to IV cefepime, IV Flagyl, IV vancomycin and procalcitonin continues to worsen but renal function is improving.  Patient's mentation is also improving today and he is more alert. Will changed diet to Soft diet and obtain SLP evaluation  Assessment & Plan:   Principal Problem:   Sepsis ([place]) Active Problems:   Dementia with behavioral disturbance (HCC)   Acute encephalopathy   Chronic kidney disease, stage 3b (HCC)  Severe sepsis likely secondary to pneumonia present on admission -Presents with 2 days of increased confusion and  lethargy, acutely worse yesterday, found to have temp 39.9 C, slightly tachycardic, and initial lactate 4.7 -Sepsis physiology is improving now as he has no further temps overnight  -UA yesterday not suggestive of infection; question of possible PNA on CXR though sat is close to 100% on rm air and respirations unlabored; abd exam benign; no meningismus; no apparent cellulitis  -PCT went from 0.53 -> 4.48 is further worsened and is now 10.60 -LA went from 4.7 -> 2.3 -Blood cultures collected in ED, 30 cc/kg LR was given, and empiric Rocephin and azithromycin started   -Continue to Trend lactate, -Checking COVID PCR and was negative -U/A showed Hazy Appearance with Small Hgb Trace Leukocytes, and Rare Bacteria but showed 0-5 RBCs per high-power field and 0-5 WBCs; Urine Cx showed multiple species Present so  -Check/Trend Procalcitonin -CXR today showed "Continued left lower lobe airspace opacity and right basilar opacity, not significantly changed. Findings concerning for pneumonia."  -Initially Broadened antibiotics but now will de-escalate and stop IV vancomycin if MRSA PCR is negative,.  We will stop IV Flagyl and de-escalate IV cefepime to IV ceftriaxone -Continue to Follow cultures and clinical course   -C/w IVF with NS at 75 mL/hr and stop after 12 more hours -WBC is trending down and went from 8.0 is now 2.7 -Patient will need PT OT to further evaluate and treat once he is improved and they are recommending SNF   Acute Encephalopathy, improving -Pt's wife notes decline in mental status over the past 2-3 months but worsened  acutely over the past 2-3 days  -No acute findings on head CT 03/15/21, suspect acute change related to fever/dehydration/possible sepsis  -Continue IVF hydration and reduce rate to 75 mL/hr for now and stop after 12 more hours and advance Diet to SOFT diet and check SLP -C/w Sepsis workup and empiric antibiotics, expand workup if fails to improve with fluids and treatment  of possible infection  -Mentation is improving but will continue with delirium precautions -PT/OT Recommending SNF   Dementia  -Significant decline over the past 2-3 months, requiring increasing amounts of assistance with ADLs per pt's wife  -C/w Supportive care -Delirium precautions   CKD IIIb  Metabolic Acidosis -SCr is 1.76 on admission, up from 1.48 the day prior; now patient's BUNs/creatinine is 21/1.40 -He is being fluid-resuscitated in ED  -Mild metabolic acidosis with a CO2 of 18, anion gap of 11, chloride level 107 -Further IV fluid resuscitation has now been stopped but will resume maintenance IV fluid hydration with normal saline at 75 MLS per hour and will stop after 12 more hours -Avoid nephrotoxic medications if possible, contrast dyes, hypotension and renally adjust medications -Repeat CMP in the a.m.   Normocytic Anemia -Patient's Hgb/Hct went from 12.9/38.6 -> 15.4/45.7 and is further dropped down to 10.6/30.9 in the setting of dilution yesterday but has improved and is now 11.3/33.1 -Checked anemia panel and showed an iron level of 11, U IBC 156, TIBC 167, saturation ratios of 7%, ferritin level 480, folate level 8.4, and vitamin B12 level of 270 -Will start p.o. Niferex 150 mg daily -Continue to monitor for signs and symptoms of bleeding; no overt bleeding noted -Repeat CBC in a.m.  Hyperbilirubinemia -Mild -The Patient's T bili went from 1.2  -> 1.5 -> 1.1 -> 1.3 -Likely reactive and will need to continue monitor and trend and repeat CMP in the a.m.  Thrombocytopenia -Likely in the setting of infection and antibiotic usage -The patient's platelet count has gone from 184 is now 106 yesterday and today is improved to 117 -Continue monitor for signs and symptoms bleeding; currently no overt bleeding noted -Repeat CBC in a.m.  Hyponatremia -Patient's sodium is now back up to 136 -We will continue IV fluids with sodium chloride at 75 mils per hour for another 12  more hours -Continue monitor and trend and repeat CMP in a.m.  Elevated AST -Mild -The patient's AST went from 36 -> 56 -> 47 -continue monitor and trend and repeat hepatic function panel in the a.m. and if necessary will obtain a right upper quadrant ultrasound and a acute hepatitis panel -repeat CMP in a.m.  Metabolic Acidosis -Mild -Patient CO2 is now 18, anion gap is 11, chloride level is 106 -Continue maintenance IV fluid hydration with normal saline at 75 MLS per hour for another 12 more hours -Also start sodium bicarbonate supplementation was 650 mg p.o. twice daily -Continue monitor and trend and repeat CMP in a.m.  Hypophosphatemia -Patient's Phos level is 2.3 -Replete with p.o. K-Phos 500 mg p.o. twice daily x2 doses again  -Continue to monitor and replete as necessary -Repeat phosphorus level in a.m.  DVT prophylaxis: Heparin injection 5000 units subcu every 8 hours Code Status: DO NOT RESUSCITATE Family Communication: No family present at bedside  Disposition Plan: Need further clinical improvement and evaluation by PT and OT; PT OT recommending SNF  Status is: Inpatient  Remains inpatient appropriate because:Unsafe d/c plan, IV treatments appropriate due to intensity of illness or inability to take PO, and Inpatient  level of care appropriate due to severity of illness  Dispo: The patient is from: Home              Anticipated d/c is to:  SNF              Patient currently is not medically stable to d/c.   Difficult to place patient No  Consultants:  None  Procedures: None  Antimicrobials:  Anti-infectives (From admission, onward)    Start     Dose/Rate Route Frequency Ordered Stop   03/19/21 2000  cefTRIAXone (ROCEPHIN) 2 g in sodium chloride 0.9 % 100 mL IVPB        2 g 200 mL/hr over 30 Minutes Intravenous Every 24 hours 03/19/21 1058     03/18/21 2000  vancomycin (VANCOREADY) IVPB 1250 mg/250 mL  Status:  Discontinued        1,250 mg 166.7 mL/hr over  90 Minutes Intravenous Every 36 hours 03/17/21 0607 03/18/21 1125   03/18/21 1300  vancomycin (VANCOCIN) IVPB 1000 mg/200 mL premix        1,000 mg 200 mL/hr over 60 Minutes Intravenous Every 24 hours 03/18/21 1125     03/17/21 1800  ceFEPIme (MAXIPIME) 2 g in sodium chloride 0.9 % 100 mL IVPB  Status:  Discontinued        2 g 200 mL/hr over 30 Minutes Intravenous Every 12 hours 03/17/21 0550 03/19/21 1057   03/17/21 0430  ceFEPIme (MAXIPIME) 2 g in sodium chloride 0.9 % 100 mL IVPB        2 g 200 mL/hr over 30 Minutes Intravenous  Once 03/17/21 0418 03/17/21 0553   03/17/21 0430  metroNIDAZOLE (FLAGYL) IVPB 500 mg  Status:  Discontinued        500 mg 100 mL/hr over 60 Minutes Intravenous Every 8 hours 03/17/21 0418 03/19/21 1056   03/17/21 0430  vancomycin (VANCOCIN) IVPB 1000 mg/200 mL premix        1,000 mg 200 mL/hr over 60 Minutes Intravenous  Once 03/17/21 0418 03/17/21 0655   03/17/21 0230  cefTRIAXone (ROCEPHIN) 2 g in sodium chloride 0.9 % 100 mL IVPB        2 g 200 mL/hr over 30 Minutes Intravenous  Once 03/17/21 0225 03/17/21 0300   03/17/21 0230  azithromycin (ZITHROMAX) 500 mg in sodium chloride 0.9 % 250 mL IVPB        500 mg 250 mL/hr over 60 Minutes Intravenous  Once 03/17/21 0225 03/17/21 0400        Subjective: Seen and examined at bedside and his mentation is improving and he is awake and alert and pleasant.  He denies any specific complaints denies any shortness of breath.  No urinary discomfort or burning.  No lightheadedness or dizziness.  No other concerns or complaints at this time and PT OT recommending SNF.  Vitals:   03/18/21 1412 03/19/21 0331 03/19/21 0522 03/19/21 1357  BP: (!) 122/58  (!) 130/59 123/68  Pulse: 67  65 66  Resp: _0 Temp: 98.1 F (36.7 C)  98.8 F (37.1 C) 98 F (36.7 C)  TempSrc:   Oral Oral  SpO2: 95%  96% 98%  Weight:  67.1 kg    Height:        Intake/Output Summary (Last 24 hours) at 03/19/2021 1626 Last data filed at  03/19/2021 1506 Gross per 24 hour  Intake 952.09 ml  Output 1250 ml  Net -297.91 ml    [employer]  03/17/21 0137 03/18/21 0500 03/19/21 0331  Weight: 75 kg 68.7 kg 67.1 kg   Examination: Physical Exam:  Constitutional: Thin elderly chronically ill-appearing Caucasian male currently no acute distress appears calm and he is awake and talkative Eyes: Lids and conjunctivae normal, sclerae anicteric  ENMT: External Ears, Nose appear normal. Grossly normal hearing.  Neck: Appears normal, supple, no cervical masses, normal ROM, no appreciable thyromegaly; no JVD Respiratory: Diminished to auscultation bilaterally, no wheezing, rales, rhonchi or crackles. Normal respiratory effort and patient is not tachypenic. No accessory muscle use.  Unlabored breathing Cardiovascular: RRR, no murmurs / rubs / gallops. S1 and S2 auscultated.  No appreciable extremity edema Abdomen: Soft, non-tender, non-distended. Bowel sounds positive.  GU: Deferred. Musculoskeletal: No clubbing / cyanosis of digits/nails. No joint deformity upper and lower extremities.  Skin: No rashes, lesions, ulcers on limited skin evaluation. No induration; Warm and dry.  Neurologic: CN 2-12 grossly intact with no focal deficits.  Romberg sign cerebellar reflexes not assessed.  Psychiatric: Slightly impaired judgment and insight. Alert and oriented x 2. Normal mood and appropriate affect.   Data Reviewed: I have personally reviewed following labs and imaging studies  CBC: Recent Labs  Lab 03/15/21 2230 03/17/21 0145 03/18/21 0639 03/19/21 0956  WBC 5.6 10.0 3.7* 2.7*  NEUTROABS 4.6 8.8* 2.9 2.0  HGB 12.9* 15.4 10.6* 11.3*  HCT 38.6* 45.7 30.9* 33.1*  MCV 95.8 94.8 94.5 93.2  PLT 205 184 106* 117*    Basic Metabolic Panel: Recent Labs  Lab 03/15/21 2230 03/17/21 0145 03/18/21 0639 03/19/21 0956  NA 136 136 134* 136  K 3.9 4.3 3.7 3.6  CL 104 100 106 107  CO2 24 22 19* 18*  GLUCOSE 111* 141* 83 81  BUN _0 CREATININE 1.48* 1.76* 1.40* 1.40*  CALCIUM 9.0 9.3 7.9* 8.0*  MG  --   --  1.9 1.9  PHOS  --   --  2.2* 2.3*    GFR: Estimated Creatinine Clearance: 34.6 mL/min (A) (by C-G formula based on SCr of 1.4 mg/dL (H)). Liver Function Tests: Recent Labs  Lab 03/15/21 2230 03/17/21 0145 03/18/21 0639 03/19/21 0956  AST 21 36 56* 47*  ALT _1 ALKPHOS 81 89 50 52  BILITOT 1.2 1.5* 1.1 1.3*  PROT 6.6 7.6 4.7* 5.2*  ALBUMIN 3.9 4.2 2.6* 2.7*    No results for input(s): LIPASE, AMYLASE in the last 168 hours. No results for input(s): AMMONIA in the last 168 hours. Coagulation Profile: Recent Labs  Lab 03/17/21 0145  INR 1.1    Cardiac Enzymes: No results for input(s): CKTOTAL, CKMB, CKMBINDEX, TROPONINI in the last 168 hours. BNP (last 3 results) No results for input(s): PROBNP in the last 8760 hours. HbA1C: No results for input(s): HGBA1C in the last 72 hours. CBG: No results for input(s): GLUCAP in the last 168 hours. Lipid Profile: No results for input(s): CHOL, HDL, LDLCALC, TRIG, CHOLHDL, LDLDIRECT in the last 72 hours. Thyroid Function Tests: No results for input(s): TSH, T4TOTAL, FREET4, T3FREE, THYROIDAB in the last 72 hours. Anemia Panel: Recent Labs    03/18/21 0639  VITAMINB12 270  FOLATE 8.4  FERRITIN 480*  TIBC 167*  IRON 11*  RETICCTPCT 1.2    Sepsis Labs: Recent Labs  Lab 03/17/21 0137 03/17/21 0145 03/17/21 0405 03/17/21 0500 03/18/21 0639  PROCALCITON  --  0.53  --  4.48 10.60  LATICACIDVEN 4.7*  --  2.3*  --   --  Recent Results (from the past 240 hour(s))  Resp Panel by RT-PCR (Flu A&B, Covid) Nasopharyngeal Swab     Status: None   Collection Time: 03/15/21 10:03 PM   Specimen: Nasopharyngeal Swab; Nasopharyngeal(NP) swabs in vial transport medium  Result Value Ref Range Status   SARS Coronavirus 2 by RT PCR NEGATIVE NEGATIVE Final    Comment: (NOTE) SARS-CoV-2 target nucleic acids are NOT DETECTED.  The  SARS-CoV-2 RNA is generally detectable in upper respiratory specimens during the acute phase of infection. The lowest concentration of SARS-CoV-2 viral copies this assay can detect is 138 copies/mL. A negative result does not preclude SARS-Cov-2 infection and should not be used as the sole basis for treatment or other patient management decisions. A negative result may occur with  improper specimen collection/handling, submission of specimen other than nasopharyngeal swab, presence of viral mutation(s) within the areas targeted by this assay, and inadequate number of viral copies(<138 copies/mL). A negative result must be combined with clinical observations, patient history, and epidemiological information. The expected result is Negative.  Fact Sheet for Patients:  EntrepreneurPulse.com.au  Fact Sheet for Healthcare Providers:  IncredibleEmployment.be  This test is no t yet approved or cleared by the [place] FDA and  has been authorized for detection and/or diagnosis of SARS-CoV-2 by FDA under an Emergency Use Authorization (EUA). This EUA will remain  in effect (meaning this test can be used) for the duration of the COVID-19 declaration under Section 564(b)(1) of the Act, 21 U.S.C.section 360bbb-3(b)(1), unless the authorization is terminated  or revoked sooner.       Influenza A by PCR NEGATIVE NEGATIVE Final   Influenza B by PCR NEGATIVE NEGATIVE Final    Comment: (NOTE) The Xpert Xpress SARS-CoV-2/FLU/RSV plus assay is intended as an aid in the diagnosis of influenza from Nasopharyngeal swab specimens and should not be used as a sole basis for treatment. Nasal washings and aspirates are unacceptable for Xpert Xpress SARS-CoV-2/FLU/RSV testing.  Fact Sheet for Patients: EntrepreneurPulse.com.au  Fact Sheet for Healthcare Providers: IncredibleEmployment.be  This test is not yet approved or  cleared by the [place] FDA and has been authorized for detection and/or diagnosis of SARS-CoV-2 by FDA under an Emergency Use Authorization (EUA). This EUA will remain in effect (meaning this test can be used) for the duration of the COVID-19 declaration under Section 564(b)(1) of the Act, 21 U.S.C. section 360bbb-3(b)(1), unless the authorization is terminated or revoked.  Performed at [facility], [place] [address], [place], [place] [address]   Urine Culture     Status: Abnormal   Collection Time: 03/17/21  1:30 AM   Specimen: In/Out Cath Urine  Result Value Ref Range Status   Specimen Description   Final    IN/OUT CATH URINE Performed at [facility] [address], [place], [place] [address]    Special Requests   Final    NONE Performed at [facility], [place] [address] [place], [place] [address]    Culture MULTIPLE SPECIES PRESENT, SUGGEST RECOLLECTION (A)  Final   Report Status 03/18/2021 FINAL  Final  Culture, blood (Routine x 2)     Status: None (Preliminary result)   Collection Time: 03/17/21  1:45 AM   Specimen: BLOOD  Result Value Ref Range Status   Specimen Description   Final    BLOOD RIGHT ANTECUBITAL Performed at [facility] [address] [place], [place] [address]    Special Requests   Final  BOTTLES DRAWN AEROBIC AND ANAEROBIC Blood Culture adequate volume Performed at [facility] [address] [place], [place] [address]    Culture   Final    NO GROWTH 2 DAYS Performed at [facility] [address] [place], [place] [address]    Report Status PENDING  Incomplete  SARS CORONAVIRUS 2 (TAT 6-24 HRS) Nasopharyngeal Nasopharyngeal Swab     Status: None   Collection Time: 03/17/21  2:30 AM   Specimen: Nasopharyngeal Swab  Result Value Ref Range Status   SARS Coronavirus 2 NEGATIVE NEGATIVE Final    Comment: (NOTE) SARS-CoV-2 target nucleic  acids are NOT DETECTED.  The SARS-CoV-2 RNA is generally detectable in upper and lower respiratory specimens during the acute phase of infection. Negative results do not preclude SARS-CoV-2 infection, do not rule out co-infections with other pathogens, and should not be used as the sole basis for treatment or other patient management decisions. Negative results must be combined with clinical observations, patient history, and epidemiological information. The expected result is Negative.  Fact Sheet for Patients: SugarRoll.be  Fact Sheet for Healthcare Providers: https://www.woods-mathews.com/  This test is not yet approved or cleared by the [place] FDA and  has been authorized for detection and/or diagnosis of SARS-CoV-2 by FDA under an Emergency Use Authorization (EUA). This EUA will remain  in effect (meaning this test can be used) for the duration of the COVID-19 declaration under Se ction 564(b)(1) of the Act, 21 U.S.C. section 360bbb-3(b)(1), unless the authorization is terminated or revoked sooner.  Performed at [facility], [place] [address], [place], [place] [address]   Culture, blood (Routine x 2)     Status: None (Preliminary result)   Collection Time: 03/17/21  4:05 AM   Specimen: BLOOD  Result Value Ref Range Status   Specimen Description   Final    BLOOD BLOOD LEFT FOREARM Performed at [facility] [address], [place], [place] [address]    Special Requests   Final    BOTTLES DRAWN AEROBIC AND ANAEROBIC Blood Culture adequate volume Performed at [facility] [address], [place], [place] [address]    Culture   Final    NO GROWTH 2 DAYS Performed at [facility] [address], [place], [place] [address]    Report Status PENDING  Incomplete     RN Pressure Injury Documentation:     Estimated body mass index is 18.01 kg/m as calculated from the  following:   Height as of this encounter: _0  (1.93 m).   Weight as of this encounter: 67.1 kg.  Malnutrition Type: Nutrition Problem: Increased nutrient needs Etiology: acute illness Malnutrition Characteristics: Signs/Symptoms: estimated needs Nutrition Interventions: Interventions: Ensure Enlive (each supplement provides 350kcal and 20 grams of protein), MVI, Magic cup   Radiology Studies: DG CHEST PORT 1 VIEW  Result Date: 03/19/2021 CLINICAL DATA:  Shortness of breath EXAM: PORTABLE CHEST 1 VIEW COMPARISON:  03/18/2021 FINDINGS: The heart is normal size. Mediastinal contours within normal limits. Left lower lobe airspace opacity and right basilar opacity again noted, unchanged. No effusions. No acute bony abnormality. IMPRESSION: Continued left lower lobe airspace opacity and right basilar opacity, not significantly changed. Findings concerning for pneumonia. Electronically Signed   By: Rolm Baptise M.D.   On: 03/19/2021 08:10   DG CHEST PORT 1 VIEW  Result Date: 03/18/2021 CLINICAL DATA:  Dementia with chronic renal insufficiency. EXAM: PORTABLE CHEST 1 VIEW COMPARISON:  03/17/2021 FINDINGS: Stable cardiomediastinal contours. Persistent opacity within the lower left lung concerning for pneumonia. Right lung appears clear. Visualized osseous structures are unremarkable. IMPRESSION: Persistent opacity within the lower left lung concerning for pneumonia. Electronically Signed   By: Kerby Moors M.D.   On: 03/18/2021 11:09    Scheduled Meds:  heparin  5,000 Units Subcutaneous Q8H   iron polysaccharides  150 mg Oral Daily   pneumococcal 23 valent vaccine  0.5 mL Intramuscular Tomorrow-1000   Continuous Infusions:  sodium chloride 75 mL/hr at 03/18/21 1655   cefTRIAXone (ROCEPHIN)  IV     vancomycin 1,000 mg (03/18/21 1345)    LOS: 2 days   Kerney Elbe, DO [facility] PAGER is on AMION  If 7PM-7AM, please contact night-coverage [email or web address]

## 2021-03-19 NOTE — Progress Notes (Signed)
Pt would not let this nurse assess him , I could not assess the IV he had pulled out, Tele box removed as order had expired. Unable to give 8pm antibiotics.Darcel Smalling AMION page sent

## 2021-03-19 NOTE — Evaluation (Signed)
Physical Therapy Re-Evaluation Patient Details Name: Jacob Rich MRN: [id number] DOB: [birth year] Today's Date: 03/19/2021   History of Present Illness  87 year old Caucasian male  who presented to the ED with lethargy admitted with sepsis d/t pna.  PMH: dementia and chronic renal insufficiency with a CKD stage IIIb, history of MRSA infection as well as other comorbidities.   At baseline he is usually confused but has been able to ambulate independently and perform many of his ADLs until last 2 to 3 months over which she had significant worsening in his cognition and his independence.  Clinical Impression  Pt admitted with above diagnosis.  Pt sat EOB with mod assist, min assist to return to supine. Cooperative and able to follow one step commands with incr time today. No family present to determine baseline, caregiver support available etc. Per chart pt with recent functional decline therefore recommend SNF unless family desires otherwise.    Pt currently with functional limitations due to the deficits listed below (see PT Problem List). Pt will benefit from skilled PT to increase their independence and safety with mobility to allow discharge to the venue listed below.       Follow Up Recommendations SNF    Equipment Recommendations  None recommended by PT    Recommendations for Other Services       Precautions / Restrictions Precautions Precautions: Fall Restrictions Weight Bearing Restrictions: No      Mobility  Bed Mobility Overal bed mobility: Needs Assistance Bed Mobility: Supine to Sit;Sit to Supine     Supine to sit: Min assist;Mod assist Sit to supine: Min assist   General bed mobility comments: assist fully elevate trunk, assist to complete lifting LEs on to bed, incr time needed    Transfers                    Ambulation/Gait                Stairs            Wheelchair Mobility    Modified Rankin (Stroke Patients Only)        Balance Overall balance assessment: Needs assistance Sitting-balance support: Single extremity supported;Feet supported Sitting balance-Leahy Scale: Poor Sitting balance - Comments: requires assist to maintain midline, able to sit without support however with L lateral lean, unable to correct without assist Postural control: Left lateral lean     Standing balance comment: NT with +1 assist                             Pertinent Vitals/Pain Pain Assessment: Faces Faces Pain Scale: Hurts little more Pain Location: L shoulder with movement Pain Intervention(s): Limited activity within patient's tolerance;Monitored during session    Home Living Family/patient expects to be discharged to:: Unsure Living Arrangements: Spouse/significant other Available Help at Discharge: Family;Available 24 hours/day Type of Home: Mobile home Home Access: Stairs to enter   Entrance Stairs-Number of Steps: 5 Home Layout: One level Home Equipment: Grab bars - toilet;Tub bench Additional Comments: inf from previous encounter, no family present at time of PT eval, pt is unreliable historian    Prior Function           Comments: recent decline in function last 2-87mos, was amb at home however decr abilityto perform ADLs per chart     Hand Dominance        Extremity/Trunk Assessment   Upper Extremity Assessment Upper Extremity Assessment:  Generalized weakness;LUE deficits/detail LUE Deficits / Details: shoulder flexion limited to ~ 70 degrees d/t pain.    Lower Extremity Assessment Lower Extremity Assessment: Generalized weakness (AAROM grossly WFL bil LEs)       Communication   Communication: No difficulties  Cognition Arousal/Alertness: Awake/alert Behavior During Therapy: WFL for tasks assessed/performed Overall Cognitive Status: No family/caregiver present to determine baseline cognitive functioning                                 General Comments: h/o  dementia. follows one step commands with incr time      General Comments      Exercises     Assessment/Plan    PT Assessment Patient needs continued PT services  PT Problem List Decreased strength;Decreased balance;Decreased cognition;Decreased activity tolerance;Decreased mobility       PT Treatment Interventions Therapeutic activities;Gait training;Functional mobility training;Balance training;Therapeutic exercise;Patient/family education    PT Goals (Current goals can be found in the Care Plan section)  Acute Rehab PT Goals PT Goal Formulation: Patient unable to participate in goal setting Time For Goal Achievement: 04/02/21 Potential to Achieve Goals: Fair    Frequency Min 2X/week   Barriers to discharge        Co-evaluation               AM-PAC PT "6 Clicks" Mobility  Outcome Measure Help needed turning from your back to your side while in a flat bed without using bedrails?: A Lot Help needed moving from lying on your back to sitting on the side of a flat bed without using bedrails?: A Lot Help needed moving to and from a bed to a chair (including a wheelchair)?: Total Help needed standing up from a chair using your arms (e.g., wheelchair or bedside chair)?: Total Help needed to walk in hospital room?: Total Help needed climbing 3-5 steps with a railing? : Total 6 Click Score: 8    End of Session   Activity Tolerance: Patient limited by fatigue;Patient tolerated treatment well Patient left: in bed;with call bell/phone within reach;with bed alarm set   PT Visit Diagnosis: Other abnormalities of gait and mobility (R26.89);Muscle weakness (generalized) (M62.81)    Time: 6948-5462 PT Time Calculation (min) (ACUTE ONLY): 11 min   Charges:   PT Evaluation $PT Re-evaluation: 1 Re-eval          Namya Voges, PT  Acute Rehab Dept ([facility]) [phone] Pager [phone]  03/19/2021   [facility] 03/19/2021, 12:22 PM

## 2021-03-20 ENCOUNTER — Inpatient Hospital Stay (HOSPITAL_COMMUNITY): Payer: Medicare (Managed Care)

## 2021-03-20 DIAGNOSIS — A419 Sepsis, unspecified organism: Secondary | ICD-10-CM | POA: Diagnosis not present

## 2021-03-20 DIAGNOSIS — J189 Pneumonia, unspecified organism: Secondary | ICD-10-CM | POA: Diagnosis not present

## 2021-03-20 DIAGNOSIS — R451 Restlessness and agitation: Secondary | ICD-10-CM

## 2021-03-20 DIAGNOSIS — N1832 Chronic kidney disease, stage 3b: Secondary | ICD-10-CM | POA: Diagnosis not present

## 2021-03-20 DIAGNOSIS — G9340 Encephalopathy, unspecified: Secondary | ICD-10-CM | POA: Diagnosis not present

## 2021-03-20 MED ORDER — HALOPERIDOL LACTATE 5 MG/ML IJ SOLN: 2.0000 mg | Freq: Once | INTRAMUSCULAR | Status: DC

## 2021-03-20 MED ORDER — SODIUM CHLORIDE 0.9 % IV SOLN
500.0000 mg | Freq: Every day | INTRAVENOUS | Status: DC
  Administered 2021-03-20: 500 mg via INTRAVENOUS
  Filled 2021-03-20 (×2): qty 500

## 2021-03-20 NOTE — Progress Notes (Signed)
PROGRESS NOTE    Jacob Rich  RKY:[id number] DOB: [birth year] DOA: 03/16/2021 PCP: Lucianne Lei, MD   Brief Narrative:  Patient is an 87 year old Caucasian male with a past medical history significant for but not limited to dementia and chronic renal insufficiency with a CKD stage IIIb, history of MRSA infection as well as other comorbidities who presented to the ED with lethargy.  At baseline he is usually confused but has been able to ambulate independently and perform many of his ADLs until last 2 to 3 months over which she had significant worsening in his cognition and his independence.  He then acutely worsened over the past 2 to 3 days with marked increased confusion and fatigue.  He was sent to the ED on 03/15/2021 and case manager met was engaged and arranging home health services.  He returned home with his wife but came back to the ED yesterday due to lethargy though he is not voicing any specific complaints.  He was noted to have episode of nausea with nonbloody vomiting in the ED and he was found to be febrile in the ED with tachycardia.  Chest x-ray showed a retrocardiac airspace opacity.  Chemistry panel was notable for an elevated creatinine of 1.6 and his lactic acid level was 4.7.  Blood and urine cultures were obtained and he was given 3.5 L of lactated Ringer's and started on antibiotics with IV ceftriaxone and azithromycin. Antibiotics were escalated to IV cefepime, IV Flagyl, IV vancomycin and procalcitonin continues to worsen but renal function is improving.  Patient's mentation is also improving today and he is more alert. Will changed diet to Soft diet and obtain SLP evaluation and they are recommending dysphagia 3 diet with thin liquids.  Assessment & Plan:   Principal Problem:   Sepsis ([place]) Active Problems:   Dementia with behavioral disturbance ([place])   Acute encephalopathy   Chronic kidney disease, stage 3b (HCC)  Severe sepsis likely secondary to pneumonia present on  admission -Presents with 2 days of increased confusion and lethargy, acutely worse yesterday, found to have temp 39.9 C, slightly tachycardic, and initial lactate 4.7 -Sepsis physiology is improving now as he has no further temps overnight  -UA yesterday not suggestive of infection; question of possible PNA on CXR though sat is close to 100% on rm air and respirations unlabored; abd exam benign; no meningismus; no apparent cellulitis  -PCT went from 0.53 -> 4.48 is further worsened and is now 10.60 -LA went from 4.7 -> 2.3 -Blood cultures collected in ED, 30 cc/kg LR was given, and empiric Rocephin and azithromycin started   -Continue to Trend lactate, -Checking COVID PCR and was negative -U/A showed Hazy Appearance with Small Hgb Trace Leukocytes, and Rare Bacteria but showed 0-5 RBCs per high-power field and 0-5 WBCs; Urine Cx showed multiple species Present so  -Check/Trend Procalcitonin -CXR today showed "Continued left lower lobe airspace opacity and right basilar opacity, not significantly changed. Findings concerning for pneumonia."  -Initially Broadened antibiotics but now will de-escalate and stop IV vancomycin if MRSA PCR is negative,.  We will stop IV Flagyl and de-escalate IV cefepime to IV ceftriaxone and add back azithromycin given his pneumonia -Continue to Follow cultures and clinical course   -IV fluid hydration now stopped -WBC is trending down and went from 8.0 is now 2.7 -Patient will need PT OT to further evaluate and treat once he is improved and they are recommending SNF;   Acute Encephalopathy, fluctuating; was improving yesterday however he  is much more confused today and encephalopathic and became very agitated -Pt's wife notes decline in mental status over the past 2-3 months but worsened acutely over the past 2-3 days  -No acute findings on head CT 03/15/21, suspect acute change related to fever/dehydration/possible sepsis  -L5 patient is now stopped.  Diet was  advanced to a soft diet but now is back on a dysphagia 3 diet after speech evaluation -C/w Sepsis workup and empiric antibiotics, expand workup if fails to improve with fluids and treatment of possible infection  -Mentation is improving but will continue with delirium precautions -Patient became extremely agitated and needed Haldol this afternoon -PT/OT Recommending SNF   Dementia  -Significant decline over the past 2-3 months, requiring increasing amounts of assistance with ADLs per pt's wife  -C/w Supportive care -Delirium precautions -Given Haldol this morning   CKD IIIb  Metabolic Acidosis -SCr is 1.76 on admission, up from 1.48 the day prior; now patient's BUNs/creatinine is 21/1.40 and not repeated even though it has been ordered given patient's Agitation  -He is being fluid-resuscitated in ED  -Mild metabolic acidosis with a CO2 of 18, anion gap of 11, chloride level 107 -Further IV fluid resuscitation has now been stopped but will resume maintenance IV fluid hydration with normal saline at 75 MLS per hour and will stop after 12 more hours -Avoid nephrotoxic medications if possible, contrast dyes, hypotension and renally adjust medications -Repeat CMP in the a.m.   Normocytic Anemia -Patient's Hgb/Hct went from 12.9/38.6 -> 15.4/45.7 and is further dropped down to 10.6/30.9 in the setting of dilution yesterday but has improved and is now 11.3/33.1 yesterday and repeat today is pending  -Checked anemia panel and showed an iron level of 11, U IBC 156, TIBC 167, saturation ratios of 7%, ferritin level 480, folate level 8.4, and vitamin B12 level of 270 -Will start p.o. Niferex 150 mg daily -Continue to monitor for signs and symptoms of bleeding; no overt bleeding noted -Repeat CBC in a.m.  Hyperbilirubinemia -Mild -The Patient's T bili went from 1.2  -> 1.5 -> 1.1 -> 1.3 on last check but not repeated given patient refusal  -Likely reactive and will need to continue monitor and  trend and repeat CMP in the a.m.  Thrombocytopenia -Likely in the setting of infection and antibiotic usage -The patient's platelet count has gone from 184 is now 106 yesterday and today is improved to 117 yesterday and repeat pending today -Continue monitor for signs and symptoms bleeding; currently no overt bleeding noted -Repeat CBC in a.m.  Hyponatremia -Patient's sodium is now back up to 136 yesterday and pending today  -We will continue IV fluids with sodium chloride at 75 mils per hour for another 12 more hours -Continue monitor and trend and repeat CMP in a.m.  Elevated AST -Mild -The patient's AST went from 36 -> 56 -> 47 -continue monitor and trend and repeat hepatic function panel in the a.m. and if necessary will obtain a right upper quadrant ultrasound and a acute hepatitis panel -repeat CMP in a.m.  Metabolic Acidosis -Mild -Patient CO2 is now 18, anion gap is 11, chloride level is 106 -Also start sodium bicarbonate supplementation was 650 mg p.o. twice daily -Continue monitor and trend and repeat CMP in a.m.  Hypophosphatemia -Patient's Phos level is 2.3 on last check -Continue to monitor and replete as necessary -Repeat phosphorus level in a.m.  DVT prophylaxis: Heparin injection 5000 units subcu every 8 hours Code Status: DO NOT RESUSCITATE Family  Communication: No family present at bedside but I spoke with the wife over the telephone Disposition Plan: Need further clinical improvement and evaluation by PT and OT; PT OT recommending SNF but he may not be rehabable  Status is: Inpatient  Remains inpatient appropriate because:Unsafe d/c plan, IV treatments appropriate due to intensity of illness or inability to take PO, and Inpatient level of care appropriate due to severity of illness  Dispo: The patient is from: Home              Anticipated d/c is to:  SNF              Patient currently is not medically stable to d/c.   Difficult to place patient  No  Consultants:  Palliative Care  Procedures: None  Antimicrobials:  Anti-infectives (From admission, onward)    Start     Dose/Rate Route Frequency Ordered Stop   03/20/21 1200  azithromycin (ZITHROMAX) 500 mg in sodium chloride 0.9 % 250 mL IVPB        500 mg 250 mL/hr over 60 Minutes Intravenous Daily 03/20/21 1027     03/19/21 2000  cefTRIAXone (ROCEPHIN) 2 g in sodium chloride 0.9 % 100 mL IVPB        2 g 200 mL/hr over 30 Minutes Intravenous Every 24 hours 03/19/21 1058     03/18/21 2000  vancomycin (VANCOREADY) IVPB 1250 mg/250 mL  Status:  Discontinued        1,250 mg 166.7 mL/hr over 90 Minutes Intravenous Every 36 hours 03/17/21 0607 03/18/21 1125   03/18/21 1300  vancomycin (VANCOCIN) IVPB 1000 mg/200 mL premix  Status:  Discontinued        1,000 mg 200 mL/hr over 60 Minutes Intravenous Every 24 hours 03/18/21 1125 03/19/21 1809   03/17/21 1800  ceFEPIme (MAXIPIME) 2 g in sodium chloride 0.9 % 100 mL IVPB  Status:  Discontinued        2 g 200 mL/hr over 30 Minutes Intravenous Every 12 hours 03/17/21 0550 03/19/21 1057   03/17/21 0430  ceFEPIme (MAXIPIME) 2 g in sodium chloride 0.9 % 100 mL IVPB        2 g 200 mL/hr over 30 Minutes Intravenous  Once 03/17/21 0418 03/17/21 0553   03/17/21 0430  metroNIDAZOLE (FLAGYL) IVPB 500 mg  Status:  Discontinued        500 mg 100 mL/hr over 60 Minutes Intravenous Every 8 hours 03/17/21 0418 03/19/21 1056   03/17/21 0430  vancomycin (VANCOCIN) IVPB 1000 mg/200 mL premix        1,000 mg 200 mL/hr over 60 Minutes Intravenous  Once 03/17/21 0418 03/17/21 0655   03/17/21 0230  cefTRIAXone (ROCEPHIN) 2 g in sodium chloride 0.9 % 100 mL IVPB        2 g 200 mL/hr over 30 Minutes Intravenous  Once 03/17/21 0225 03/17/21 0300   03/17/21 0230  azithromycin (ZITHROMAX) 500 mg in sodium chloride 0.9 % 250 mL IVPB        500 mg 250 mL/hr over 60 Minutes Intravenous  Once 03/17/21 0225 03/17/21 0400        Subjective: Seen and examined  at bedside a and he was more confused today and not as awake and alert as yesterday.  He is very agitated and swinging at the nurses.  Unable to provide a subjective history given his confusion this morning.  No other concerns or complaints at this time  Vitals:   03/18/21 1412 03/19/21 0331  03/19/21 0522 03/19/21 1357  BP: (!) 122/58  (!) 130/59 123/68  Pulse: 67  65 66  Resp: 14  20 17   Temp: 98.1 F (36.7 C)  98.8 F (37.1 C) 98 F (36.7 C)  TempSrc:   Oral Oral  SpO2: 95%  96% 98%  Weight:  67.1 kg    Height:        Intake/Output Summary (Last 24 hours) at 03/20/2021 1707 Last data filed at 03/20/2021 1500 Gross per 24 hour  Intake 183.17 ml  Output 450 ml  Net -266.83 ml    Filed Weights   03/17/21 0137 03/18/21 0500 03/19/21 0331  Weight: 75 kg 68.7 kg 67.1 kg   Examination: Physical Exam:  Constitutional: Thin elderly chronically ill-appearing Caucasian male currently in no acute distress appears more confused and unable to provide a subjective history given his current agitation  Eyes: Right eye is closed; sclerae anicteric  ENMT: External Ears, Nose appear normal. Grossly normal hearing.  Neck: Appears normal, supple, no cervical masses, normal ROM, no appreciable thyromegaly; no JVD Respiratory: Diminished to auscultation bilaterally, no wheezing, rales, rhonchi or crackles. Normal respiratory effort and patient is not tachypenic. No accessory muscle use.  Cardiovascular: RRR, no murmurs / rubs / gallops. S1 and S2 auscultated. No extremity edema. Abdomen: Soft, non-tender, non-distended. Bowel sounds positive.  GU: Deferred. Musculoskeletal: No clubbing / cyanosis of digits/nails. No joint deformity upper and lower extremities.  Skin: No rashes, lesions, ulcers on a limited skin evaluation. No induration; Warm and dry.  Neurologic: CN 2-12 grossly intact with no focal deficits.  Psychiatric: Impaired judgment and insight. Confused and agitated mood and not oriented.     Data Reviewed: I have personally reviewed following labs and imaging studies  CBC: Recent Labs  Lab 03/15/21 2230 03/17/21 0145 03/18/21 0639 03/19/21 0956  WBC 5.6 10.0 3.7* 2.7*  NEUTROABS 4.6 8.8* 2.9 2.0  HGB 12.9* 15.4 10.6* 11.3*  HCT 38.6* 45.7 30.9* 33.1*  MCV 95.8 94.8 94.5 93.2  PLT 205 184 106* 117*    Basic Metabolic Panel: Recent Labs  Lab 03/15/21 2230 03/17/21 0145 03/18/21 0639 03/19/21 0956  NA 136 136 134* 136  K 3.9 4.3 3.7 3.6  CL 104 100 106 107  CO2 24 22 19* 18*  GLUCOSE 111* 141* 83 81  BUN 18 20 23 21   CREATININE 1.48* 1.76* 1.40* 1.40*  CALCIUM 9.0 9.3 7.9* 8.0*  MG  --   --  1.9 1.9  PHOS  --   --  2.2* 2.3*    GFR: Estimated Creatinine Clearance: 34.6 mL/min (A) (by C-G formula based on SCr of 1.4 mg/dL (H)). Liver Function Tests: Recent Labs  Lab 03/15/21 2230 03/17/21 0145 03/18/21 0639 03/19/21 0956  AST 21 36 56* 47*  ALT 12 18 22 21   ALKPHOS 81 89 50 52  BILITOT 1.2 1.5* 1.1 1.3*  PROT 6.6 7.6 4.7* 5.2*  ALBUMIN 3.9 4.2 2.6* 2.7*    No results for input(s): LIPASE, AMYLASE in the last 168 hours. No results for input(s): AMMONIA in the last 168 hours. Coagulation Profile: Recent Labs  Lab 03/17/21 0145  INR 1.1    Cardiac Enzymes: No results for input(s): CKTOTAL, CKMB, CKMBINDEX, TROPONINI in the last 168 hours. BNP (last 3 results) No results for input(s): PROBNP in the last 8760 hours. HbA1C: No results for input(s): HGBA1C in the last 72 hours. CBG: No results for input(s): GLUCAP in the last 168 hours. Lipid Profile: No results  for input(s): CHOL, HDL, LDLCALC, TRIG, CHOLHDL, LDLDIRECT in the last 72 hours. Thyroid Function Tests: No results for input(s): TSH, T4TOTAL, FREET4, T3FREE, THYROIDAB in the last 72 hours. Anemia Panel: Recent Labs    03/18/21 0639  VITAMINB12 270  FOLATE 8.4  FERRITIN 480*  TIBC 167*  IRON 11*  RETICCTPCT 1.2    Sepsis Labs: Recent Labs  Lab 03/17/21 0137  03/17/21 0145 03/17/21 0405 03/17/21 0500 03/18/21 0639  PROCALCITON  --  0.53  --  4.48 10.60  LATICACIDVEN 4.7*  --  2.3*  --   --      Recent Results (from the past 240 hour(s))  Resp Panel by RT-PCR (Flu A&B, Covid) Nasopharyngeal Swab     Status: None   Collection Time: 03/15/21 10:03 PM   Specimen: Nasopharyngeal Swab; Nasopharyngeal(NP) swabs in vial transport medium  Result Value Ref Range Status   SARS Coronavirus 2 by RT PCR NEGATIVE NEGATIVE Final    Comment: (NOTE) SARS-CoV-2 target nucleic acids are NOT DETECTED.  The SARS-CoV-2 RNA is generally detectable in upper respiratory specimens during the acute phase of infection. The lowest concentration of SARS-CoV-2 viral copies this assay can detect is 138 copies/mL. A negative result does not preclude SARS-Cov-2 infection and should not be used as the sole basis for treatment or other patient management decisions. A negative result may occur with  improper specimen collection/handling, submission of specimen other than nasopharyngeal swab, presence of viral mutation(s) within the areas targeted by this assay, and inadequate number of viral copies(<138 copies/mL). A negative result must be combined with clinical observations, patient history, and epidemiological information. The expected result is Negative.  Fact Sheet for Patients:  EntrepreneurPulse.com.au  Fact Sheet for Healthcare Providers:  IncredibleEmployment.be  This test is no t yet approved or cleared by the [place] FDA and  has been authorized for detection and/or diagnosis of SARS-CoV-2 by FDA under an Emergency Use Authorization (EUA). This EUA will remain  in effect (meaning this test can be used) for the duration of the COVID-19 declaration under Section 564(b)(1) of the Act, 21 U.S.C.section 360bbb-3(b)(1), unless the authorization is terminated  or revoked sooner.       Influenza A by PCR NEGATIVE  NEGATIVE Final   Influenza B by PCR NEGATIVE NEGATIVE Final    Comment: (NOTE) The Xpert Xpress SARS-CoV-2/FLU/RSV plus assay is intended as an aid in the diagnosis of influenza from Nasopharyngeal swab specimens and should not be used as a sole basis for treatment. Nasal washings and aspirates are unacceptable for Xpert Xpress SARS-CoV-2/FLU/RSV testing.  Fact Sheet for Patients: EntrepreneurPulse.com.au  Fact Sheet for Healthcare Providers: IncredibleEmployment.be  This test is not yet approved or cleared by the [place] FDA and has been authorized for detection and/or diagnosis of SARS-CoV-2 by FDA under an Emergency Use Authorization (EUA). This EUA will remain in effect (meaning this test can be used) for the duration of the COVID-19 declaration under Section 564(b)(1) of the Act, 21 U.S.C. section 360bbb-3(b)(1), unless the authorization is terminated or revoked.  Performed at [facility], [place] [address] [place], [place] [address]   Urine Culture     Status: Abnormal   Collection Time: 03/17/21  1:30 AM   Specimen: In/Out Cath Urine  Result Value Ref Range Status   Specimen Description   Final    IN/OUT CATH URINE Performed at [facility] [address] [place], [place] [address]    Special Requests   Final  NONE Performed at [facility], [place] [address] [place], [place] [address]    Culture MULTIPLE SPECIES PRESENT, SUGGEST RECOLLECTION (A)  Final   Report Status 03/18/2021 FINAL  Final  Culture, blood (Routine x 2)     Status: None (Preliminary result)   Collection Time: 03/17/21  1:45 AM   Specimen: BLOOD  Result Value Ref Range Status   Specimen Description   Final    BLOOD RIGHT ANTECUBITAL Performed at [place] [address] [place], [place] [address]    Special Requests   Final    BOTTLES DRAWN AEROBIC AND ANAEROBIC Blood  Culture adequate volume Performed at [facility] [address], [place], [place] [address]    Culture   Final    NO GROWTH 3 DAYS Performed at [facility], [place] [address] [place], [place] [address]    Report Status PENDING  Incomplete  SARS CORONAVIRUS 2 (TAT 6-24 HRS) Nasopharyngeal Nasopharyngeal Swab     Status: None   Collection Time: 03/17/21  2:30 AM   Specimen: Nasopharyngeal Swab  Result Value Ref Range Status   SARS Coronavirus 2 NEGATIVE NEGATIVE Final    Comment: (NOTE) SARS-CoV-2 target nucleic acids are NOT DETECTED.  The SARS-CoV-2 RNA is generally detectable in upper and lower respiratory specimens during the acute phase of infection. Negative results do not preclude SARS-CoV-2 infection, do not rule out co-infections with other pathogens, and should not be used as the sole basis for treatment or other patient management decisions. Negative results must be combined with clinical observations, patient history, and epidemiological information. The expected result is Negative.  Fact Sheet for Patients: SugarRoll.be  Fact Sheet for Healthcare Providers: https://www.woods-mathews.com/  This test is not yet approved or cleared by the [place] FDA and  has been authorized for detection and/or diagnosis of SARS-CoV-2 by FDA under an Emergency Use Authorization (EUA). This EUA will remain  in effect (meaning this test can be used) for the duration of the COVID-19 declaration under Se ction 564(b)(1) of the Act, 21 U.S.C. section 360bbb-3(b)(1), unless the authorization is terminated or revoked sooner.  Performed at [facility], [place] [address], [place], [place] [address]   Culture, blood (Routine x 2)     Status: None (Preliminary result)   Collection Time: 03/17/21  4:05 AM   Specimen: BLOOD  Result Value Ref Range Status   Specimen Description   Final    BLOOD BLOOD LEFT  FOREARM Performed at [facility] [address], [place], [place] [address]    Special Requests   Final    BOTTLES DRAWN AEROBIC AND ANAEROBIC Blood Culture adequate volume Performed at [place] [address], [place], [place] [address]    Culture   Final    NO GROWTH 3 DAYS Performed at [facility], [place] [address] [place], [place] [address]    Report Status PENDING  Incomplete  MRSA Next Gen by PCR, Nasal     Status: None   Collection Time: 03/19/21  2:16 PM   Specimen: Nasal Mucosa; Nasal Swab  Result Value Ref Range Status   MRSA by PCR Next Gen NOT DETECTED NOT DETECTED Final    Comment: (NOTE) The GeneXpert MRSA Assay (FDA approved for NASAL specimens only), is one component of a comprehensive MRSA colonization surveillance program. It is not intended to diagnose MRSA infection nor to guide or monitor treatment for MRSA infections.  Test performance is not FDA approved in patients less than 73 years old. Performed at [facility], [place] [address], [place], [place] [address]      RN Pressure Injury Documentation:     Estimated body mass index is 18.01 kg/m as calculated from the following:   Height as of this encounter: 6' 4"  (1.93 m).   Weight as of this encounter: 67.1 kg.  Malnutrition Type: Nutrition Problem: Increased nutrient needs Etiology: acute illness Malnutrition Characteristics: Signs/Symptoms: estimated needs Nutrition Interventions: Interventions: Ensure Enlive (each supplement provides 350kcal and 20 grams of protein), MVI, Magic cup   Radiology Studies: DG CHEST PORT 1 VIEW  Result Date: 03/20/2021 CLINICAL DATA:  Shortness of breath. EXAM: PORTABLE CHEST 1 VIEW COMPARISON:  03/19/2021. FINDINGS: Stable cardiomegaly. Low lung volumes. Persistent bibasilar infiltrates/edema without interim change. No pleural effusion or pneumothorax. IMPRESSION: 1.  Stable cardiomegaly. 2. Low lung  volumes. Persistent bibasilar infiltrates/edema thought interim change. Electronically Signed   By: Marcello Moores  Register   On: 03/20/2021 07:16   DG CHEST PORT 1 VIEW  Result Date: 03/19/2021 CLINICAL DATA:  Shortness of breath EXAM: PORTABLE CHEST 1 VIEW COMPARISON:  03/18/2021 FINDINGS: The heart is normal size. Mediastinal contours within normal limits. Left lower lobe airspace opacity and right basilar opacity again noted, unchanged. No effusions. No acute bony abnormality. IMPRESSION: Continued left lower lobe airspace opacity and right basilar opacity, not significantly changed. Findings concerning for pneumonia. Electronically Signed   By: Rolm Baptise M.D.   On: 03/19/2021 08:10    Scheduled Meds:  haloperidol lactate  2 mg Intramuscular Once   heparin  5,000 Units Subcutaneous Q8H   iron polysaccharides  150 mg Oral Daily   phosphorus  500 mg Oral BID   pneumococcal 23 valent vaccine  0.5 mL Intramuscular Tomorrow-1000   sodium bicarbonate  650 mg Oral BID   Continuous Infusions:  sodium chloride Stopped (03/19/21 2000)   azithromycin 500 mg (03/20/21 1416)   cefTRIAXone (ROCEPHIN)  IV      LOS: 3 days   Kerney Elbe, DO [facility] PAGER is on AMION  If 7PM-7AM, please contact night-coverage [email or web address]

## 2021-03-20 NOTE — Evaluation (Signed)
Clinical/Bedside Swallow Evaluation Patient Details  Name: Jacob Rich MRN: [id number] Date of Birth: 07-09-1931  Today's Date: 03/20/2021 Time: SLP Start Time (ACUTE ONLY): 1530 SLP Stop Time (ACUTE ONLY): 1545 SLP Time Calculation (min) (ACUTE ONLY): 15 min  Past Medical History:  Past Medical History:  Diagnosis Date   Dementia (HCC)    MRSA infection    Past Surgical History:  Past Surgical History:  Procedure Laterality Date   MRSa     2006/2007   OTHER SURGICAL HISTORY     Dicverticulitis   HPI:  87 year old Caucasian male  who presented to the ED with lethargy admitted with sepsis d/t pna.  PMH: dementia and chronic renal insufficiency with a CKD stage IIIb, history of MRSA infection as well as other comorbidities.   At baseline he is usually confused but has been able to ambulate independently and perform many of his ADLs until last 2 to 3 months over which she had significant worsening in his cognition and his independence.   Assessment / Plan / Recommendation Clinical Impression  Pt presents with mild oral and suspected component of pharyngeal and possible esophageal dysphagia. Per nursing, pt with very limited PO consumption despite encouragement from staff, likely in setting of advanced dementia. Pt consumed small sips of thin liquids via cup and straw with post swallow belching. No overt coughing exhibited. Nectar thick liquids, tsp of puree, and single solid snack were without overt s/sx of aspiration. Pt with disorganized chewing of solid and reduced bolus cohesion. Pt appears very deconditioned overall, with weaker laryngeal elevation per palpation. Recommend continue mechanical soft and thin liquids with meds crushed and full supervision with all PO. SLP to continue to follow.  SLP Visit Diagnosis: Dysphagia, unspecified (R13.10);Dysphagia, oral phase (R13.11)    Aspiration Risk  Moderate aspiration risk    Diet Recommendation   Dysphagia 3 (mechanical soft) thin  liquids   Medication Administration: Crushed with puree    Other  Recommendations Oral Care Recommendations: Oral care BID   Follow up Recommendations 24 hour supervision/assistance;Skilled Nursing facility      Frequency and Duration min 1 x/week  1 week       Prognosis Prognosis for Safe Diet Advancement: Fair Barriers to Reach Goals: Severity of deficits;Cognitive deficits      Swallow Study   General Date of Onset: 03/17/21 HPI: 87 year old Caucasian male  who presented to the ED with lethargy admitted with sepsis d/t pna.  PMH: dementia and chronic renal insufficiency with a CKD stage IIIb, history of MRSA infection as well as other comorbidities.   At baseline he is usually confused but has been able to ambulate independently and perform many of his ADLs until last 2 to 3 months over which she had significant worsening in his cognition and his independence. Type of Study: Bedside Swallow Evaluation Previous Swallow Assessment: none on file Diet Prior to this Study: Dysphagia 3 (soft);Thin liquids Temperature Spikes Noted: No Respiratory Status: Room air History of Recent Intubation: No Behavior/Cognition: Alert;Requires cueing;Confused;Cooperative Oral Cavity Assessment: Dry Oral Cavity - Dentition: Adequate natural dentition Vision: Functional for self-feeding Self-Feeding Abilities: Needs assist Patient Positioning: Upright in bed Baseline Vocal Quality: Low vocal intensity Volitional Cough: Cognitively unable to elicit Volitional Swallow: Unable to elicit    Oral/Motor/Sensory Function Overall Oral Motor/Sensory Function: Generalized oral weakness   Ice Chips Ice chips: Not tested   Thin Liquid Thin Liquid: Impaired Presentation: Cup;Straw Oral Phase Impairments: Reduced lingual movement/coordination Oral Phase Functional Implications: Prolonged oral transit Pharyngeal  Phase Impairments: Suspected delayed Swallow;Multiple swallows;Throat Clearing - Delayed;Other  (comments) (post swallow belching)    Nectar Thick Nectar Thick Liquid: Impaired Presentation: Straw Pharyngeal Phase Impairments: Suspected delayed Swallow;Multiple swallows   Honey Thick Honey Thick Liquid: Not tested   Puree Puree: Impaired Presentation: Spoon Oral Phase Impairments: Reduced lingual movement/coordination Pharyngeal Phase Impairments: Suspected delayed Swallow;Multiple swallows;Decreased hyoid-laryngeal movement   Solid     Solid: Impaired Presentation: Spoon Oral Phase Impairments: Reduced lingual movement/coordination;Impaired mastication Oral Phase Functional Implications: Prolonged oral transit;Impaired mastication Pharyngeal Phase Impairments: Suspected delayed Swallow;Multiple swallows      Ardyth Gal MA, CCC-SLP Acute Rehabilitation Services   03/20/2021,4:06 PM

## 2021-03-20 NOTE — Progress Notes (Signed)
This RN & NT approached pt asking him to let up check his BP explaining what we needed and he got feisty stating he was going to slap [place] and for [place] to get the hell out. No further attempts made to convince pt to let us assess him.

## 2021-03-21 DIAGNOSIS — R531 Weakness: Secondary | ICD-10-CM

## 2021-03-21 DIAGNOSIS — G309 Alzheimer's disease, unspecified: Secondary | ICD-10-CM | POA: Diagnosis not present

## 2021-03-21 DIAGNOSIS — Z7189 Other specified counseling: Secondary | ICD-10-CM

## 2021-03-21 DIAGNOSIS — R6520 Severe sepsis without septic shock: Secondary | ICD-10-CM | POA: Diagnosis not present

## 2021-03-21 DIAGNOSIS — G9340 Encephalopathy, unspecified: Secondary | ICD-10-CM | POA: Diagnosis not present

## 2021-03-21 DIAGNOSIS — A419 Sepsis, unspecified organism: Secondary | ICD-10-CM | POA: Diagnosis not present

## 2021-03-21 DIAGNOSIS — Z515 Encounter for palliative care: Secondary | ICD-10-CM | POA: Diagnosis not present

## 2021-03-21 MED ORDER — AMOXICILLIN-POT CLAVULANATE 875-125 MG PO TABS
1.0000 | ORAL_TABLET | Freq: Two times a day (BID) | ORAL | Status: DC
  Administered 2021-03-21 – 2021-03-22 (×2): 1 via ORAL
  Filled 2021-03-21 (×3): qty 1

## 2021-03-21 NOTE — Progress Notes (Signed)
Patient noted to be a lot less combative today. Patient did eat 100% of his breakfast, as well as take his medications. Patient is still only alert to himself at this time.

## 2021-03-21 NOTE — Consult Note (Signed)
Consultation Note Date: 03/21/2021   Patient Name: Jacob Rich  DOB: 09/19/1930  MRN: [id number]  Age / Sex: 87 y.o., male  PCP: Renaye Rakers, MD Referring Physician: Dorcas Carrow, MD  Reason for Consultation: Establishing goals of care  HPI/Patient Profile: 87 y.o. male admitted on 03/16/2021    Clinical Assessment and Goals of Care: 87 year old gentleman originally from [place], moved down to [place] 5 years ago.  Has been diagnosed with dementia for at least the past 15 years as per his wife.  Patient has chronic renal insufficiency with stage III chronic kidney disease, history of MRSA infection.  Patient presented to the hospital with significant worsening in his cognition and in his independence.  Patient acutely worsened with marked increasing confusion and fatigue.  Patient was found to be febrile and with possible pneumonia.  Admitted to hospital medicine service and antibiotics were initiated.  Hospital course complicated by ongoing confusion and lethargy.  Mental status waxes and wanes.  Oral intake is overall poor.  Palliative consultation for broad goals of care discussion as well as disposition options discussion has been recommended.  Patient is a frail elderly appearing gentleman resting in bed.  He opens his eyes and attempts to interact some however does not know that he is in the hospital, not oriented to time either.  He is able to recognize his family present in the room.  Patient's son has arrived from [place] and patient's daughter who lives locally in [place] both present at the bedside.  Subsequently, patient's wife also arrived at the bedside.  A family meeting was held initially inside the patient's room and then in much more detail outside the patient's room as well.  I introduced myself and palliative care as follows:  Palliative medicine is specialized medical care for people  living with serious illness. It focuses on providing relief from the symptoms and stress of a serious illness. The goal is to improve quality of life for both the patient and the family. Goals of care: Broad aims of medical therapy in relation to the patient's values and preferences. Our aim is to provide medical care aimed at enabling patients to achieve the goals that matter most to them, given the circumstances of their particular medical situation and their constraints.   Life review performed.  Goals wishes and values attempted to be explored.  Differences between a hospice philosophy of care as well as a palliative approach were explored.  Patient's family is familiar with dementia decline trajectory in a previous close family member.  Patient's family states that he has been bedbound essentially since the last month and has had significant functional decline since the last month.  He also has had some falls.  He has not been oriented much.  He has had episodes of confusion combativeness and outright agitation.  His oral intake has diminished.  See below.   NEXT OF KIN Lives at home with wife.  Has son and her daughter.  SUMMARY OF RECOMMENDATIONS   Agree with DNR  Continue current mode of care Discussed about goals of care and best possible disposition options: Reviewed differences between skilled nursing facility, rehabilitation, long-term care, residential hospice as well as home with hospice in detail, to the best of my ability.  Patient's family elects for home with hospice services at this time.  I have discussed with TRH MD as well as with [facility] colleague Thereasa Distance.  Code Status/Advance Care Planning: DNR   Symptom Management:     Palliative Prophylaxis:  Delirium Protocol     Psycho-social/Spiritual:  Desire for further Chaplaincy support:yes Additional Recommendations: Education on Hospice  Prognosis:  < 6 months  Discharge Planning: Home with Hospice      Primary  Diagnoses: Present on Admission:  Sepsis (HCC)  Dementia with behavioral disturbance (HCC)  Acute encephalopathy  Chronic kidney disease, stage 3b (HCC)   I have reviewed the medical record, interviewed the patient and family, and examined the patient. The following aspects are pertinent.  Past Medical History:  Diagnosis Date   Dementia (HCC)    MRSA infection    Social History   Socioeconomic History   Marital status: Married    Spouse name: [name]   Number of children: 4   Years of education: 12   Highest education level: Not on file  Occupational History   Occupation: Retired  Tobacco Use   Smoking status: Never   Smokeless tobacco: Never  Substance and Sexual Activity   Alcohol use: Not Currently    Comment: rare   Drug use: No   Sexual activity: Not on file  Other Topics Concern   Not on file  Social History Narrative   Lives with wife and daughter   Caffeine use: Drinks 2-3 glasses tea per day   Social Determinants of Health   Financial Resource Strain: Not on file  Food Insecurity: Not on file  Transportation Needs: Not on file  Physical Activity: Not on file  Stress: Not on file  Social Connections: Not on file   Family History  Problem Relation Age of Onset   Glaucoma Mother    Scheduled Meds:  amoxicillin-clavulanate  1 tablet Oral Q12H   haloperidol lactate  2 mg Intramuscular Once   heparin  5,000 Units Subcutaneous Q8H   iron polysaccharides  150 mg Oral Daily   pneumococcal 23 valent vaccine  0.5 mL Intramuscular Tomorrow-1000   sodium bicarbonate  650 mg Oral BID   Continuous Infusions: PRN Meds:.acetaminophen **OR** acetaminophen, ondansetron **OR** ondansetron (ZOFRAN) IV, senna-docusate Medications Prior to Admission:  Prior to Admission medications   Medication Sig Start Date End Date Taking? Authorizing Provider  diphenhydrAMINE (BENADRYL) 25 MG tablet Take 6.25 mg by mouth every 6 (six) hours as needed.   Yes [provider]  donepezil (ARICEPT) 10 MG tablet Take 1 tablet (10 mg total) by mouth at bedtime. Patient taking differently: Take 10 mg by mouth daily. 04/16/16  Yes Anson Fret, MD  pantoprazole (PROTONIX) 40 MG tablet Take 1 tablet (40 mg total) by mouth daily. 06/14/20 03/17/21 Yes Arrien, York Ram, MD  triamcinolone ointment (KENALOG) 0.1 % Apply 1 application topically 2 (two) times daily as needed. Back itching 02/27/21  Yes [provider]   No Known Allergies Review of Systems Confused  Physical Exam Thin chronically ill-appearing gentleman resting in bed Patient not oriented to place or time.  Does recognize family present in the room. Diminished breath sounds bilaterally S1-S2 Abdomen is not distended  Vital Signs: BP 138/64  Pulse 72   Temp 98.8 F (37.1 C) (Oral)   Resp 20   Ht 6\' 4"  (1.93 m)   Wt 66.7 kg   SpO2 97%   BMI 17.90 kg/m  Pain Scale: 0-10   Pain Score: 0-No pain   SpO2: SpO2: 97 % O2 Device:SpO2: 97 % O2 Flow Rate: .   IO: Intake/output summary:  Intake/Output Summary (Last 24 hours) at 03/21/2021 1521 Last data filed at 03/21/2021 0300 Gross per 24 hour  Intake 0 ml  Output 350 ml  Net -350 ml    LBM: Last BM Date: 03/19/21 Baseline Weight: Weight: 75 kg Most recent weight: Weight: 66.7 kg     Palliative Assessment/Data:   PPS 30%  Time In:  1430 Time Out:  1530 Time Total:  60 min.  Greater than 50%  of this time was spent counseling and coordinating care related to the above assessment and plan.  Signed by: 05/19/21, MD   Please contact Palliative Medicine Team phone at [phone] for questions and concerns.  For individual provider: See [phone]

## 2021-03-21 NOTE — Progress Notes (Addendum)
Civil engineer, contracting [facility]) Hospital Liaison: RN note     Addendum: Received return call from pt's wife, [name].  Reviewed hospice at home services; [name] verbalizes understanding and agreement.  Reports the following DME in the home:  wheelchair, walker, and BSC.  Reports needing the following: hospital bed, over bed table.  This will be ordered for STAT delivery tomorrow.  Notified by Transition of Care Manger of patient/family request for [facility] services at home after discharge. Chart and patient information under review by [facility] physician. Hospice eligibility pending currently.     Writer spoke called pt's wife [name] to initiate education related to hospice philosophy, services and team approach to care, but was unable to reach her.  Voicemail left with contact info.   Unable to assess for DME needs at this time.  Please send signed and completed DNR form home with patient/family.   Please provide prescriptions for mediations at discharge to ensure comfort until patient can be admitted onto hospice services.   [facility] aware of the above. Please notify [facility] when patient is ready to leave the unit at discharge. (Call [phone] or [phone] after 5pm.)  [facility] information and contact numbers given to  [facility] in a voicemail.       Please do not hesitate to call with questions.   Thank you for the opportunity to participate in this patient's care.  Gillian Scarce, BSN, RN       [facility] Liaison (listed on [facility] under Hospice /[phone]  [phone] (24h on call)

## 2021-03-21 NOTE — TOC Initial Note (Addendum)
Transition of Care [facility]) - Initial/Assessment Note    Patient Details  Name: Jacob Rich MRN: [id number] Date of Birth: 1931-05-06  [facility]) CM/SW Contact:    Ida Rogue, LCSW Phone Number: 03/21/2021, 9:17 AM  Clinical Narrative:   Patient seen in follow up to PT/OT recommendation of SNF.  Mr Helzer is oriented to self only, minimal oral intake.  Is not rehabable currently.  Spoke wot wife and explained this to her.  She states she is unable to care for him at home, and has no family support nor otherwise to help her.  She knows he is not eating, talks about his quickly advancing dementia and the possibility of hospice care.  I explained that even if we go that route, he is not imminently eligible for residential hospice.  I told her she needed to apply for [employer] special assistance today. TOC will continue to follow during the course of hospitalization.  Addendum:  Followed up with wife, son, daughter at bedside following meeting with Dr Linna Darner.  Wife confirmed that she plans to take patient home with hospice care.  Daughter is able to help once a week on her day off of work.  Son lives in [place] and is returning tomorrow.  Contacted Chrislyn King with [facility] to make referral.               Expected Discharge Plan: Home/Self Care     Patient Goals and CMS Choice     Choice offered to / list presented to : Spouse  Expected Discharge Plan and Services Expected Discharge Plan: Home/Self Care   Discharge Planning Services: CM Consult   Living arrangements for the past 2 months: Single Family Home                                      Prior Living Arrangements/Services Living arrangements for the past 2 months: Single Family Home Lives with:: Spouse Patient language and need for interpreter reviewed:: Yes        Need for Family Participation in Patient Care: Yes (Comment) Care giver support system in place?: Yes (comment)   Criminal Activity/Legal  Involvement Pertinent to Current Situation/Hospitalization: No - Comment as needed  Activities of Daily Living Home Assistive Devices/Equipment: None ADL Screening (condition at time of admission) Patient's cognitive ability adequate to safely complete daily activities?: No Is the patient deaf or have difficulty hearing?: No Does the patient have difficulty seeing, even when wearing glasses/contacts?: No Does the patient have difficulty concentrating, remembering, or making decisions?: Yes Patient able to express need for assistance with ADLs?: No Does the patient have difficulty dressing or bathing?: Yes Independently performs ADLs?: No Communication: Needs assistance Is this a change from baseline?: Change from baseline, expected to last <3 days Dressing (OT): Needs assistance Is this a change from baseline?: Change from baseline, expected to last <3days Grooming: Needs assistance Is this a change from baseline?: Change from baseline, expected to last <3 days Feeding: Needs assistance Is this a change from baseline?: Change from baseline, expected to last <3 days Bathing: Needs assistance Is this a change from baseline?: Change from baseline, expected to last <3 days Toileting: Needs assistance Is this a change from baseline?: Change from baseline, expected to last <3 days In/Out Bed: Needs assistance Is this a change from baseline?: Change from baseline, expected to last <3 days Walks in Home: Needs  assistance Is this a change from baseline?: Change from baseline, expected to last <3 days Does the patient have difficulty walking or climbing stairs?: Yes Weakness of Legs: Both Weakness of Arms/Hands: None  Permission Sought/Granted Permission sought to share information with : Family Supports Permission granted to share information with : No  Share Information with NAME: Donahue,[name] (Spouse)   [phone]           Emotional Assessment Appearance:: Appears stated  age Attitude/Demeanor/Rapport: Unable to Assess Affect (typically observed): Unable to Assess Orientation: : Oriented to Self Alcohol / Substance Use: Not Applicable Psych Involvement: No (comment)  Admission diagnosis:  Renal insufficiency [N28.9] Sepsis (HCC) [A41.9] Community acquired pneumonia of left lower lobe of lung [J18.9] Patient Active Problem List   Diagnosis Date Noted   Sepsis (HCC) 03/17/2021   Chronic kidney disease, stage 3b (HCC) 03/17/2021   Encephalopathy 06/29/2020   Convulsion (HCC) 06/12/2020   Intractable nausea and vomiting 06/12/2020   Acute encephalopathy 06/11/2020   Nausea & vomiting 06/11/2020   Complicated UTI (urinary tract infection) 01/05/2020   Dementia (HCC) 01/05/2020   UTI (urinary tract infection) 06/17/2017   Traumatic closed displaced fracture of shoulder with anterior dislocation with routine healing 06/10/2016   Dementia with behavioral disturbance (HCC) 04/16/2016   PCP:  Renaye Rakers, MD Pharmacy:   [facility] [phone] [place], [place] - [address] AT [facility] [address] [place] [address] Phone: [phone] Fax: [phone]     Social Determinants of Health (SDOH) Interventions    Readmission Risk Interventions No flowsheet data found.

## 2021-03-21 NOTE — Progress Notes (Signed)
PROGRESS NOTE    Clayburn Weekly  VEL:[id number] DOB: 1931/08/04 DOA: 03/16/2021 PCP: Renaye Rakers, MD    Brief Narrative:  87 year old gentleman with history of advanced dementia, chronic kidney disease stage IIIb, history of MRSA infection as well as other multiple comorbidities brought to ER with lethargic and more confused than baseline.  Apparently, last few months his dementia has been worsening.  In the emergency room febrile and tachycardic.  Lactic acid 4.7.  Urine and blood cultures were sent and patient is started on IV antibiotics and fluid resuscitation.  Chest x-ray consistent with retrocardiac airspace opacity. Remains in the hospital with poor participation.   Assessment & Plan:   Principal Problem:   Sepsis (HCC) Active Problems:   Dementia with behavioral disturbance (HCC)   Acute encephalopathy   Chronic kidney disease, stage 3b (HCC)  Severe sepsis likely secondary to pneumonia present on admission, suspect aspiration pneumonia. Sepsis present with temperature 39.9, tachycardic and initial lactic acid of 4.7.  Improved. Treated with broad-spectrum antibiotics, repeat chest x-ray with left lower lobe opacity and right basilar opacity.  Difficulties with maintaining IV lines, will change to oral Augmentin for 1 week. Previously seen by speech therapy and remains on dysphagia 3 diet.  Acute encephalopathy, confusion in the context of underlying advanced dementia: Fall and delirium precautions. Patient has progressive debility and advancing dementia. Goal of care discussion held by palliative care team, decided to go home with home hospice.Currently appropriate for hospice at home with family support and in the future may need residential hospice.Manage agitation with oral medication including Haldol.  Electrolyte abnormalities including hypophosphatemia: Replaced.  CKD stage IIIb: At about baseline.  Monitor.    DVT prophylaxis: heparin injection 5,000 Units Start:  03/17/21 0600   Code Status: DNR Family Communication: Family with palliative care team Disposition Plan: Status is: Inpatient  Remains inpatient appropriate because:Inpatient level of care appropriate due to severity of illness  Dispo: The patient is from: Home              Anticipated d/c is to:  Home with home hospice              Patient currently is not medically stable to d/c.   Difficult to place patient No         Consultants:  Palliative care team  Procedures:  None  Antimicrobials:  Antibiotics Given (last 72 hours)     Date/Time Action Medication Dose Rate   03/18/21 1745 New Bag/Given   ceFEPIme (MAXIPIME) 2 g in sodium chloride 0.9 % 100 mL IVPB 2 g 200 mL/hr   03/18/21 2101 New Bag/Given   metroNIDAZOLE (FLAGYL) IVPB 500 mg 500 mg 100 mL/hr   03/19/21 0331 New Bag/Given   metroNIDAZOLE (FLAGYL) IVPB 500 mg 500 mg 100 mL/hr   03/19/21 0536 New Bag/Given   ceFEPIme (MAXIPIME) 2 g in sodium chloride 0.9 % 100 mL IVPB 2 g 200 mL/hr   03/20/21 1416 New Bag/Given   azithromycin (ZITHROMAX) 500 mg in sodium chloride 0.9 % 250 mL IVPB 500 mg 250 mL/hr   03/20/21 2040 New Bag/Given   cefTRIAXone (ROCEPHIN) 2 g in sodium chloride 0.9 % 100 mL IVPB 2 g 200 mL/hr          Subjective: Patient seen and examined.  Pleasantly confused.  In the afternoon, he was pleasant and conversant but totally confused.  Nursing reported to me that he was presented to an did not let IV to start.  Will change  to oral medications.  Objective: Vitals:   03/20/21 2001 03/21/21 0349 03/21/21 0436 03/21/21 1401  BP: 129/61 132/61  138/64  Pulse: 70 63  72  Resp: (!) Temp: 98.9 F (37.2 C) 98.5 F (36.9 C)  98.8 F (37.1 C)  TempSrc: Oral Oral  Oral  SpO2: 95% 97%    Weight:   66.7 kg   Height:        Intake/Output Summary (Last 24 hours) at 03/21/2021 1607 Last data filed at 03/21/2021 0300 Gross per 24 hour  Intake 0 ml  Output 350 ml  Net -350 ml   Filed  Weights   03/18/21 0500 03/19/21 0331 03/21/21 0436  Weight: 68.7 kg 67.1 kg 66.7 kg    Examination:  General: Frail and debilitated.  Pleasant but confused.  Intermittently agitated.  On room air.  Bilateral soft mittens present. Cardiovascular: S1-S2 normal.  Regular rate rhythm. Respiratory: No added sounds.  On room air. Gastrointestinal: Soft and nontender.  Bowel sounds present. Ext: No swelling or edema.  No cyanosis. Neuro: No focal deficits.     Data Reviewed: I have personally reviewed following labs and imaging studies  CBC: Recent Labs  Lab 03/15/21 2230 03/17/21 0145 03/18/21 0639 03/19/21 0956  WBC 5.6 10.0 3.7* 2.7*  NEUTROABS 4.6 8.8* 2.9 2.0  HGB 12.9* 15.4 10.6* 11.3*  HCT 38.6* 45.7 30.9* 33.1*  MCV 95.8 94.8 94.5 93.2  PLT 205 184 106* 117*   Basic Metabolic Panel: Recent Labs  Lab 03/15/21 2230 03/17/21 0145 03/18/21 0639 03/19/21 0956  NA 136 136 134* 136  K 3.9 4.3 3.7 3.6  CL 104 100 106 107  CO2 24 22 19* 18*  GLUCOSE 111* 141* 83 81  BUN CREATININE 1.48* 1.76* 1.40* 1.40*  CALCIUM 9.0 9.3 7.9* 8.0*  MG  --   --  1.9 1.9  PHOS  --   --  2.2* 2.3*   GFR: Estimated Creatinine Clearance: 34.4 mL/min (A) (by C-G formula based on SCr of 1.4 mg/dL (H)). Liver Function Tests: Recent Labs  Lab 03/15/21 2230 03/17/21 0145 03/18/21 0639 03/19/21 0956  AST 21 36 56* 47*  ALT ALKPHOS 81 89 50 52  BILITOT 1.2 1.5* 1.1 1.3*  PROT 6.6 7.6 4.7* 5.2*  ALBUMIN 3.9 4.2 2.6* 2.7*   No results for input(s): LIPASE, AMYLASE in the last 168 hours. No results for input(s): AMMONIA in the last 168 hours. Coagulation Profile: Recent Labs  Lab 03/17/21 0145  INR 1.1   Cardiac Enzymes: No results for input(s): CKTOTAL, CKMB, CKMBINDEX, TROPONINI in the last 168 hours. BNP (last 3 results) No results for input(s): PROBNP in the last 8760 hours. HbA1C: No results for input(s): HGBA1C in the last 72 hours. CBG: No  results for input(s): GLUCAP in the last 168 hours. Lipid Profile: No results for input(s): CHOL, HDL, LDLCALC, TRIG, CHOLHDL, LDLDIRECT in the last 72 hours. Thyroid Function Tests: No results for input(s): TSH, T4TOTAL, FREET4, T3FREE, THYROIDAB in the last 72 hours. Anemia Panel: No results for input(s): VITAMINB12, FOLATE, FERRITIN, TIBC, IRON, RETICCTPCT in the last 72 hours. Sepsis Labs: Recent Labs  Lab 03/17/21 0137 03/17/21 0145 03/17/21 0405 03/17/21 0500 03/18/21 0639  PROCALCITON  --  0.53  --  4.48 10.60  LATICACIDVEN 4.7*  --  2.3*  --   --     Recent Results (from the past 240 hour(s))  Resp  Panel by RT-PCR (Flu A&B, Covid) Nasopharyngeal Swab     Status: None   Collection Time: 03/15/21 10:03 PM   Specimen: Nasopharyngeal Swab; Nasopharyngeal(NP) swabs in vial transport medium  Result Value Ref Range Status   SARS Coronavirus 2 by RT PCR NEGATIVE NEGATIVE Final    Comment: (NOTE) SARS-CoV-2 target nucleic acids are NOT DETECTED.  The SARS-CoV-2 RNA is generally detectable in upper respiratory specimens during the acute phase of infection. The lowest concentration of SARS-CoV-2 viral copies this assay can detect is 138 copies/mL. A negative result does not preclude SARS-Cov-2 infection and should not be used as the sole basis for treatment or other patient management decisions. A negative result may occur with  improper specimen collection/handling, submission of specimen other than nasopharyngeal swab, presence of viral mutation(s) within the areas targeted by this assay, and inadequate number of viral copies(<138 copies/mL). A negative result must be combined with clinical observations, patient history, and epidemiological information. The expected result is Negative.  Fact Sheet for Patients:  BloggerCourse.comhttps://www.fda.gov/media/152166/download  Fact Sheet for Healthcare Providers:  SeriousBroker.ithttps://www.fda.gov/media/152162/download  This test is no t yet approved or  cleared by the [place] FDA and  has been authorized for detection and/or diagnosis of SARS-CoV-2 by FDA under an Emergency Use Authorization (EUA). This EUA will remain  in effect (meaning this test can be used) for the duration of the COVID-19 declaration under Section 564(b)(1) of the Act, 21 U.S.C.section 360bbb-3(b)(1), unless the authorization is terminated  or revoked sooner.       Influenza A by PCR NEGATIVE NEGATIVE Final   Influenza B by PCR NEGATIVE NEGATIVE Final    Comment: (NOTE) The Xpert Xpress SARS-CoV-2/FLU/RSV plus assay is intended as an aid in the diagnosis of influenza from Nasopharyngeal swab specimens and should not be used as a sole basis for treatment. Nasal washings and aspirates are unacceptable for Xpert Xpress SARS-CoV-2/FLU/RSV testing.  Fact Sheet for Patients: BloggerCourse.comhttps://www.fda.gov/media/152166/download  Fact Sheet for Healthcare Providers: SeriousBroker.ithttps://www.fda.gov/media/152162/download  This test is not yet approved or cleared by the [place] FDA and has been authorized for detection and/or diagnosis of SARS-CoV-2 by FDA under an Emergency Use Authorization (EUA). This EUA will remain in effect (meaning this test can be used) for the duration of the COVID-19 declaration under Section 564(b)(1) of the Act, 21 U.S.C. section 360bbb-3(b)(1), unless the authorization is terminated or revoked.  Performed at [facility], [address] [place], [place] [address]   Urine Culture     Status: Abnormal   Collection Time: 03/17/21  1:30 AM   Specimen: In/Out Cath Urine  Result Value Ref Range Status   Specimen Description   Final    IN/OUT CATH URINE Performed at [facility], [address] [place], [place] [address]    Special Requests   Final    NONE Performed at [facility], [address] [place], [place] [address]    Culture MULTIPLE SPECIES PRESENT, SUGGEST RECOLLECTION (A)   Final   Report Status 03/18/2021 FINAL  Final  Culture, blood (Routine x 2)     Status: None (Preliminary result)   Collection Time: 03/17/21  1:45 AM   Specimen: BLOOD  Result Value Ref Range Status   Specimen Description   Final    BLOOD RIGHT ANTECUBITAL Performed at [facility], [address] [place], [place] [address]    Special Requests   Final    BOTTLES DRAWN AEROBIC AND ANAEROBIC Blood Culture  adequate volume Performed at [facility], [address] [place], [place] [address]    Culture   Final    NO GROWTH 4 DAYS Performed at [facility], [address], [place], [place] [address]    Report Status PENDING  Incomplete  SARS CORONAVIRUS 2 (TAT 6-24 HRS) Nasopharyngeal Nasopharyngeal Swab     Status: None   Collection Time: 03/17/21  2:30 AM   Specimen: Nasopharyngeal Swab  Result Value Ref Range Status   SARS Coronavirus 2 NEGATIVE NEGATIVE Final    Comment: (NOTE) SARS-CoV-2 target nucleic acids are NOT DETECTED.  The SARS-CoV-2 RNA is generally detectable in upper and lower respiratory specimens during the acute phase of infection. Negative results do not preclude SARS-CoV-2 infection, do not rule out co-infections with other pathogens, and should not be used as the sole basis for treatment or other patient management decisions. Negative results must be combined with clinical observations, patient history, and epidemiological information. The expected result is Negative.  Fact Sheet for Patients: HairSlick.no  Fact Sheet for Healthcare Providers: quierodirigir.com  This test is not yet approved or cleared by the [place] FDA and  has been authorized for detection and/or diagnosis of SARS-CoV-2 by FDA under an Emergency Use Authorization (EUA). This EUA will remain  in effect (meaning this test can be used) for the duration of the COVID-19 declaration  under Se ction 564(b)(1) of the Act, 21 U.S.C. section 360bbb-3(b)(1), unless the authorization is terminated or revoked sooner.  Performed at [facility], [address] [place], [place] [address]   Culture, blood (Routine x 2)     Status: None (Preliminary result)   Collection Time: 03/17/21  4:05 AM   Specimen: BLOOD  Result Value Ref Range Status   Specimen Description   Final    BLOOD BLOOD LEFT FOREARM Performed at [facility], [address], [place], [place] [address]    Special Requests   Final    BOTTLES DRAWN AEROBIC AND ANAEROBIC Blood Culture adequate volume Performed at [facility], [address], [place], [place] [address]    Culture   Final    NO GROWTH 4 DAYS Performed at [facility], [address], [place], [place] [address]    Report Status PENDING  Incomplete  MRSA Next Gen by PCR, Nasal     Status: None   Collection Time: 03/19/21  2:16 PM   Specimen: Nasal Mucosa; Nasal Swab  Result Value Ref Range Status   MRSA by PCR Next Gen NOT DETECTED NOT DETECTED Final    Comment: (NOTE) The GeneXpert MRSA Assay (FDA approved for NASAL specimens only), is one component of a comprehensive MRSA colonization surveillance program. It is not intended to diagnose MRSA infection nor to guide or monitor treatment for MRSA infections. Test performance is not FDA approved in patients less than 48 years old. Performed at [facility], [address] [place], [place] [address]          Radiology Studies: DG CHEST PORT 1 VIEW  Result Date: 03/20/2021 CLINICAL DATA:  Shortness of breath. EXAM: PORTABLE CHEST 1 VIEW COMPARISON:  03/19/2021. FINDINGS: Stable cardiomegaly. Low lung volumes. Persistent bibasilar infiltrates/edema without interim change. No pleural effusion or pneumothorax. IMPRESSION: 1.  Stable cardiomegaly. 2. Low lung volumes. Persistent bibasilar infiltrates/edema thought interim  change. Electronically Signed   By: Maisie Fus  Register   On: 03/20/2021 07:16  Scheduled Meds:  amoxicillin-clavulanate  1 tablet Oral Q12H   haloperidol lactate  2 mg Intramuscular Once   heparin  5,000 Units Subcutaneous Q8H   iron polysaccharides  150 mg Oral Daily   pneumococcal 23 valent vaccine  0.5 mL Intramuscular Tomorrow-1000   sodium bicarbonate  650 mg Oral BID   Continuous Infusions:   LOS: 4 days    Time spent: 30 minutes    Dorcas Carrow, MD [facility] Pager [phone]

## 2021-03-22 DIAGNOSIS — G9340 Encephalopathy, unspecified: Secondary | ICD-10-CM | POA: Diagnosis not present

## 2021-03-22 DIAGNOSIS — G309 Alzheimer's disease, unspecified: Secondary | ICD-10-CM | POA: Diagnosis not present

## 2021-03-22 DIAGNOSIS — Z515 Encounter for palliative care: Secondary | ICD-10-CM | POA: Diagnosis not present

## 2021-03-22 DIAGNOSIS — Z7189 Other specified counseling: Secondary | ICD-10-CM | POA: Diagnosis not present

## 2021-03-22 DIAGNOSIS — R6520 Severe sepsis without septic shock: Secondary | ICD-10-CM | POA: Diagnosis not present

## 2021-03-22 DIAGNOSIS — R531 Weakness: Secondary | ICD-10-CM | POA: Diagnosis not present

## 2021-03-22 DIAGNOSIS — A419 Sepsis, unspecified organism: Secondary | ICD-10-CM | POA: Diagnosis not present

## 2021-03-22 LAB — CULTURE, BLOOD (ROUTINE X 2)
Culture: NO GROWTH
Culture: NO GROWTH
Special Requests: ADEQUATE
Special Requests: ADEQUATE

## 2021-03-22 MED ORDER — HALOPERIDOL 5 MG PO TABS: 5.0000 mg | ORAL_TABLET | Freq: Four times a day (QID) | ORAL | Status: DC | PRN

## 2021-03-22 NOTE — Care Management Important Message (Signed)
Important Message  Patient Details IM Letter given to Wife [name]. Name: Jacob Rich MRN: [id number] Date of Birth: [birth year]   Medicare Important Message Given:  Yes     [name] 03/22/2021, 3:09 PM

## 2021-03-22 NOTE — TOC Progression Note (Signed)
Transition of Care [facility]) - Progression Note    Patient Details  Name: Jacob Rich MRN: [id number] Date of Birth: 06-Aug-1931  Transition of [facility]) CM/SW Contact  Ida Rogue, [place] Phone Number: 03/22/2021, 11:48 AM  Clinical Narrative:   Received message from [facility] this AM stating that patient's wife had left a message stating she would no longer take him home, and to cancel the bed order.  I spoke with wife, let her know that her husband is medically stable and staying here is not an option.  She voiced understanding and promised she would call [facility] back to reinstate bed and service order.  Confirmed by Melissa.  We will d/c patient tomorrow to ensure that bed makes it before patient returns home. TOC will continue to follow during the course of hospitalization.     Expected Discharge Plan: Home/Self Care    Expected Discharge Plan and Services Expected Discharge Plan: Home/Self Care   Discharge Planning Services: CM Consult   Living arrangements for the past 2 months: Single Family Home                                       Social Determinants of Health (SDOH) Interventions    Readmission Risk Interventions No flowsheet data found.

## 2021-03-22 NOTE — Progress Notes (Signed)
PROGRESS NOTE    Jacob Rich  NGE:[id number] DOB: 01/23/1931 DOA: 03/16/2021 PCP: Renaye RakersBland, Veita, MD    Brief Narrative:  87 year old gentleman with history of advanced dementia, chronic kidney disease stage IIIb, history of MRSA infection as well as other multiple comorbidities brought to ER with lethargic and more confused than baseline.  Apparently, last few months his dementia has been worsening.  In the emergency room febrile and tachycardic.  Lactic acid 4.7.  Urine and blood cultures were sent and patient is started on IV antibiotics and fluid resuscitation.  Chest x-ray consistent with retrocardiac airspace opacity. Remains in the hospital with poor participation.   Assessment & Plan:   Principal Problem:   Sepsis (HCC) Active Problems:   Dementia with behavioral disturbance (HCC)   Acute encephalopathy   Chronic kidney disease, stage 3b (HCC)  Severe sepsis likely secondary to pneumonia present on admission, suspect aspiration pneumonia. Sepsis present with temperature 39.9, tachycardic and initial lactic acid of 4.7.  Improved. Treated with broad-spectrum antibiotics, repeat chest x-ray with left lower lobe opacity and right basilar opacity.  Completed antibiotics for 5 days.  Difficult to keep up IV line as well refusing oral medications.  Discontinue all antibiotics. Previously seen by speech therapy and remains on dysphagia 3 diet.  Acute encephalopathy, confusion in the context of underlying advanced dementia: Fall and delirium precautions. Patient has progressive debility and advancing dementia. Goal of care discussion held by palliative care team, decided to go home with home hospice.Currently appropriate for hospice at home with family support and in the future may need residential hospice.Manage agitation with oral medication including Haldol.  Electrolyte abnormalities including hypophosphatemia: Replaced.  CKD stage IIIb: At about baseline.  Monitor.  Appreciate  palliative and social worker help.  Hospice referral done.  Anticipate discharge home tomorrow with home hospice.  DVT prophylaxis: heparin injection 5,000 Units Start: 03/17/21 0600   Code Status: DNR Family Communication: Family with palliative care team Disposition Plan: Status is: Inpatient  Remains inpatient appropriate because:Inpatient level of care appropriate due to severity of illness  Dispo: The patient is from: Home              Anticipated d/c is to:  Home with home hospice              Patient currently is medically stable to transition home once hospice is in place.   Difficult to place patient No         Consultants:  Palliative care team  Procedures:  None  Antimicrobials:  Antibiotics Given (last 72 hours)     Date/Time Action Medication Dose Rate   03/20/21 1416 New Bag/Given   azithromycin (ZITHROMAX) 500 mg in sodium chloride 0.9 % 250 mL IVPB 500 mg 250 mL/hr   03/20/21 2040 New Bag/Given   cefTRIAXone (ROCEPHIN) 2 g in sodium chloride 0.9 % 100 mL IVPB 2 g 200 mL/hr   03/21/21 2100 Given   amoxicillin-clavulanate (AUGMENTIN) 875-125 MG per tablet 1 tablet 1 tablet    03/22/21 1233 Given   amoxicillin-clavulanate (AUGMENTIN) 875-125 MG per tablet 1 tablet 1 tablet           Subjective: Seen and examined.  No overnight events.  He tells me "I am back to my baseline".  Apparently he tells this to everybody.  He denies any ongoing problems.  Objective: Vitals:   03/21/21 1401 03/21/21 1950 03/22/21 0354 03/22/21 0419  BP: 138/64 132/65 116/66   Pulse: 72 69 74  Resp: 20 15 18    Temp: 98.8 F (37.1 C) 98.5 F (36.9 C) 98.7 F (37.1 C)   TempSrc: Oral Oral Oral   SpO2:  95% 95%   Weight:    65.4 kg  Height:        Intake/Output Summary (Last 24 hours) at 03/22/2021 1245 Last data filed at 03/22/2021 1001 Gross per 24 hour  Intake 118 ml  Output 750 ml  Net -632 ml   Filed Weights   03/19/21 0331 03/21/21 0436 03/22/21 0419   Weight: 67.1 kg 66.7 kg 65.4 kg    Examination:  General: Frail and debilitated.  Pleasant but confused.  Remains quiet and calm today. On room air.  Bilateral soft mittens present. Cardiovascular: S1-S2 normal.  Regular rate rhythm. Respiratory: No added sounds.  On room air. Gastrointestinal: Soft and nontender.  Bowel sounds present. Ext: No swelling or edema.  No cyanosis. Neuro: No focal deficits.     Data Reviewed: I have personally reviewed following labs and imaging studies  CBC: Recent Labs  Lab 03/15/21 2230 03/17/21 0145 03/18/21 0639 03/19/21 0956  WBC 5.6 10.0 3.7* 2.7*  NEUTROABS 4.6 8.8* 2.9 2.0  HGB 12.9* 15.4 10.6* 11.3*  HCT 38.6* 45.7 30.9* 33.1*  MCV 95.8 94.8 94.5 93.2  PLT 205 184 106* 117*   Basic Metabolic Panel: Recent Labs  Lab 03/15/21 2230 03/17/21 0145 03/18/21 0639 03/19/21 0956  NA 136 136 134* 136  K 3.9 4.3 3.7 3.6  CL 104 100 106 107  CO2 24 22 19* 18*  GLUCOSE 111* 141* 83 81  BUN 18 20 23 21   CREATININE 1.48* 1.76* 1.40* 1.40*  CALCIUM 9.0 9.3 7.9* 8.0*  MG  --   --  1.9 1.9  PHOS  --   --  2.2* 2.3*   GFR: Estimated Creatinine Clearance: 33.7 mL/min (A) (by C-G formula based on SCr of 1.4 mg/dL (H)). Liver Function Tests: Recent Labs  Lab 03/15/21 2230 03/17/21 0145 03/18/21 0639 03/19/21 0956  AST 21 36 56* 47*  ALT 12 18 22 21   ALKPHOS 81 89 50 52  BILITOT 1.2 1.5* 1.1 1.3*  PROT 6.6 7.6 4.7* 5.2*  ALBUMIN 3.9 4.2 2.6* 2.7*   No results for input(s): LIPASE, AMYLASE in the last 168 hours. No results for input(s): AMMONIA in the last 168 hours. Coagulation Profile: Recent Labs  Lab 03/17/21 0145  INR 1.1   Cardiac Enzymes: No results for input(s): CKTOTAL, CKMB, CKMBINDEX, TROPONINI in the last 168 hours. BNP (last 3 results) No results for input(s): PROBNP in the last 8760 hours. HbA1C: No results for input(s): HGBA1C in the last 72 hours. CBG: No results for input(s): GLUCAP in the last 168  hours. Lipid Profile: No results for input(s): CHOL, HDL, LDLCALC, TRIG, CHOLHDL, LDLDIRECT in the last 72 hours. Thyroid Function Tests: No results for input(s): TSH, T4TOTAL, FREET4, T3FREE, THYROIDAB in the last 72 hours. Anemia Panel: No results for input(s): VITAMINB12, FOLATE, FERRITIN, TIBC, IRON, RETICCTPCT in the last 72 hours. Sepsis Labs: Recent Labs  Lab 03/17/21 0137 03/17/21 0145 03/17/21 0405 03/17/21 0500 03/18/21 0639  PROCALCITON  --  0.53  --  4.48 10.60  LATICACIDVEN 4.7*  --  2.3*  --   --     Recent Results (from the past 240 hour(s))  Resp Panel by RT-PCR (Flu A&B, Covid) Nasopharyngeal Swab     Status: None   Collection Time: 03/15/21 10:03 PM   Specimen: Nasopharyngeal Swab; Nasopharyngeal(NP) swabs  in vial transport medium  Result Value Ref Range Status   SARS Coronavirus 2 by RT PCR NEGATIVE NEGATIVE Final    Comment: (NOTE) SARS-CoV-2 target nucleic acids are NOT DETECTED.  The SARS-CoV-2 RNA is generally detectable in upper respiratory specimens during the acute phase of infection. The lowest concentration of SARS-CoV-2 viral copies this assay can detect is 138 copies/mL. A negative result does not preclude SARS-Cov-2 infection and should not be used as the sole basis for treatment or other patient management decisions. A negative result may occur with  improper specimen collection/handling, submission of specimen other than nasopharyngeal swab, presence of viral mutation(s) within the areas targeted by this assay, and inadequate number of viral copies(<138 copies/mL). A negative result must be combined with clinical observations, patient history, and epidemiological information. The expected result is Negative.  Fact Sheet for Patients:  BloggerCourse.com  Fact Sheet for Healthcare Providers:  SeriousBroker.it  This test is no t yet approved or cleared by the [place] FDA and  has been  authorized for detection and/or diagnosis of SARS-CoV-2 by FDA under an Emergency Use Authorization (EUA). This EUA will remain  in effect (meaning this test can be used) for the duration of the COVID-19 declaration under Section 564(b)(1) of the Act, 21 U.S.C.section 360bbb-3(b)(1), unless the authorization is terminated  or revoked sooner.       Influenza A by PCR NEGATIVE NEGATIVE Final   Influenza B by PCR NEGATIVE NEGATIVE Final    Comment: (NOTE) The Xpert Xpress SARS-CoV-2/FLU/RSV plus assay is intended as an aid in the diagnosis of influenza from Nasopharyngeal swab specimens and should not be used as a sole basis for treatment. Nasal washings and aspirates are unacceptable for Xpert Xpress SARS-CoV-2/FLU/RSV testing.  Fact Sheet for Patients: BloggerCourse.com  Fact Sheet for Healthcare Providers: SeriousBroker.it  This test is not yet approved or cleared by the [place] FDA and has been authorized for detection and/or diagnosis of SARS-CoV-2 by FDA under an Emergency Use Authorization (EUA). This EUA will remain in effect (meaning this test can be used) for the duration of the COVID-19 declaration under Section 564(b)(1) of the Act, 21 U.S.C. section 360bbb-3(b)(1), unless the authorization is terminated or revoked.  Performed at [facility], [address] [place], [place] [address]   Urine Culture     Status: Abnormal   Collection Time: 03/17/21  1:30 AM   Specimen: In/Out Cath Urine  Result Value Ref Range Status   Specimen Description   Final    IN/OUT CATH URINE Performed at [facility], [address], [place], [place] [address]    Special Requests   Final    NONE Performed at [facility], [address], [place], [place] [address]    Culture MULTIPLE SPECIES PRESENT, SUGGEST RECOLLECTION (A)  Final   Report Status 03/18/2021 FINAL  Final   Culture, blood (Routine x 2)     Status: None   Collection Time: 03/17/21  1:45 AM   Specimen: BLOOD  Result Value Ref Range Status   Specimen Description   Final    BLOOD RIGHT ANTECUBITAL Performed at [facility], [address], [place], [place] [address]    Special Requests   Final    BOTTLES DRAWN AEROBIC AND ANAEROBIC Blood Culture adequate volume Performed at [facility], [address], [place], [place] [address]    Culture   Final    NO GROWTH 5 DAYS Performed  at [facility], [address] [place], [place] [address]    Report Status 03/22/2021 FINAL  Final  SARS CORONAVIRUS 2 (TAT 6-24 HRS) Nasopharyngeal Nasopharyngeal Swab     Status: None   Collection Time: 03/17/21  2:30 AM   Specimen: Nasopharyngeal Swab  Result Value Ref Range Status   SARS Coronavirus 2 NEGATIVE NEGATIVE Final    Comment: (NOTE) SARS-CoV-2 target nucleic acids are NOT DETECTED.  The SARS-CoV-2 RNA is generally detectable in upper and lower respiratory specimens during the acute phase of infection. Negative results do not preclude SARS-CoV-2 infection, do not rule out co-infections with other pathogens, and should not be used as the sole basis for treatment or other patient management decisions. Negative results must be combined with clinical observations, patient history, and epidemiological information. The expected result is Negative.  Fact Sheet for Patients: HairSlick.no  Fact Sheet for Healthcare Providers: quierodirigir.com  This test is not yet approved or cleared by the [place] FDA and  has been authorized for detection and/or diagnosis of SARS-CoV-2 by FDA under an Emergency Use Authorization (EUA). This EUA will remain  in effect (meaning this test can be used) for the duration of the COVID-19 declaration under Se ction 564(b)(1) of the Act, 21 U.S.C. section  360bbb-3(b)(1), unless the authorization is terminated or revoked sooner.  Performed at [facility], [address] [place], [place] [address]   Culture, blood (Routine x 2)     Status: None   Collection Time: 03/17/21  4:05 AM   Specimen: BLOOD  Result Value Ref Range Status   Specimen Description   Final    BLOOD BLOOD LEFT FOREARM Performed at [facility], [address] [place], [place] [address]    Special Requests   Final    BOTTLES DRAWN AEROBIC AND ANAEROBIC Blood Culture adequate volume Performed at [facility], [address] [place], [place] [address]    Culture   Final    NO GROWTH 5 DAYS Performed at [facility], [address] [place], [place] [address]    Report Status 03/22/2021 FINAL  Final  MRSA Next Gen by PCR, Nasal     Status: None   Collection Time: 03/19/21  2:16 PM   Specimen: Nasal Mucosa; Nasal Swab  Result Value Ref Range Status   MRSA by PCR Next Gen NOT DETECTED NOT DETECTED Final    Comment: (NOTE) The GeneXpert MRSA Assay (FDA approved for NASAL specimens only), is one component of a comprehensive MRSA colonization surveillance program. It is not intended to diagnose MRSA infection nor to guide or monitor treatment for MRSA infections. Test performance is not FDA approved in patients less than 69 years old. Performed at [facility], [address] [place], [place] [address]          Radiology Studies: No results found.      Scheduled Meds:  haloperidol lactate  2 mg Intramuscular Once   heparin  5,000 Units Subcutaneous Q8H   iron polysaccharides  150 mg Oral Daily   pneumococcal 23 valent vaccine  0.5 mL Intramuscular Tomorrow-1000   sodium bicarbonate  650 mg Oral BID   Continuous Infusions:   LOS: 5 days    Time spent: 30 minutes    Dorcas Carrow, MD [facility] Pager [phone]

## 2021-03-22 NOTE — Progress Notes (Signed)
[facility]  Notified by [facility] of patient/family request in [facility] Hospice services at home after discharge. Chart and patient information reviewed and hospice eligibility confirmed.   Spoke with [name] and [facility] about discharging home tomorrow because originally DME was declined but is now being reordered and delivered later today. [facility] bed, table, and bed extension)  Please send signed and completed DNR form home with patient. Please provide prescriptions for medications at discharge to ensure comfort until patient can be admitted onto hospice services.  Please feel free to call with any questions. Yolande Jolly, BSN, RN [phone]

## 2021-03-22 NOTE — Progress Notes (Signed)
PMT brief progress note. Resting in bed, no acute changes Chart reviewed, discussed with TRH MD as well as TOC colleague Appreciate hospice consultation and input.  BP 116/66   Pulse 74   Temp 98.7 F (37.1 C) (Oral)   Resp 18   Ht 6\' 4"  (1.93 m)   Wt 65.4 kg   SpO2 95%   BMI 17.55 kg/m  Frail elderly gentleman Appears thin and chronically ill, has been having functional decline as well as cognitive decline No acute distress Palliative performance scale 40%. 87 year old gentleman with advanced dementia, severe sepsis likely secondary to pneumonia present on admission, likely aspiration pneumonia.  Progressive debility and advancing dementia. Plan: Recommend home with hospice arrangements underway.  No further palliative team recommendations at this time. 15 minutes spent. 98 MD [place] palliative [phone].

## 2021-03-23 DIAGNOSIS — G9340 Encephalopathy, unspecified: Secondary | ICD-10-CM | POA: Diagnosis not present

## 2021-03-23 DIAGNOSIS — G301 Alzheimer's disease with late onset: Secondary | ICD-10-CM | POA: Diagnosis not present

## 2021-03-23 DIAGNOSIS — A419 Sepsis, unspecified organism: Secondary | ICD-10-CM | POA: Diagnosis not present

## 2021-03-23 DIAGNOSIS — N1832 Chronic kidney disease, stage 3b: Secondary | ICD-10-CM | POA: Diagnosis not present

## 2021-03-23 MED ORDER — HALOPERIDOL 2 MG PO TABS: 2.0000 mg | ORAL_TABLET | Freq: Four times a day (QID) | ORAL | 0 refills | Status: AC | PRN

## 2021-03-23 NOTE — Progress Notes (Signed)
Occupational Therapy Treatment Patient Details Name: Jacob Rich MRN: [id number] DOB: 08/06/1931 Today's Date: 03/23/2021    History of present illness patient is a 87 year old Caucasian male  who presented to the ED with lethargy admitted with sepsis d/t pna.  PMH: dementia and chronic renal insufficiency with a CKD stage IIIb, history of MRSA infection as well as other comorbidities.   At baseline he is usually confused but has been able to ambulate independently and perform many of his ADLs until last 2 to 3 months over which she had significant worsening in his cognition and his independence.   OT comments  Patient supine in bed with safety mittens on. Initially patient pleasant. Attempted to perform supine to sit transfer with patient to participate in out of bed self care task - but patient resistant to moving and found to be incontinent of BM and urine. Patient max assist to roll in bed for pericare - agitated and somewhat resistive with movement. Patient max assist for LB bathing and total assist for pericare. Patient verbally abusive and aggressively trying to stop therapist and nurse to clean him requiring soft mittens to be replaced for safety. Due to patient's dementia he could not be reasoned with. RN reports patient discharging home with hospice today.      Follow Up Recommendations  Supervision/Assistance - 24 hour    Equipment Recommendations  None recommended by OT    Recommendations for Other Services      Precautions / Restrictions Precautions Precautions: Fall Precaution Comments: gets agitated Restrictions Weight Bearing Restrictions: No       Mobility Bed Mobility Overal bed mobility: Needs Assistance Bed Mobility: Rolling Rolling: Max assist;+2 for safety/equipment              Transfers                      Balance                                           ADL either performed or assessed with clinical judgement   ADL  Overall ADL's : Needs assistance/impaired             Lower Body Bathing: Maximal assistance;Bed level       Lower Body Dressing: Total assistance;+2 for physical assistance;+2 for safety/equipment       Toileting- Clothing Manipulation and Hygiene: Total assistance;+2 for physical assistance;+2 for safety/equipment               Vision Patient Visual Report: No change from baseline     Perception     Praxis      Cognition Arousal/Alertness: Awake/alert Behavior During Therapy: Agitated Overall Cognitive Status: History of cognitive impairments - at baseline                                 General Comments: h/o dementia. follows one step commands with incr time        Exercises     Shoulder Instructions       General Comments      Pertinent Vitals/ Pain       Pain Assessment: Faces Faces Pain Scale: Hurts a little bit Pain Location: L shoulder with movement Pain Descriptors / Indicators: Grimacing;Guarding Pain Intervention(s): Monitored during session  Home Living  Prior Functioning/Environment              Frequency           Progress Toward Goals  OT Goals(current goals can now be found in the care plan section)  Progress towards OT goals: Not progressing toward goals - comment     Plan Other (comment);Discharge plan needs to be updated (not making progress, non participatory)    Co-evaluation                 AM-PAC OT "6 Clicks" Daily Activity     Outcome Measure   Help from another person eating meals?: A Little Help from another person taking care of personal grooming?: A Little Help from another person toileting, which includes using toliet, bedpan, or urinal?: Total Help from another person bathing (including washing, rinsing, drying)?: A Lot Help from another person to put on and taking off regular upper body clothing?: A Lot Help from  another person to put on and taking off regular lower body clothing?: Total 6 Click Score: 12    End of Session    OT Visit Diagnosis: Muscle weakness (generalized) (M62.81);Pain   Activity Tolerance Treatment limited secondary to agitation   Patient Left in bed;with call bell/phone within reach;with chair alarm set;with nursing/sitter in room   Nurse Communication Mobility status        Time: 5035-4656 OT Time Calculation (min): 17 min  Charges: OT General Charges $OT Visit: 1 Visit OT Treatments $Self Care/Home Management : 8-22 mins  Waldron Session, OTR/L Acute Care Rehab Services  Office [phone] Pager: [phone]    Kelli Churn 03/23/2021, 11:13 AM

## 2021-03-23 NOTE — Progress Notes (Signed)
  Speech Language Pathology Treatment: Dysphagia  Patient Details Name: Jacob Rich MRN: [id number] DOB: 01/21/1931 Today's Date: 03/23/2021 Time: 0141-0301 SLP Time Calculation (min) (ACUTE ONLY): 12 min  Assessment / Plan / Recommendation Clinical Impression  Pt seen to assess po tolerance and for compensation strategy education written for spouse as pt to dc today home.  He was found in bed, alert with clear voice stating he wanted to sleep.   SLP observed him consuming his medication given by RN with pudding and gingerale via straw.  NO indication of aspiration and pt able to self feed efficiently without cues.  Wrote precautions, compensations on handout for spouse including gustatory changes with dementia/dysphagia, importance of self feeding as able, etc.  No follow up indicated. All goals met.  Thanks for this referral.     HPI HPI: 87 year old Caucasian male  who presented to the ED with lethargy admitted with sepsis d/t pna.  PMH: dementia and chronic renal insufficiency with a CKD stage IIIb, history of MRSA infection as well as other comorbidities.   At baseline he is usually confused but has been able to ambulate independently and perform many of his ADLs until last 2 to 3 months over which she had significant worsening in his cognition and his independence.      SLP Plan  All goals met       Recommendations  Diet recommendations: Dysphagia 3 (mechanical soft);Thin liquid Liquids provided via: Straw Medication Administration: Crushed with puree (with chocolaet pudding) Supervision: Patient able to self feed Compensations: Slow rate;Small sips/bites Postural Changes and/or Swallow Maneuvers: Seated upright 90 degrees;Upright 30-60 min after meal                Oral Care Recommendations: Oral care BID Follow up Recommendations: 24 hour supervision/assistance SLP Visit Diagnosis: Dysphagia, unspecified (R13.10);Dysphagia, oral phase (R13.11) Plan: All goals met        GO              Jacob Lime, MS [facility] SLP Acute Rehab Services Office [phone] Pager [phone]   Jacob Rich 03/23/2021, 11:50 AM

## 2021-03-23 NOTE — Discharge Summary (Signed)
Physician Discharge Summary  Jacob Rich ZOX:[id number] DOB: 01/29/1931 DOA: 03/16/2021  PCP: Renaye Rakers, MD  Admit date: 03/16/2021 Discharge date: 03/23/2021  Admitted From: Home Disposition: Home with home hospice  Recommendations for Outpatient Follow-up:  As per hospice provider  Home Health: Not applicable Equipment/Devices: Hospital bed, oxygen, other DME is provided by hospice  Discharge Condition: Stable CODE STATUS: DNR Diet recommendation: Regular diet.  Aspiration precautions all the time.  Discharge summary: Advanced dementia, chronic kidney disease stage IIIb, multiple comorbidities brought to the ER with lethargy and confused from baseline.  On presentation lactic acid 4.7, febrile and tachycardic with temperature 39.9 and chest x-ray consistent with left lower lobe opacity.  He is admitted and treated as aspiration pneumonia.  Completed antibiotics.  Symptomatically improved.  Remains at very high aspiration risk.  Patient with progressive debility and advanced dementia, seen by palliative care.  Patient with recurrent hospitalization, now needing more palliation and support.  Decided to go home with home hospice arrangements. Currently his behavior is well controlled.  He will continue to take his home medications including donepezil, PPI. Only needing some symptom control for agitation at this moment, will prescribe haloperidol 2 mg every 6 hours as needed for anxiety and agitation. Patient will be seen by hospice provider at home, in the future he may need more symptom control that will be assessed by hospice team. Stable to discharge home today.    Discharge Diagnoses:  Principal Problem:   Sepsis (HCC) Active Problems:   Dementia with behavioral disturbance (HCC)   Acute encephalopathy   Chronic kidney disease, stage 3b [facility])    Discharge Instructions  Discharge Instructions     Diet general   Complete by: As directed    Aspiration precautions    Increase activity slowly   Complete by: As directed       Allergies as of 03/23/2021   No Known Allergies      Medication List     TAKE these medications    diphenhydrAMINE 25 MG tablet Commonly known as: BENADRYL Take 6.25 mg by mouth every 6 (six) hours as needed.   donepezil 10 MG tablet Commonly known as: ARICEPT Take 1 tablet (10 mg total) by mouth at bedtime. What changed: when to take this   haloperidol 2 MG tablet Commonly known as: HALDOL Take 1 tablet (2 mg total) by mouth every 6 (six) hours as needed for agitation.   pantoprazole 40 MG tablet Commonly known as: PROTONIX Take 1 tablet (40 mg total) by mouth daily.   triamcinolone ointment 0.1 % Commonly known as: KENALOG Apply 1 application topically 2 (two) times daily as needed. Back itching        No Known Allergies  Consultations: Palliative medicine   Procedures/Studies: CT Head Wo Contrast  Result Date: 03/15/2021 CLINICAL DATA:  Altered mental status. EXAM: CT HEAD WITHOUT CONTRAST TECHNIQUE: Contiguous axial images were obtained from the base of the skull through the vertex without intravenous contrast. COMPARISON:  June 11, 2020 FINDINGS: Brain: There is mild cerebral atrophy with widening of the extra-axial spaces and ventricular dilatation. There are areas of decreased attenuation within the white matter tracts of the supratentorial brain, consistent with microvascular disease changes. Vascular: No hyperdense vessel or unexpected calcification. Skull: Normal. Negative for fracture or focal lesion. Sinuses/Orbits: No acute finding. Other: None. IMPRESSION: 1. Generalized cerebral atrophy. 2. No acute intracranial abnormality. Electronically Signed   By: Aram Candela M.D.   On: 03/15/2021 22:17   DG  CHEST PORT 1 VIEW  Result Date: 03/20/2021 CLINICAL DATA:  Shortness of breath. EXAM: PORTABLE CHEST 1 VIEW COMPARISON:  03/19/2021. FINDINGS: Stable cardiomegaly. Low lung volumes. Persistent  bibasilar infiltrates/edema without interim change. No pleural effusion or pneumothorax. IMPRESSION: 1.  Stable cardiomegaly. 2. Low lung volumes. Persistent bibasilar infiltrates/edema thought interim change. Electronically Signed   By: Maisie Fus  Register   On: 03/20/2021 07:16   DG CHEST PORT 1 VIEW  Result Date: 03/19/2021 CLINICAL DATA:  Shortness of breath EXAM: PORTABLE CHEST 1 VIEW COMPARISON:  03/18/2021 FINDINGS: The heart is normal size. Mediastinal contours within normal limits. Left lower lobe airspace opacity and right basilar opacity again noted, unchanged. No effusions. No acute bony abnormality. IMPRESSION: Continued left lower lobe airspace opacity and right basilar opacity, not significantly changed. Findings concerning for pneumonia. Electronically Signed   By: Charlett Nose M.D.   On: 03/19/2021 08:10   DG CHEST PORT 1 VIEW  Result Date: 03/18/2021 CLINICAL DATA:  Dementia with chronic renal insufficiency. EXAM: PORTABLE CHEST 1 VIEW COMPARISON:  03/17/2021 FINDINGS: Stable cardiomediastinal contours. Persistent opacity within the lower left lung concerning for pneumonia. Right lung appears clear. Visualized osseous structures are unremarkable. IMPRESSION: Persistent opacity within the lower left lung concerning for pneumonia. Electronically Signed   By: Signa Kell M.D.   On: 03/18/2021 11:09   DG Chest Port 1 View  Result Date: 03/17/2021 CLINICAL DATA:  Patient here from home, brought back after discharge with complaints of fatigue. Wife unable to care for patient. patient poor historian. Hx dementia. EXAM: PORTABLE CHEST 1 VIEW COMPARISON:  Chest x-ray 03/15/2021, CT chest 06/27/2020 FINDINGS: The heart size and mediastinal contours are unchanged. Aortic calcification. Coarsened interstitial markings at the right base persistent. Retrocardiac airspace opacity with silhouetting off left hemidiaphragm. No pulmonary edema. No pleural effusion. No pneumothorax. No acute osseous  abnormality. Degenerative changes of the shoulders. Degenerative changes of the spine. IMPRESSION: Retrocardiac airspace opacity that may represent atelectasis versus infection/inflammation. Electronically Signed   By: Tish Frederickson M.D.   On: 03/17/2021 02:03   DG Chest Port 1 View  Result Date: 03/15/2021 CLINICAL DATA:  87 year old male with altered mental status EXAM: PORTABLE CHEST 1 VIEW COMPARISON:  Chest radiograph dated 06/26/2020 and CT dated 06/27/2020 FINDINGS: Bibasilar hazy densities, likely atelectasis. Atypical infiltrate is less likely but not excluded. Clinical correlation is recommended. There is mild chronic interstitial coarsening. No lobar consolidation, pleural effusion or pneumothorax. Top-normal cardiac size. No acute osseous pathology. IMPRESSION: Bibasilar hazy densities, likely atelectasis. Electronically Signed   By: Elgie Collard M.D.   On: 03/15/2021 22:22   (Echo, Carotid, EGD, Colonoscopy, ERCP)    Subjective: Patient seen and examined.  No overnight events.  He has soft mittens on, he is asking how to get rid of them.  Denies any complaints.  Pleasantly confused.   Discharge Exam: Vitals:   03/22/21 2048 03/23/21 0501  BP: (!) 123/54 (!) 120/58  Pulse: 79 66  Resp: 16 16  Temp: 98 F (36.7 C) 98.1 F (36.7 C)  SpO2:     Vitals:   03/22/21 1343 03/22/21 2048 03/23/21 0458 03/23/21 0501  BP: 117/63 (!) 123/54  (!) 120/58  Pulse: 72 79  66  Resp: Temp: 98.4 F (36.9 C) 98 F (36.7 C)  98.1 F (36.7 C)  TempSrc: Oral Oral  Oral  SpO2: 98%     Weight:   67.1 kg   Height:  General: Pt is alert, awake, not in acute distress frail and debilitated.  Pleasantly confused but looks comfortable. Cardiovascular: RRR, S1/S2 +, no rubs, no gallops Respiratory: CTA bilaterally, no wheezing, no rhonchi, no added sounds. Abdominal: Soft, NT, ND, bowel sounds + Extremities: no edema, no cyanosis    The results of significant diagnostics  from this hospitalization (including imaging, microbiology, ancillary and laboratory) are listed below for reference.     Microbiology: Recent Results (from the past 240 hour(s))  Resp Panel by RT-PCR (Flu A&B, Covid) Nasopharyngeal Swab     Status: None   Collection Time: 03/15/21 10:03 PM   Specimen: Nasopharyngeal Swab; Nasopharyngeal(NP) swabs in vial transport medium  Result Value Ref Range Status   SARS Coronavirus 2 by RT PCR NEGATIVE NEGATIVE Final    Comment: (NOTE) SARS-CoV-2 target nucleic acids are NOT DETECTED.  The SARS-CoV-2 RNA is generally detectable in upper respiratory specimens during the acute phase of infection. The lowest concentration of SARS-CoV-2 viral copies this assay can detect is 138 copies/mL. A negative result does not preclude SARS-Cov-2 infection and should not be used as the sole basis for treatment or other patient management decisions. A negative result may occur with  improper specimen collection/handling, submission of specimen other than nasopharyngeal swab, presence of viral mutation(s) within the areas targeted by this assay, and inadequate number of viral copies(<138 copies/mL). A negative result must be combined with clinical observations, patient history, and epidemiological information. The expected result is Negative.  Fact Sheet for Patients:  BloggerCourse.com  Fact Sheet for Healthcare Providers:  SeriousBroker.it  This test is no t yet approved or cleared by the [place] FDA and  has been authorized for detection and/or diagnosis of SARS-CoV-2 by FDA under an Emergency Use Authorization (EUA). This EUA will remain  in effect (meaning this test can be used) for the duration of the COVID-19 declaration under Section 564(b)(1) of the Act, 21 U.S.C.section 360bbb-3(b)(1), unless the authorization is terminated  or revoked sooner.       Influenza A by PCR NEGATIVE NEGATIVE  Final   Influenza B by PCR NEGATIVE NEGATIVE Final    Comment: (NOTE) The Xpert Xpress SARS-CoV-2/FLU/RSV plus assay is intended as an aid in the diagnosis of influenza from Nasopharyngeal swab specimens and should not be used as a sole basis for treatment. Nasal washings and aspirates are unacceptable for Xpert Xpress SARS-CoV-2/FLU/RSV testing.  Fact Sheet for Patients: BloggerCourse.com  Fact Sheet for Healthcare Providers: SeriousBroker.it  This test is not yet approved or cleared by the [place] FDA and has been authorized for detection and/or diagnosis of SARS-CoV-2 by FDA under an Emergency Use Authorization (EUA). This EUA will remain in effect (meaning this test can be used) for the duration of the COVID-19 declaration under Section 564(b)(1) of the Act, 21 U.S.C. section 360bbb-3(b)(1), unless the authorization is terminated or revoked.  Performed at [facility], [address], [place], [place] [address]   Urine Culture     Status: Abnormal   Collection Time: 03/17/21  1:30 AM   Specimen: In/Out Cath Urine  Result Value Ref Range Status   Specimen Description   Final    IN/OUT CATH URINE Performed at [facility], [address] [place], [place] [address]    Special Requests   Final    NONE Performed at [facility], [address] [place], [place] [address]    Culture MULTIPLE SPECIES PRESENT, SUGGEST RECOLLECTION (A)  Final  Report Status 03/18/2021 FINAL  Final  Culture, blood (Routine x 2)     Status: None   Collection Time: 03/17/21  1:45 AM   Specimen: BLOOD  Result Value Ref Range Status   Specimen Description   Final    BLOOD RIGHT ANTECUBITAL Performed at [facility], [address] [place], [place] [address]    Special Requests   Final    BOTTLES DRAWN AEROBIC AND ANAEROBIC Blood Culture adequate volume Performed  at [facility], [address] [place], [place] [address]    Culture   Final    NO GROWTH 5 DAYS Performed at [facility], [address] [place], [place] [address]    Report Status 03/22/2021 FINAL  Final  SARS CORONAVIRUS 2 (TAT 6-24 HRS) Nasopharyngeal Nasopharyngeal Swab     Status: None   Collection Time: 03/17/21  2:30 AM   Specimen: Nasopharyngeal Swab  Result Value Ref Range Status   SARS Coronavirus 2 NEGATIVE NEGATIVE Final    Comment: (NOTE) SARS-CoV-2 target nucleic acids are NOT DETECTED.  The SARS-CoV-2 RNA is generally detectable in upper and lower respiratory specimens during the acute phase of infection. Negative results do not preclude SARS-CoV-2 infection, do not rule out co-infections with other pathogens, and should not be used as the sole basis for treatment or other patient management decisions. Negative results must be combined with clinical observations, patient history, and epidemiological information. The expected result is Negative.  Fact Sheet for Patients: HairSlick.nohttps://www.fda.gov/media/138098/download  Fact Sheet for Healthcare Providers: quierodirigir.comhttps://www.fda.gov/media/138095/download  This test is not yet approved or cleared by the [place] FDA and  has been authorized for detection and/or diagnosis of SARS-CoV-2 by FDA under an Emergency Use Authorization (EUA). This EUA will remain  in effect (meaning this test can be used) for the duration of the COVID-19 declaration under Se ction 564(b)(1) of the Act, 21 U.S.C. section 360bbb-3(b)(1), unless the authorization is terminated or revoked sooner.  Performed at [facility], [address] [place], [place] [address]   Culture, blood (Routine x 2)     Status: None   Collection Time: 03/17/21  4:05 AM   Specimen: BLOOD  Result Value Ref Range Status   Specimen Description   Final    BLOOD BLOOD LEFT FOREARM Performed at [facility], [address] [place], [place] [address]    Special Requests   Final    BOTTLES DRAWN AEROBIC AND ANAEROBIC Blood Culture adequate volume Performed at [facility], [address] [place], [place] [address]    Culture   Final    NO GROWTH 5 DAYS Performed at [facility], [address] [place], [place] [address]    Report Status 03/22/2021 FINAL  Final  MRSA Next Gen by PCR, Nasal     Status: None   Collection Time: 03/19/21  2:16 PM   Specimen: Nasal Mucosa; Nasal Swab  Result Value Ref Range Status   MRSA by PCR Next Gen NOT DETECTED NOT DETECTED Final    Comment: (NOTE) The GeneXpert MRSA Assay (FDA approved for NASAL specimens only), is one component of a comprehensive MRSA colonization surveillance program. It is not intended to diagnose MRSA infection nor to guide or monitor treatment for MRSA infections. Test performance is not FDA approved in patients less than 85 years old. Performed at [facility], [address] [place], [place] [address]  Labs: BNP (last 3 results) No results for input(s): BNP in the last 8760 hours. Basic Metabolic Panel: Recent Labs  Lab 03/17/21 0145 03/18/21 0639 03/19/21 0956  NA 136 134* 136  K 4.3 3.7 3.6  CL 100 106 107  CO2 22 19* 18*  GLUCOSE 141* 83 81  BUN 20 23 21   CREATININE 1.76* 1.40* 1.40*  CALCIUM 9.3 7.9* 8.0*  MG  --  1.9 1.9  PHOS  --  2.2* 2.3*   Liver Function Tests: Recent Labs  Lab 03/17/21 0145 03/18/21 0639 03/19/21 0956  AST 36 56* 47*  ALT 18 22 21   ALKPHOS 89 50 52  BILITOT 1.5* 1.1 1.3*  PROT 7.6 4.7* 5.2*  ALBUMIN 4.2 2.6* 2.7*   No results for input(s): LIPASE, AMYLASE in the last 168 hours. No results for input(s): AMMONIA in the last 168 hours. CBC: Recent Labs  Lab 03/17/21 0145 03/18/21 0639 03/19/21 0956  WBC 10.0 3.7* 2.7*  NEUTROABS 8.8* 2.9 2.0  HGB 15.4 10.6* 11.3*  HCT 45.7 30.9* 33.1*  MCV 94.8 94.5 93.2  PLT 184 106* 117*    Cardiac Enzymes: No results for input(s): CKTOTAL, CKMB, CKMBINDEX, TROPONINI in the last 168 hours. BNP: Invalid input(s): POCBNP CBG: No results for input(s): GLUCAP in the last 168 hours. D-Dimer No results for input(s): DDIMER in the last 72 hours. Hgb A1c No results for input(s): HGBA1C in the last 72 hours. Lipid Profile No results for input(s): CHOL, HDL, LDLCALC, TRIG, CHOLHDL, LDLDIRECT in the last 72 hours. Thyroid function studies No results for input(s): TSH, T4TOTAL, T3FREE, THYROIDAB in the last 72 hours.  Invalid input(s): FREET3 Anemia work up No results for input(s): VITAMINB12, FOLATE, FERRITIN, TIBC, IRON, RETICCTPCT in the last 72 hours. Urinalysis    Component Value Date/Time   COLORURINE YELLOW 03/17/2021 0927   APPEARANCEUR HAZY (A) 03/17/2021 0927   LABSPEC 1.009 03/17/2021 0927   PHURINE 6.0 03/17/2021 0927   GLUCOSEU NEGATIVE 03/17/2021 0927   HGBUR SMALL (A) 03/17/2021 0927   BILIRUBINUR NEGATIVE 03/17/2021 0927   KETONESUR NEGATIVE 03/17/2021 0927   PROTEINUR NEGATIVE 03/17/2021 0927   NITRITE NEGATIVE 03/17/2021 0927   LEUKOCYTESUR TRACE (A) 03/17/2021 0927   Sepsis Labs Invalid input(s): PROCALCITONIN,  WBC,  LACTICIDVEN Microbiology Recent Results (from the past 240 hour(s))  Resp Panel by RT-PCR (Flu A&B, Covid) Nasopharyngeal Swab     Status: None   Collection Time: 03/15/21 10:03 PM   Specimen: Nasopharyngeal Swab; Nasopharyngeal(NP) swabs in vial transport medium  Result Value Ref Range Status   SARS Coronavirus 2 by RT PCR NEGATIVE NEGATIVE Final    Comment: (NOTE) SARS-CoV-2 target nucleic acids are NOT DETECTED.  The SARS-CoV-2 RNA is generally detectable in upper respiratory specimens during the acute phase of infection. The lowest concentration of SARS-CoV-2 viral copies this assay can detect is 138 copies/mL. A negative result does not preclude SARS-Cov-2 infection and should not be used as the sole basis for treatment  or other patient management decisions. A negative result may occur with  improper specimen collection/handling, submission of specimen other than nasopharyngeal swab, presence of viral mutation(s) within the areas targeted by this assay, and inadequate number of viral copies(<138 copies/mL). A negative result must be combined with clinical observations, patient history, and epidemiological information. The expected result is Negative.  Fact Sheet for Patients:  03/19/2021  Fact Sheet for Healthcare Providers:  03/17/21  This test is no t yet approved or cleared by the [place] FDA  and  has been authorized for detection and/or diagnosis of SARS-CoV-2 by FDA under an Emergency Use Authorization (EUA). This EUA will remain  in effect (meaning this test can be used) for the duration of the COVID-19 declaration under Section 564(b)(1) of the Act, 21 U.S.C.section 360bbb-3(b)(1), unless the authorization is terminated  or revoked sooner.       Influenza A by PCR NEGATIVE NEGATIVE Final   Influenza B by PCR NEGATIVE NEGATIVE Final    Comment: (NOTE) The Xpert Xpress SARS-CoV-2/FLU/RSV plus assay is intended as an aid in the diagnosis of influenza from Nasopharyngeal swab specimens and should not be used as a sole basis for treatment. Nasal washings and aspirates are unacceptable for Xpert Xpress SARS-CoV-2/FLU/RSV testing.  Fact Sheet for Patients: BloggerCourse.com  Fact Sheet for Healthcare Providers: SeriousBroker.it  This test is not yet approved or cleared by the [place] FDA and has been authorized for detection and/or diagnosis of SARS-CoV-2 by FDA under an Emergency Use Authorization (EUA). This EUA will remain in effect (meaning this test can be used) for the duration of the COVID-19 declaration under Section 564(b)(1) of the Act, 21 U.S.C. section  360bbb-3(b)(1), unless the authorization is terminated or revoked.  Performed at [facility], [address] [place], [place] [address]   Urine Culture     Status: Abnormal   Collection Time: 03/17/21  1:30 AM   Specimen: In/Out Cath Urine  Result Value Ref Range Status   Specimen Description   Final    IN/OUT CATH URINE Performed at [facility], [address] [place], [place] [address]    Special Requests   Final    NONE Performed at [facility], [address] [place], [place] [address]    Culture MULTIPLE SPECIES PRESENT, SUGGEST RECOLLECTION (A)  Final   Report Status 03/18/2021 FINAL  Final  Culture, blood (Routine x 2)     Status: None   Collection Time: 03/17/21  1:45 AM   Specimen: BLOOD  Result Value Ref Range Status   Specimen Description   Final    BLOOD RIGHT ANTECUBITAL Performed at [facility], [address] [place], [place] [address]    Special Requests   Final    BOTTLES DRAWN AEROBIC AND ANAEROBIC Blood Culture adequate volume Performed at [facility], [address], [place], [place] [address]    Culture   Final    NO GROWTH 5 DAYS Performed at [facility], [address], [place], [place] [address]    Report Status 03/22/2021 FINAL  Final  SARS CORONAVIRUS 2 (TAT 6-24 HRS) Nasopharyngeal Nasopharyngeal Swab     Status: None   Collection Time: 03/17/21  2:30 AM   Specimen: Nasopharyngeal Swab  Result Value Ref Range Status   SARS Coronavirus 2 NEGATIVE NEGATIVE Final    Comment: (NOTE) SARS-CoV-2 target nucleic acids are NOT DETECTED.  The SARS-CoV-2 RNA is generally detectable in upper and lower respiratory specimens during the acute phase of infection. Negative results do not preclude SARS-CoV-2 infection, do not rule out co-infections with other pathogens, and should not be used as the sole basis for treatment or other patient  management decisions. Negative results must be combined with clinical observations, patient history, and epidemiological information. The expected result is Negative.  Fact Sheet for Patients: HairSlick.no  Fact Sheet for Healthcare Providers: quierodirigir.com  This test is not yet approved or cleared by the [place] FDA and  has been authorized for detection and/or diagnosis of SARS-CoV-2 by FDA under an Emergency Use Authorization (EUA). This EUA will remain  in effect (meaning this test can be used) for the duration of the COVID-19 declaration under Se ction 564(b)(1) of the Act, 21 U.S.C. section 360bbb-3(b)(1), unless the authorization is terminated or revoked sooner.  Performed at [facility], [address] [place], [place] [address]   Culture, blood (Routine x 2)     Status: None   Collection Time: 03/17/21  4:05 AM   Specimen: BLOOD  Result Value Ref Range Status   Specimen Description   Final    BLOOD BLOOD LEFT FOREARM Performed at [facility], [address], [place], [place] [address]    Special Requests   Final    BOTTLES DRAWN AEROBIC AND ANAEROBIC Blood Culture adequate volume Performed at [facility], [address], [place], [place] [address]    Culture   Final    NO GROWTH 5 DAYS Performed at [facility], [address], [place], [place] [address]    Report Status 03/22/2021 FINAL  Final  MRSA Next Gen by PCR, Nasal     Status: None   Collection Time: 03/19/21  2:16 PM   Specimen: Nasal Mucosa; Nasal Swab  Result Value Ref Range Status   MRSA by PCR Next Gen NOT DETECTED NOT DETECTED Final    Comment: (NOTE) The GeneXpert MRSA Assay (FDA approved for NASAL specimens only), is one component of a comprehensive MRSA colonization surveillance program. It is not intended to diagnose MRSA infection nor to guide or monitor treatment for MRSA  infections. Test performance is not FDA approved in patients less than 53 years old. Performed at [facility], [address], [place], [place] [address]      Time coordinating discharge:  28 minutes  SIGNED:   Dorcas Carrow, MD  [facility] 03/23/2021, 9:25 AM

## 2021-03-23 NOTE — Progress Notes (Signed)
PT Cancellation Note  Patient Details Name: Jacob Rich MRN: [id number] DOB: October 08, 1930   Cancelled Treatment:    Reason Eval/Treat Not Completed: Other (comment). PT signing off, pt going home with Hospice.   [facility] 03/23/2021, 9:58 AM

## 2021-03-23 NOTE — TOC Transition Note (Signed)
Transition of Care [facility]) - CM/SW Discharge Note   Patient Details  Name: [name] MRN: [id number] Date of Birth: 07/27/1931  Transition of Care [facility]) CM/SW Contact:  Ida Rogue, LCSW Phone Number: 03/23/2021, 9:27 AM   Clinical Narrative:   Patient who is stable for discharge will return home with hospice support. [facility] alerted.  Spoke with wife, who confirmed delivery of bed and availability all day to receive patient.  PTAR arranged.  TOC sign off.    Final next level of care: Home w Hospice Care Barriers to Discharge: Barriers Resolved   Patient Goals and CMS Choice     Choice offered to / list presented to : Spouse  Discharge Placement                       Discharge Plan and Services   Discharge Planning Services: CM Consult                                 Social Determinants of Health (SDOH) Interventions     Readmission Risk Interventions No flowsheet data found.

## 2023-04-05 IMAGING — DX DG CHEST 1V PORT
1 series · 1 of 1 positions shown · non-contrast
Comparison: 03/17/2021

CLINICAL DATA: Dementia with chronic renal insufficiency.

EXAM:
PORTABLE CHEST 1 VIEW

[chest ap]
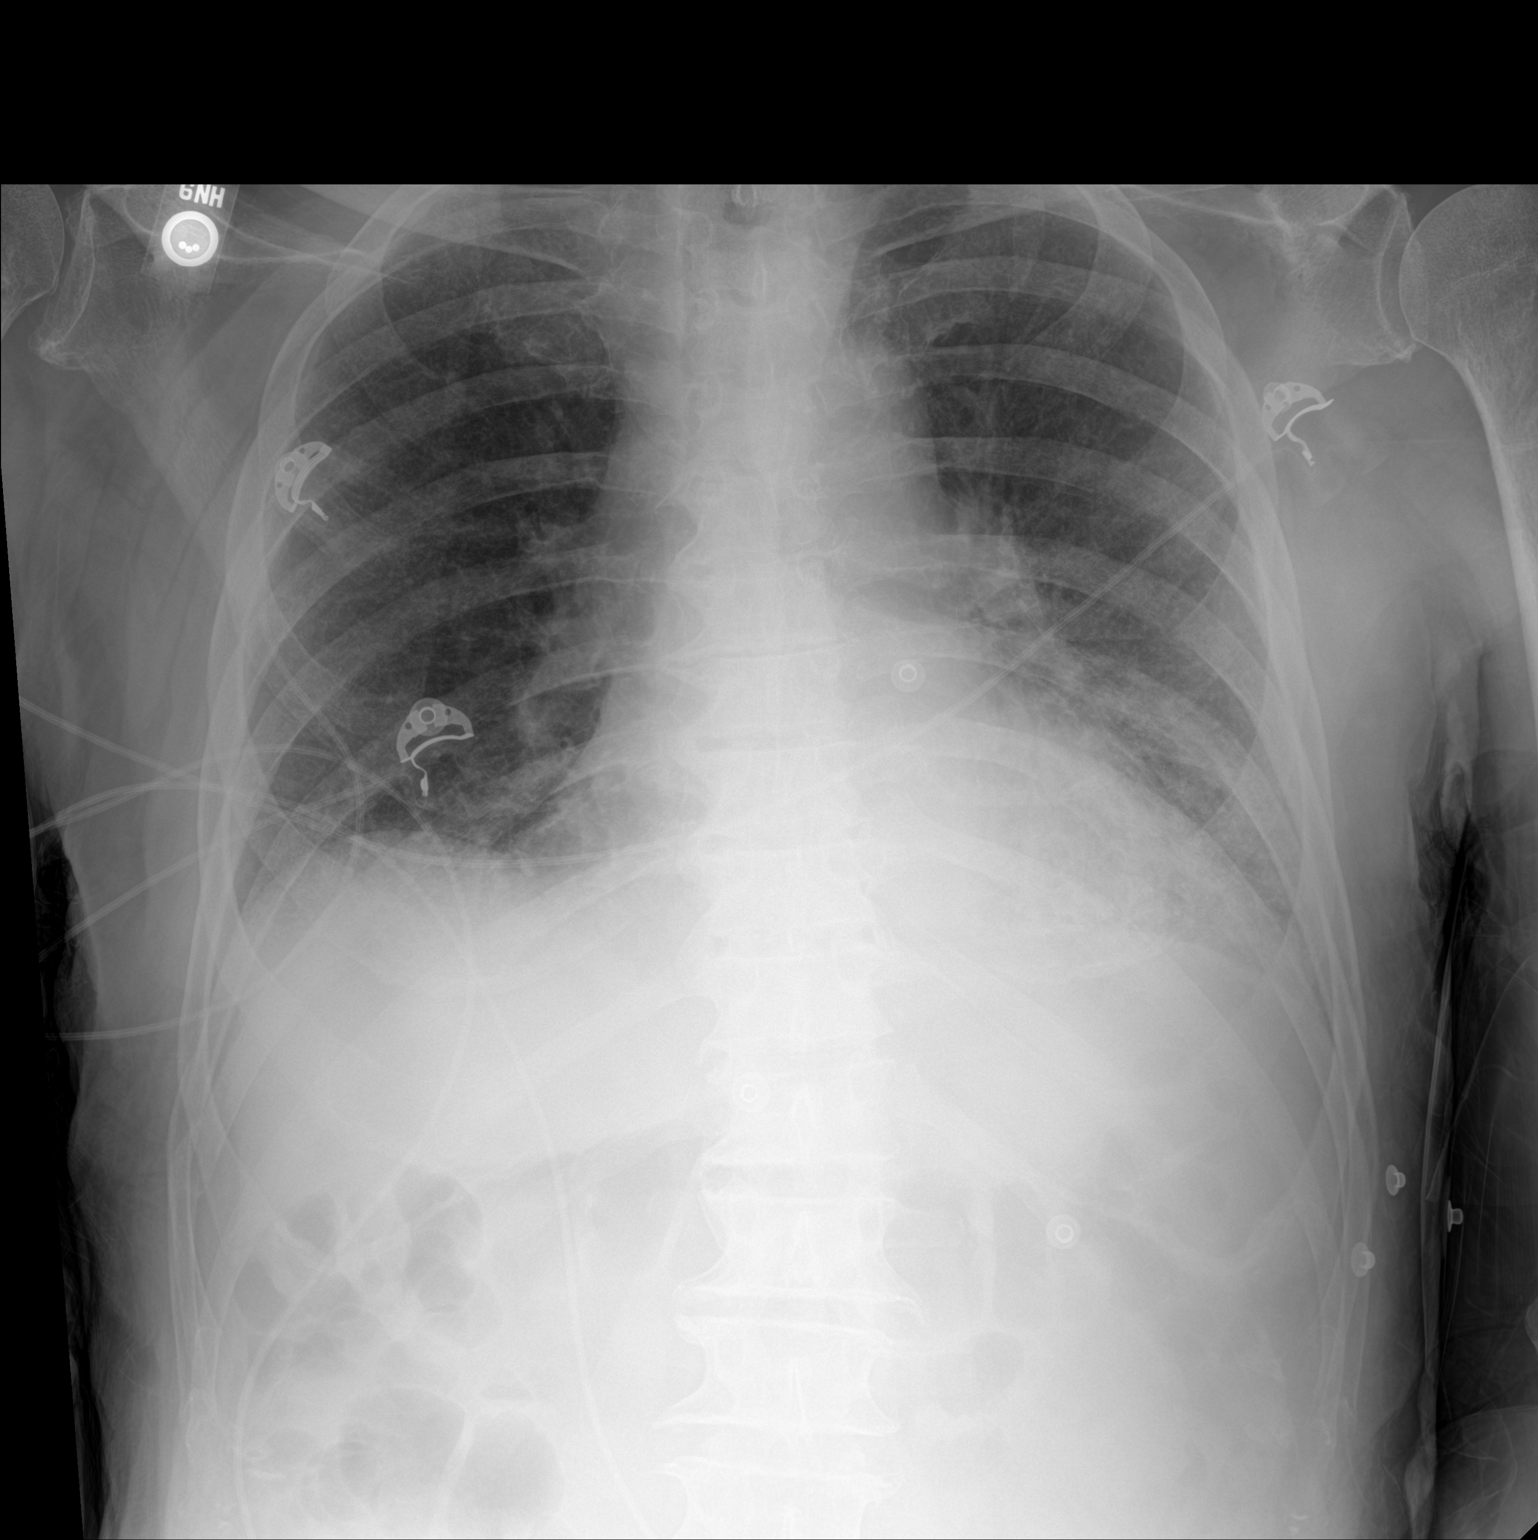

[1 of 1 positions shown; findings below may reference images not displayed]

FINDINGS: Stable cardiomediastinal contours. Persistent opacity within the
lower left lung concerning for pneumonia. Right lung appears clear.
Visualized osseous structures are unremarkable.
IMPRESSION: Persistent opacity within the lower left lung concerning for
pneumonia.

## 2023-04-06 IMAGING — DX DG CHEST 1V PORT
1 series · 1 of 1 positions shown · non-contrast
Comparison: 03/18/2021

CLINICAL DATA: Shortness of breath

EXAM:
PORTABLE CHEST 1 VIEW

[chest ap]
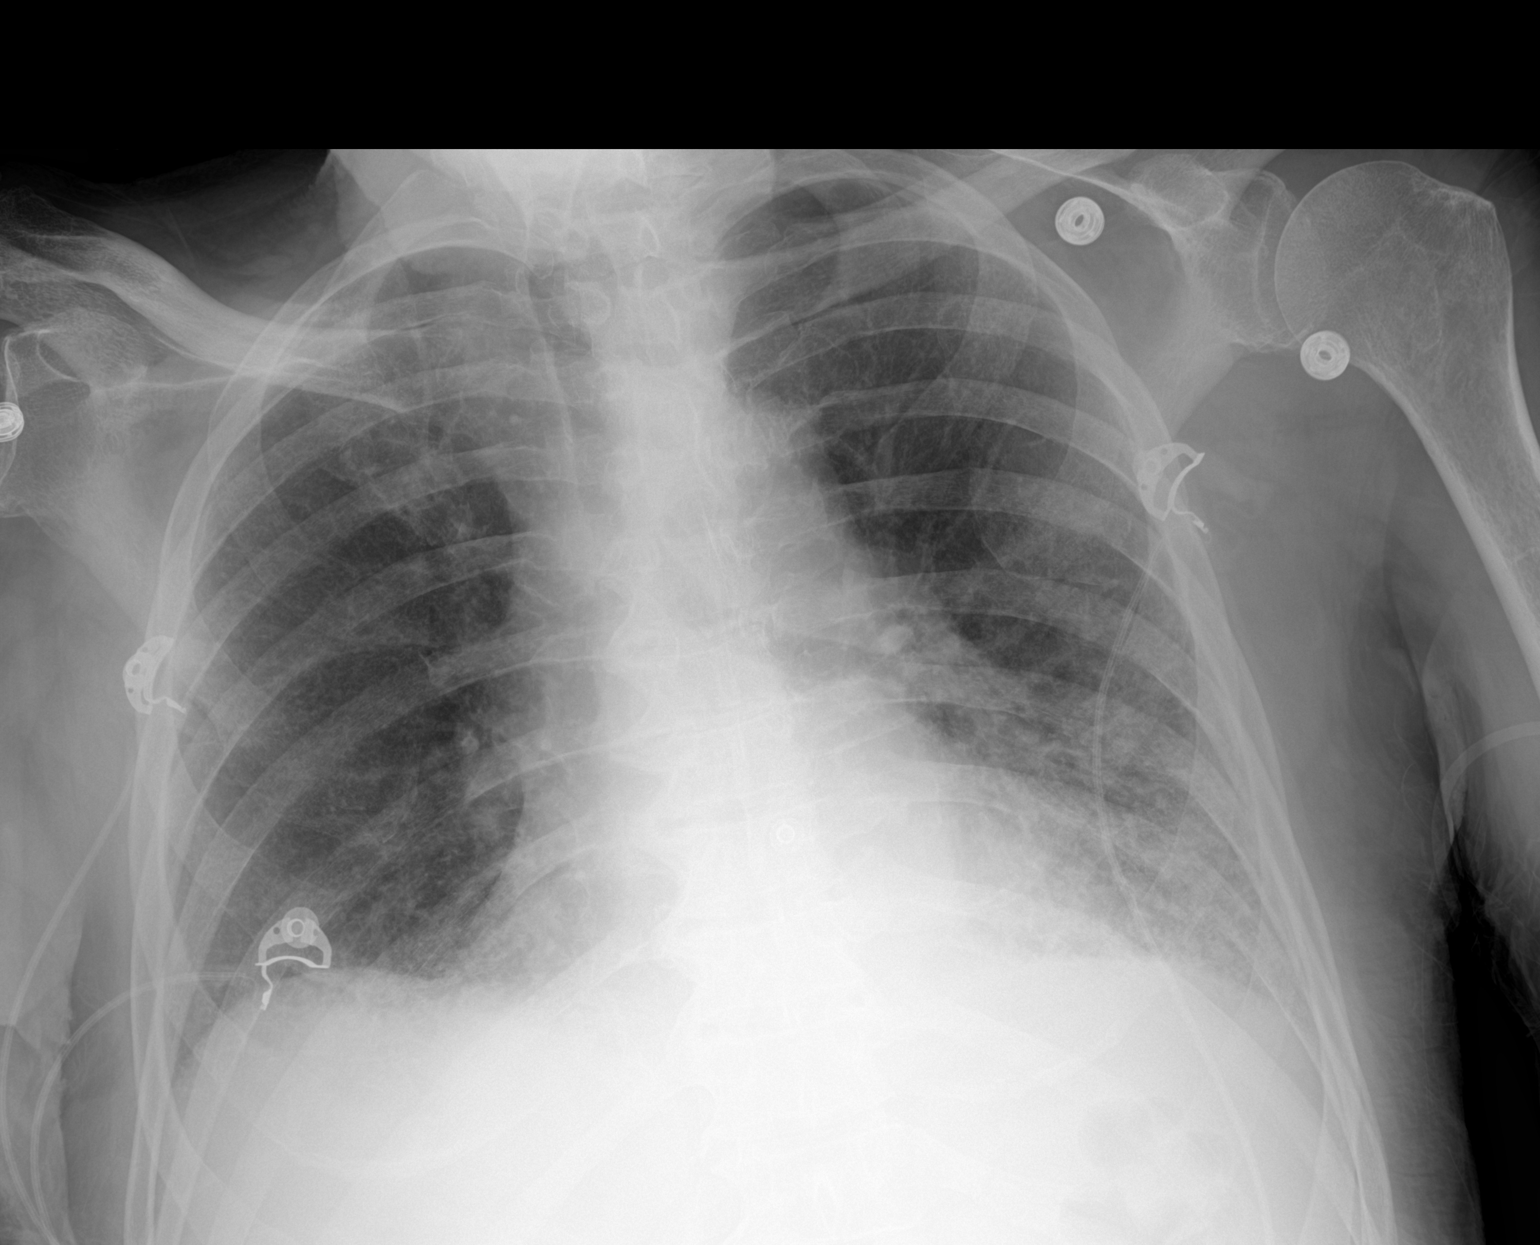

[1 of 1 positions shown; findings below may reference images not displayed]

FINDINGS: The heart is normal size. Mediastinal contours within normal limits.
Left lower lobe airspace opacity and right basilar opacity again
noted, unchanged. No effusions. No acute bony abnormality.
IMPRESSION: Continued left lower lobe airspace opacity and right basilar
opacity, not significantly changed. Findings concerning for
pneumonia.
